# Patient Record
Sex: Male | Born: 1980 | Race: Black or African American | Hispanic: No | Marital: Married | Smoking: Current every day smoker
Health system: Southern US, Community
[De-identification: ages and names within clinical notes are randomized; demographics above are authoritative.]

## PROBLEM LIST (undated history)

## (undated) DIAGNOSIS — Z969 Presence of functional implant, unspecified: Secondary | ICD-10-CM

## (undated) DIAGNOSIS — R569 Unspecified convulsions: Secondary | ICD-10-CM

## (undated) DIAGNOSIS — F32A Depression, unspecified: Secondary | ICD-10-CM

## (undated) DIAGNOSIS — W3400XA Accidental discharge from unspecified firearms or gun, initial encounter: Secondary | ICD-10-CM

## (undated) DIAGNOSIS — I10 Essential (primary) hypertension: Secondary | ICD-10-CM

## (undated) DIAGNOSIS — I639 Cerebral infarction, unspecified: Secondary | ICD-10-CM

## (undated) DIAGNOSIS — F1010 Alcohol abuse, uncomplicated: Secondary | ICD-10-CM

## (undated) DIAGNOSIS — R440 Auditory hallucinations: Secondary | ICD-10-CM

## (undated) DIAGNOSIS — F319 Bipolar disorder, unspecified: Secondary | ICD-10-CM

## (undated) DIAGNOSIS — R479 Unspecified speech disturbances: Secondary | ICD-10-CM

## (undated) DIAGNOSIS — F329 Major depressive disorder, single episode, unspecified: Secondary | ICD-10-CM

---

## 1998-02-27 ENCOUNTER — Emergency Department (HOSPITAL_COMMUNITY): Admission: EM | Admit: 1998-02-27 | Discharge: 1998-02-27 | Payer: Self-pay | Admitting: Emergency Medicine

## 1998-07-30 ENCOUNTER — Emergency Department (HOSPITAL_COMMUNITY): Admission: EM | Admit: 1998-07-30 | Discharge: 1998-07-30 | Payer: Self-pay | Admitting: Emergency Medicine

## 1998-12-26 ENCOUNTER — Encounter: Payer: Self-pay | Admitting: Emergency Medicine

## 1998-12-26 ENCOUNTER — Emergency Department (HOSPITAL_COMMUNITY): Admission: EM | Admit: 1998-12-26 | Discharge: 1998-12-26 | Payer: Self-pay | Admitting: Emergency Medicine

## 1999-01-09 ENCOUNTER — Encounter: Admission: RE | Admit: 1999-01-09 | Discharge: 1999-01-09 | Payer: Self-pay | Admitting: *Deleted

## 1999-01-16 ENCOUNTER — Emergency Department (HOSPITAL_COMMUNITY): Admission: EM | Admit: 1999-01-16 | Discharge: 1999-01-16 | Payer: Self-pay | Admitting: *Deleted

## 1999-02-26 ENCOUNTER — Emergency Department (HOSPITAL_COMMUNITY): Admission: EM | Admit: 1999-02-26 | Discharge: 1999-02-26 | Payer: Self-pay | Admitting: Emergency Medicine

## 1999-04-09 ENCOUNTER — Encounter: Payer: Self-pay | Admitting: Emergency Medicine

## 1999-04-09 ENCOUNTER — Emergency Department (HOSPITAL_COMMUNITY): Admission: EM | Admit: 1999-04-09 | Discharge: 1999-04-09 | Payer: Self-pay | Admitting: Emergency Medicine

## 1999-06-20 ENCOUNTER — Encounter: Payer: Self-pay | Admitting: Emergency Medicine

## 1999-06-20 ENCOUNTER — Emergency Department (HOSPITAL_COMMUNITY): Admission: EM | Admit: 1999-06-20 | Discharge: 1999-06-21 | Payer: Self-pay | Admitting: Emergency Medicine

## 1999-07-16 ENCOUNTER — Emergency Department (HOSPITAL_COMMUNITY): Admission: EM | Admit: 1999-07-16 | Discharge: 1999-07-17 | Payer: Self-pay | Admitting: Emergency Medicine

## 1999-07-17 ENCOUNTER — Encounter: Payer: Self-pay | Admitting: Emergency Medicine

## 1999-08-06 ENCOUNTER — Encounter: Payer: Self-pay | Admitting: Emergency Medicine

## 1999-08-06 ENCOUNTER — Emergency Department (HOSPITAL_COMMUNITY): Admission: EM | Admit: 1999-08-06 | Discharge: 1999-08-06 | Payer: Self-pay | Admitting: Emergency Medicine

## 1999-08-20 ENCOUNTER — Encounter: Admission: RE | Admit: 1999-08-20 | Discharge: 1999-08-20 | Payer: Self-pay | Admitting: Internal Medicine

## 1999-12-04 ENCOUNTER — Encounter: Payer: Self-pay | Admitting: Emergency Medicine

## 1999-12-04 ENCOUNTER — Emergency Department (HOSPITAL_COMMUNITY): Admission: EM | Admit: 1999-12-04 | Discharge: 1999-12-04 | Payer: Self-pay | Admitting: *Deleted

## 2002-06-01 ENCOUNTER — Emergency Department (HOSPITAL_COMMUNITY): Admission: EM | Admit: 2002-06-01 | Discharge: 2002-06-01 | Payer: Self-pay | Admitting: Emergency Medicine

## 2002-06-01 ENCOUNTER — Encounter: Payer: Self-pay | Admitting: Emergency Medicine

## 2002-07-11 ENCOUNTER — Encounter: Payer: Self-pay | Admitting: Emergency Medicine

## 2002-07-11 ENCOUNTER — Emergency Department (HOSPITAL_COMMUNITY): Admission: AC | Admit: 2002-07-11 | Discharge: 2002-07-11 | Payer: Self-pay

## 2005-09-27 ENCOUNTER — Emergency Department (HOSPITAL_COMMUNITY): Admission: EM | Admit: 2005-09-27 | Discharge: 2005-09-27 | Payer: Self-pay | Admitting: Emergency Medicine

## 2006-01-03 ENCOUNTER — Emergency Department (HOSPITAL_COMMUNITY): Admission: EM | Admit: 2006-01-03 | Discharge: 2006-01-03 | Payer: Self-pay | Admitting: Emergency Medicine

## 2006-11-10 ENCOUNTER — Emergency Department (HOSPITAL_COMMUNITY): Admission: EM | Admit: 2006-11-10 | Discharge: 2006-11-11 | Payer: Self-pay | Admitting: Emergency Medicine

## 2007-01-10 ENCOUNTER — Emergency Department (HOSPITAL_COMMUNITY): Admission: EM | Admit: 2007-01-10 | Discharge: 2007-01-10 | Payer: Self-pay | Admitting: Family Medicine

## 2009-03-22 ENCOUNTER — Emergency Department (HOSPITAL_COMMUNITY): Admission: EM | Admit: 2009-03-22 | Discharge: 2009-03-22 | Payer: Self-pay | Admitting: Emergency Medicine

## 2009-04-28 ENCOUNTER — Emergency Department (HOSPITAL_COMMUNITY): Admission: EM | Admit: 2009-04-28 | Discharge: 2009-04-29 | Payer: Self-pay | Admitting: *Deleted

## 2009-05-02 ENCOUNTER — Emergency Department (HOSPITAL_COMMUNITY): Admission: EM | Admit: 2009-05-02 | Discharge: 2009-05-02 | Payer: Self-pay | Admitting: Family Medicine

## 2010-08-11 ENCOUNTER — Emergency Department (HOSPITAL_COMMUNITY): Admission: EM | Admit: 2010-08-11 | Discharge: 2010-08-12 | Payer: Self-pay

## 2010-12-04 ENCOUNTER — Emergency Department (HOSPITAL_COMMUNITY)
Admission: EM | Admit: 2010-12-04 | Discharge: 2010-12-04 | Payer: Medicaid Other | Source: Home / Self Care | Admitting: Emergency Medicine

## 2011-01-30 LAB — POCT I-STAT, CHEM 8
Calcium, Ion: 0.94 mmol/L — ABNORMAL LOW (ref 1.12–1.32)
Creatinine, Ser: 1.3 mg/dL (ref 0.4–1.5)
Hemoglobin: 15.6 g/dL (ref 13.0–17.0)
Sodium: 145 meq/L (ref 135–145)
TCO2: 23 mmol/L (ref 0–100)

## 2011-01-30 LAB — RAPID URINE DRUG SCREEN, HOSP PERFORMED
Amphetamines: NOT DETECTED
Barbiturates: NOT DETECTED
Tetrahydrocannabinol: NOT DETECTED

## 2011-01-30 LAB — URINALYSIS, ROUTINE W REFLEX MICROSCOPIC
Bilirubin Urine: NEGATIVE
Hgb urine dipstick: NEGATIVE
Ketones, ur: NEGATIVE mg/dL
Protein, ur: NEGATIVE mg/dL
Urobilinogen, UA: 0.2 mg/dL (ref 0.0–1.0)

## 2011-01-30 LAB — DIFFERENTIAL
Basophils Absolute: 0 10*3/uL (ref 0.0–0.1)
Eosinophils Relative: 1 % (ref 0–5)
Lymphocytes Relative: 36 % (ref 12–46)
Neutro Abs: 3.6 10*3/uL (ref 1.7–7.7)

## 2011-01-30 LAB — CBC
Platelets: 211 10*3/uL (ref 150–400)
RDW: 13.9 % (ref 11.5–15.5)
WBC: 6.6 10*3/uL (ref 4.0–10.5)

## 2011-01-30 LAB — APTT: aPTT: 29 s (ref 24–37)

## 2011-01-30 LAB — ETHANOL: Alcohol, Ethyl (B): 369 mg/dL — ABNORMAL HIGH (ref 0–10)

## 2011-01-30 LAB — PROTIME-INR: Prothrombin Time: 13.1 s (ref 11.6–15.2)

## 2011-02-24 LAB — GC/CHLAMYDIA PROBE AMP, GENITAL
Chlamydia, DNA Probe: NEGATIVE
Chlamydia, DNA Probe: POSITIVE — AB
GC Probe Amp, Genital: NEGATIVE
GC Probe Amp, Genital: POSITIVE — AB

## 2011-04-04 ENCOUNTER — Emergency Department (HOSPITAL_COMMUNITY)
Admission: EM | Admit: 2011-04-04 | Discharge: 2011-04-04 | Disposition: A | Discharge status: Home / Self Care | Payer: Self-pay | Attending: Emergency Medicine | Admitting: Emergency Medicine

## 2011-04-04 DIAGNOSIS — F1010 Alcohol abuse, uncomplicated: Secondary | ICD-10-CM | POA: Insufficient documentation

## 2011-04-04 DIAGNOSIS — R4182 Altered mental status, unspecified: Secondary | ICD-10-CM | POA: Insufficient documentation

## 2011-04-04 LAB — COMPREHENSIVE METABOLIC PANEL
ALT: 24 U/L (ref 0–53)
Alkaline Phosphatase: 77 U/L (ref 39–117)
BUN: 15 mg/dL (ref 6–23)
CO2: 19 mEq/L (ref 19–32)
Calcium: 8.7 mg/dL (ref 8.4–10.5)
GFR calc non Af Amer: >60 mL/min (ref >60)
Glucose, Bld: 76 mg/dL (ref 70–99)
Total Protein: 7.5 g/dL (ref 6.0–8.3)

## 2011-04-04 LAB — CBC
HCT: 40.4 % (ref 39.0–52.0)
Hemoglobin: 13.8 g/dL (ref 13.0–17.0)
MCHC: 34.2 g/dL (ref 30.0–36.0)
MCV: 87.3 fL (ref 78.0–100.0)
RDW: 13.8 % (ref 11.5–15.5)

## 2011-04-04 LAB — DIFFERENTIAL
Eosinophils Relative: 3 % (ref 0–5)
Lymphocytes Relative: 28 % (ref 12–46)
Lymphs Abs: 2 10*3/uL (ref 0.7–4.0)
Monocytes Absolute: 0.7 10*3/uL (ref 0.1–1.0)
Monocytes Relative: 10 % (ref 3–12)
Neutro Abs: 4.1 10*3/uL (ref 1.7–7.7)

## 2011-04-04 LAB — ETHANOL: Alcohol, Ethyl (B): 313 mg/dL — ABNORMAL HIGH (ref 0–10)

## 2011-04-25 ENCOUNTER — Inpatient Hospital Stay (INDEPENDENT_AMBULATORY_CARE_PROVIDER_SITE_OTHER)
Admission: RE | Admit: 2011-04-25 | Discharge: 2011-04-25 | Disposition: A | Discharge status: Home / Self Care | Payer: Self-pay | Source: Ambulatory Visit | Attending: Family Medicine | Admitting: Family Medicine

## 2011-04-25 DIAGNOSIS — H6120 Impacted cerumen, unspecified ear: Secondary | ICD-10-CM

## 2011-07-20 ENCOUNTER — Emergency Department (HOSPITAL_COMMUNITY): Payer: Self-pay

## 2011-07-20 ENCOUNTER — Emergency Department (HOSPITAL_COMMUNITY)
Admission: EM | Admit: 2011-07-20 | Discharge: 2011-07-20 | Disposition: A | Discharge status: Home / Self Care | Payer: No Typology Code available for payment source | Attending: General Surgery | Admitting: General Surgery

## 2011-07-20 DIAGNOSIS — M25559 Pain in unspecified hip: Secondary | ICD-10-CM | POA: Insufficient documentation

## 2011-07-20 DIAGNOSIS — M546 Pain in thoracic spine: Secondary | ICD-10-CM | POA: Insufficient documentation

## 2011-07-20 DIAGNOSIS — M25519 Pain in unspecified shoulder: Secondary | ICD-10-CM | POA: Insufficient documentation

## 2011-07-20 DIAGNOSIS — I10 Essential (primary) hypertension: Secondary | ICD-10-CM | POA: Insufficient documentation

## 2011-07-20 DIAGNOSIS — IMO0002 Reserved for concepts with insufficient information to code with codable children: Secondary | ICD-10-CM | POA: Insufficient documentation

## 2011-07-20 DIAGNOSIS — S20219A Contusion of unspecified front wall of thorax, initial encounter: Secondary | ICD-10-CM | POA: Insufficient documentation

## 2011-07-20 DIAGNOSIS — M542 Cervicalgia: Secondary | ICD-10-CM | POA: Insufficient documentation

## 2011-07-20 DIAGNOSIS — R079 Chest pain, unspecified: Secondary | ICD-10-CM | POA: Insufficient documentation

## 2011-07-20 DIAGNOSIS — R109 Unspecified abdominal pain: Secondary | ICD-10-CM | POA: Insufficient documentation

## 2011-07-20 DIAGNOSIS — S7000XA Contusion of unspecified hip, initial encounter: Secondary | ICD-10-CM | POA: Insufficient documentation

## 2011-07-20 DIAGNOSIS — S0990XA Unspecified injury of head, initial encounter: Secondary | ICD-10-CM | POA: Insufficient documentation

## 2011-07-20 DIAGNOSIS — R51 Headache: Secondary | ICD-10-CM | POA: Insufficient documentation

## 2011-07-20 LAB — PROTIME-INR
INR: 0.96 (ref 0.00–1.49)
Prothrombin Time: 13 seconds (ref 11.6–15.2)

## 2011-07-20 LAB — URINALYSIS, ROUTINE W REFLEX MICROSCOPIC
Bilirubin Urine: NEGATIVE
Glucose, UA: NEGATIVE mg/dL
Ketones, ur: NEGATIVE mg/dL
Leukocytes, UA: NEGATIVE
Protein, ur: NEGATIVE mg/dL
pH: 5 (ref 5.0–8.0)

## 2011-07-20 LAB — POCT I-STAT, CHEM 8
BUN: 10 mg/dL (ref 6–23)
Creatinine, Ser: 1.5 mg/dL — ABNORMAL HIGH (ref 0.50–1.35)
Glucose, Bld: 97 mg/dL (ref 70–99)
Hemoglobin: 13.6 g/dL (ref 13.0–17.0)
Potassium: 3.8 meq/L (ref 3.5–5.1)
TCO2: 22 mmol/L (ref 0–100)

## 2011-07-20 LAB — COMPREHENSIVE METABOLIC PANEL
ALT: 25 U/L (ref 0–53)
AST: 41 U/L — ABNORMAL HIGH (ref 0–37)
Albumin: 4.1 g/dL (ref 3.5–5.2)
CO2: 25 mEq/L (ref 19–32)
Calcium: 8.4 mg/dL (ref 8.4–10.5)
Creatinine, Ser: 0.91 mg/dL (ref 0.50–1.35)
GFR calc non Af Amer: >60 mL/min (ref >60)
Sodium: 140 mEq/L (ref 135–145)
Total Protein: 7.1 g/dL (ref 6.0–8.3)

## 2011-07-20 LAB — CBC
Hemoglobin: 12.7 g/dL — ABNORMAL LOW (ref 13.0–17.0)
MCH: 31.8 pg (ref 26.0–34.0)
MCHC: 35.8 g/dL (ref 30.0–36.0)
MCV: 88.8 fL (ref 78.0–100.0)

## 2011-07-20 LAB — LACTIC ACID, PLASMA: Lactic Acid, Venous: 1.2 mmol/L (ref 0.5–2.2)

## 2011-07-20 LAB — URINE MICROSCOPIC-ADD ON

## 2011-07-20 LAB — ETHANOL: Alcohol, Ethyl (B): 329 mg/dL — ABNORMAL HIGH (ref 0–11)

## 2011-07-20 MED ORDER — IOHEXOL 300 MG/ML  SOLN
100.0000 mL | Freq: Once | INTRAMUSCULAR | Status: AC | PRN
  Administered 2011-07-20: 100 mL via INTRAVENOUS

## 2011-11-25 ENCOUNTER — Encounter: Payer: Self-pay | Admitting: Emergency Medicine

## 2011-11-25 ENCOUNTER — Emergency Department (HOSPITAL_COMMUNITY)
Admission: EM | Admit: 2011-11-25 | Discharge: 2011-11-26 | Disposition: A | Discharge status: Home / Self Care | Payer: Self-pay | Attending: Emergency Medicine | Admitting: Emergency Medicine

## 2011-11-25 DIAGNOSIS — M79609 Pain in unspecified limb: Secondary | ICD-10-CM | POA: Insufficient documentation

## 2011-11-25 DIAGNOSIS — S60222A Contusion of left hand, initial encounter: Secondary | ICD-10-CM

## 2011-11-25 DIAGNOSIS — S60229A Contusion of unspecified hand, initial encounter: Secondary | ICD-10-CM | POA: Insufficient documentation

## 2011-11-25 DIAGNOSIS — W2209XA Striking against other stationary object, initial encounter: Secondary | ICD-10-CM | POA: Insufficient documentation
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
## 2011-11-26 ENCOUNTER — Emergency Department (HOSPITAL_COMMUNITY): Payer: Self-pay

## 2011-11-26 NOTE — ED Provider Notes (Signed)
Medical screening examination/treatment/procedure(s) were performed by non-physician practitioner and as supervising physician I was immediately available for consultation/collaboration.   Vida Roller, MD 11/26/11 2229
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
## 2012-02-23 ENCOUNTER — Emergency Department (HOSPITAL_COMMUNITY)
Admission: EM | Admit: 2012-02-23 | Discharge: 2012-02-23 | Disposition: A | Discharge status: Home / Self Care | Payer: No Typology Code available for payment source | Attending: Emergency Medicine | Admitting: Emergency Medicine

## 2012-02-23 ENCOUNTER — Encounter (HOSPITAL_COMMUNITY): Payer: Self-pay | Admitting: Emergency Medicine

## 2012-02-23 DIAGNOSIS — Y9289 Other specified places as the place of occurrence of the external cause: Secondary | ICD-10-CM | POA: Insufficient documentation

## 2012-02-23 DIAGNOSIS — S139XXA Sprain of joints and ligaments of unspecified parts of neck, initial encounter: Secondary | ICD-10-CM | POA: Insufficient documentation

## 2012-02-23 DIAGNOSIS — S161XXA Strain of muscle, fascia and tendon at neck level, initial encounter: Secondary | ICD-10-CM

## 2012-02-23 DIAGNOSIS — T148XXA Other injury of unspecified body region, initial encounter: Secondary | ICD-10-CM

## 2012-02-23 MED ORDER — IBUPROFEN 800 MG PO TABS: 800.0000 mg | ORAL_TABLET | Freq: Three times a day (TID) | ORAL | Status: AC

## 2012-02-23 MED ORDER — ACETAMINOPHEN-CODEINE #3 300-30 MG PO TABS: 1.0000 | ORAL_TABLET | Freq: Four times a day (QID) | ORAL | Status: AC | PRN

## 2012-02-23 MED ORDER — DIAZEPAM 5 MG PO TABS
5.0000 mg | ORAL_TABLET | Freq: Once | ORAL | Status: AC
Start: 2012-02-23 — End: 2012-02-23
  Administered 2012-02-23: 5 mg via ORAL
  Filled 2012-02-23: qty 1

## 2012-02-23 MED ORDER — ACETAMINOPHEN-CODEINE #3 300-30 MG PO TABS
1.0000 | ORAL_TABLET | Freq: Once | ORAL | Status: AC
  Administered 2012-02-23: 1 via ORAL
  Filled 2012-02-23: qty 1

## 2012-02-23 MED ORDER — DIAZEPAM 5 MG PO TABS: 5.0000 mg | ORAL_TABLET | Freq: Two times a day (BID) | ORAL | Status: AC

## 2012-02-23 MED ORDER — IBUPROFEN 800 MG PO TABS: 800.0000 mg | ORAL_TABLET | Freq: Three times a day (TID) | ORAL | Status: DC
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
## 2012-02-23 NOTE — Discharge Instructions (Signed)
Take ibuprofen as directed for inflammation and pain with tylenol #3 for breakthrough pain and valium for muscle relaxation but do not drive or operate machinery with tylenol #3 or valium use. Ice to areas of soreness for the next few days and then may move to heat. Expect to be sore for the next few day and follow up with primary care physician for recheck of ongoing symptoms but return to ER for emergent changing or worsening of symptoms.    Cervical Sprain A cervical sprain is an injury in the neck in which the ligaments are stretched or torn. The ligaments are the tissues that hold the bones of the neck (vertebrae) in place.Cervical sprains can range from very mild to very severe. Most cervical sprains get better in 1 to 3 weeks, but it depends on the cause and extent of the injury. Severe cervical sprains can cause the neck vertebrae to be unstable. This can lead to damage of the spinal cord and can result in serious nervous system problems. Your caregiver will determine whether your cervical sprain is mild or severe. CAUSES  Severe cervical sprains may be caused by:  Contact sport injuries (football, rugby, wrestling, hockey, auto racing, gymnastics, diving, martial arts, boxing).   Motor vehicle collisions.   Whiplash injuries. This means the neck is forcefully whipped backward and forward.   Falls.  Mild cervical sprains may be caused by:   Awkward positions, such as cradling a telephone between your ear and shoulder.   Sitting in a chair that does not offer proper support.   Working at a poorly Marketing executive station.   Activities that require looking up or down for long periods of time.  SYMPTOMS   Pain, soreness, stiffness, or a burning sensation in the front, back, or sides of the neck. This discomfort may develop immediately after injury or it may develop slowly and not begin for 24 hours or more after an injury.   Pain or tenderness directly in the middle of the back of  the neck.   Shoulder or upper back pain.   Limited ability to move the neck.   Headache.   Dizziness.   Weakness, numbness, or tingling in the hands or arms.   Muscle spasms.   Difficulty swallowing or chewing.   Tenderness and swelling of the neck.  DIAGNOSIS  Most of the time, your caregiver can diagnose this problem by taking your history and doing a physical exam. Your caregiver will ask about any known problems, such as arthritis in the neck or a previous neck injury. X-rays may be taken to find out if there are any other problems, such as problems with the bones of the neck. However, an X-ray often does not reveal the full extent of a cervical sprain. Other tests such as a computed tomography (CT) scan or magnetic resonance imaging (MRI) may be needed. TREATMENT  Treatment depends on the severity of the cervical sprain. Mild sprains can be treated with rest, keeping the neck in place (immobilization), and pain medicines. Severe cervical sprains need immediate immobilization and an appointment with an orthopedist or neurosurgeon. Several treatment options are available to help with pain, muscle spasms, and other symptoms. Your caregiver may prescribe:  Medicines, such as pain relievers, numbing medicines, or muscle relaxants.   Physical therapy. This can include stretching exercises, strengthening exercises, and posture training. Exercises and improved posture can help stabilize the neck, strengthen muscles, and help stop symptoms from returning.   A neck collar to  be worn for short periods of time. Often, these collars are worn for comfort. However, certain collars may be worn to protect the neck and prevent further worsening of a serious cervical sprain.  HOME CARE INSTRUCTIONS   Put ice on the injured area.   Put ice in a plastic bag.   Place a towel between your skin and the bag.   Leave the ice on for 15 to 20 minutes, 3 to 4 times a day.   Only take over-the-counter or  prescription medicines for pain, discomfort, or fever as directed by your caregiver.   Keep all follow-up appointments as directed by your caregiver.   Keep all physical therapy appointments as directed by your caregiver.   If a neck collar is prescribed, wear it as directed by your caregiver.   Do not drive while wearing a neck collar.   Make any needed adjustments to your work station to promote good posture.   Avoid positions and activities that make your symptoms worse.   Warm up and stretch before being active to help prevent problems.  SEEK MEDICAL CARE IF:   Your pain is not controlled with medicine.   You are unable to decrease your pain medicine over time as planned.   Your activity level is not improving as expected.  SEEK IMMEDIATE MEDICAL CARE IF:   You develop any bleeding, stomach upset, or signs of an allergic reaction to your medicine.   Your symptoms get worse.   You develop new, unexplained symptoms.   You have numbness, tingling, weakness, or paralysis in any part of your body.  MAKE SURE YOU:   Understand these instructions.   Will watch your condition.   Will get help right away if you are not doing well or get worse.  Document Released: 08/31/2007 Document Revised: 10/23/2011 Document Reviewed: 08/06/2011 [facility] Patient Information 2012 [place], [place].

## 2012-02-23 NOTE — ED Provider Notes (Signed)
Medical screening examination/treatment/procedure(s) were performed by non-physician practitioner and as supervising physician I was immediately available for consultation/collaboration.  Doug Sou, MD 02/23/12 2325
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
## 2012-04-19 ENCOUNTER — Emergency Department (INDEPENDENT_AMBULATORY_CARE_PROVIDER_SITE_OTHER)
Admission: EM | Admit: 2012-04-19 | Discharge: 2012-04-19 | Disposition: A | Discharge status: Home / Self Care | Payer: Self-pay | Source: Home / Self Care | Attending: Family Medicine | Admitting: Family Medicine

## 2012-04-19 ENCOUNTER — Encounter (HOSPITAL_COMMUNITY): Payer: Self-pay | Admitting: *Deleted

## 2012-04-19 DIAGNOSIS — Z202 Contact with and (suspected) exposure to infections with a predominantly sexual mode of transmission: Secondary | ICD-10-CM

## 2012-04-19 DIAGNOSIS — M545 Low back pain: Secondary | ICD-10-CM

## 2012-04-19 DIAGNOSIS — Z2089 Contact with and (suspected) exposure to other communicable diseases: Secondary | ICD-10-CM

## 2012-04-19 DIAGNOSIS — M7918 Myalgia, other site: Secondary | ICD-10-CM

## 2012-04-19 LAB — POCT URINALYSIS DIP (DEVICE)
Glucose, UA: NEGATIVE mg/dL
Hgb urine dipstick: NEGATIVE
Ketones, ur: NEGATIVE mg/dL
Specific Gravity, Urine: >=1.03 (ref 1.005–1.030)
Urobilinogen, UA: 2 mg/dL — ABNORMAL HIGH (ref 0.0–1.0)

## 2012-04-19 MED ORDER — CEFTRIAXONE SODIUM 250 MG IJ SOLR
250.0000 mg | Freq: Once | INTRAMUSCULAR | Status: AC
  Administered 2012-04-19: 250 mg via INTRAMUSCULAR

## 2012-04-19 MED ORDER — CEFTRIAXONE SODIUM 250 MG IJ SOLR
INTRAMUSCULAR | Status: AC
  Filled 2012-04-19: qty 250

## 2012-04-19 MED ORDER — AZITHROMYCIN 250 MG PO TABS
ORAL_TABLET | ORAL | Status: AC
  Filled 2012-04-19: qty 4

## 2012-04-19 MED ORDER — AZITHROMYCIN 250 MG PO TABS
1000.0000 mg | ORAL_TABLET | Freq: Once | ORAL | Status: AC
  Administered 2012-04-19: 1000 mg via ORAL
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
## 2012-04-19 NOTE — Discharge Instructions (Signed)
Always use condoms to help reduce the risk of getting a STD.  Make sure to tell your partner that you were treated for STDs because they need to be checked and treated too.   Try soaking in a warm bath with epsom salt in it to help relieve your back pain.  Try back exercises to help your back get better.   Back Exercises Back exercises help treat and prevent back injuries. The goal is to increase your strength in your belly (abdominal) and back muscles. These exercises can also help with flexibility. Start these exercises when told by your doctor. HOME CARE Back exercises include: Pelvic Tilt.  Lie on your back with your knees bent. Tilt your pelvis until the lower part of your back is against the floor. Hold this position 5 to 10 sec. Repeat this exercise 5 to 10 times.  Knee to Chest.  Pull 1 knee up against your chest and hold for 20 to 30 seconds. Repeat this with the other knee. This may be done with the other leg straight or bent, whichever feels better. Then, pull both knees up against your chest.  Sit-Ups or Curl-Ups.  Bend your knees 90 degrees. Start with tilting your pelvis, and do a partial, slow sit-up. Only lift your upper half 30 to 45 degrees off the floor. Take at least 2 to 3 seonds for each sit-up. Do not do sit-ups with your knees out straight. If partial sit-ups are difficult, simply do the above but with only tightening your belly (abdominal) muscles and holding it as told.  Hip-Lift.  Lie on your back with your knees flexed 90 degrees. Push down with your feet and shoulders as you raise your hips 2 inches off the floor. Hold for 10 seconds, repeat 5 to 10 times.  Back Arches.  Lie on your stomach. Prop yourself up on bent elbows. Slowly press on your hands, causing an arch in your low back. Repeat 3 to 5 times.  Shoulder-Lifts.  Lie face down with arms beside your body. Keep hips and belly pressed to floor as you slowly lift your head and shoulders off the floor.  Do  not overdo your exercises. Be careful in the beginning. Exercises may cause you some mild back discomfort. If the pain lasts for more than 15 minutes, stop the exercises until you see your doctor. Improvement with exercise for back problems is slow.  Document Released: 12/06/2010 Document Revised: 10/23/2011 Document Reviewed: 12/06/2010 [facility] Patient Information 2012 [place], [place].

## 2012-04-19 NOTE — ED Provider Notes (Signed)
Medical screening examination/treatment/procedure(s) were performed by non-physician practitioner and as supervising physician I was immediately available for consultation/collaboration.   [facility]; MD   Sharin Grave, MD 04/19/12 1550

## 2012-04-20 LAB — GC/CHLAMYDIA PROBE AMP, GENITAL: Chlamydia, DNA Probe: NEGATIVE

## 2012-04-29 ENCOUNTER — Telehealth (HOSPITAL_COMMUNITY): Payer: Self-pay | Admitting: *Deleted

## 2012-04-29 NOTE — ED Notes (Addendum)
Pt. called in for his lab results. Pt. verified x 2 and given neg. GC/Chlamydia results.  Pt. states a previous partner called him and said she had trich and he needs to get treated. He said he was tested and was neg. I explained that we did not test him for trich.  He states his current partner was seen here 6/3 and she did not have trich, so he does not think he has it.  Discussed with Dr. Ladon Applebaum.  He said he should get treated if he had a positive exposure.  He said there are a lot of false negatives for this test and that is why positive exposures are treated. I gave this information to him. He thinks this other previous partner is lying. I told him the safe thing to do is get treated. Vassie Moselle 04/29/2012

## 2012-08-17 ENCOUNTER — Encounter (HOSPITAL_COMMUNITY): Payer: Self-pay | Admitting: Emergency Medicine

## 2012-08-17 ENCOUNTER — Emergency Department (HOSPITAL_COMMUNITY)
Admission: EM | Admit: 2012-08-17 | Discharge: 2012-08-18 | Disposition: A | Discharge status: Home / Self Care | Payer: Self-pay | Attending: Otolaryngology | Admitting: Otolaryngology

## 2012-08-17 DIAGNOSIS — F17200 Nicotine dependence, unspecified, uncomplicated: Secondary | ICD-10-CM | POA: Insufficient documentation

## 2012-08-17 DIAGNOSIS — S02609A Fracture of mandible, unspecified, initial encounter for closed fracture: Secondary | ICD-10-CM

## 2012-08-17 DIAGNOSIS — S02650A Fracture of angle of mandible, unspecified side, initial encounter for closed fracture: Secondary | ICD-10-CM | POA: Insufficient documentation
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
## 2012-08-18 ENCOUNTER — Encounter (HOSPITAL_COMMUNITY): Admission: EM | Disposition: A | Discharge status: Home / Self Care | Payer: Self-pay | Source: Home / Self Care

## 2012-08-18 ENCOUNTER — Encounter (HOSPITAL_COMMUNITY): Payer: Self-pay | Admitting: Anesthesiology

## 2012-08-18 ENCOUNTER — Emergency Department (HOSPITAL_COMMUNITY): Payer: Self-pay

## 2012-08-18 ENCOUNTER — Emergency Department (HOSPITAL_COMMUNITY): Payer: Self-pay | Admitting: Anesthesiology

## 2012-08-18 HISTORY — PX: ORIF MANDIBULAR FRACTURE: SHX2127

## 2012-08-18 SURGERY — OPEN REDUCTION INTERNAL FIXATION (ORIF) MANDIBULAR FRACTURE
Anesthesia: General | Site: Mouth | Wound class: Clean Contaminated

## 2012-08-18 SURGERY — Surgical Case
Anesthesia: *Unknown

## 2012-08-18 MED ORDER — 0.9 % SODIUM CHLORIDE (POUR BTL) OPTIME
TOPICAL | Status: DC | PRN
  Administered 2012-08-18: 1000 mL

## 2012-08-18 MED ORDER — ONDANSETRON HCL 4 MG/2ML IJ SOLN
INTRAMUSCULAR | Status: DC | PRN
  Administered 2012-08-18: 4 mg via INTRAVENOUS

## 2012-08-18 MED ORDER — CHLORHEXIDINE GLUCONATE 0.12 % MT SOLN
OROMUCOSAL | Status: DC | PRN
  Administered 2012-08-18: 30 mL via OROMUCOSAL

## 2012-08-18 MED ORDER — CHLORHEXIDINE GLUCONATE 0.12 % MT SOLN
473.0000 mL | OROMUCOSAL | Status: DC
  Filled 2012-08-18: qty 473

## 2012-08-18 MED ORDER — MIDAZOLAM HCL 5 MG/5ML IJ SOLN
INTRAMUSCULAR | Status: DC | PRN
  Administered 2012-08-18: 1 mg via INTRAVENOUS

## 2012-08-18 MED ORDER — LIDOCAINE HCL (CARDIAC) 20 MG/ML IV SOLN
INTRAVENOUS | Status: DC | PRN
  Administered 2012-08-18: 20 mg via INTRAVENOUS

## 2012-08-18 MED ORDER — SODIUM CHLORIDE 0.9 % IV SOLN
Freq: Once | INTRAVENOUS | Status: AC
  Administered 2012-08-18: 20 mL/h via INTRAVENOUS

## 2012-08-18 MED ORDER — PROPOFOL 10 MG/ML IV BOLUS
INTRAVENOUS | Status: DC | PRN
  Administered 2012-08-18: 200 mg via INTRAVENOUS

## 2012-08-18 MED ORDER — OXYCODONE-ACETAMINOPHEN 5-325 MG/5ML PO SOLN: 5.0000 mL | ORAL | Status: DC | PRN

## 2012-08-18 MED ORDER — HYDROMORPHONE HCL PF 1 MG/ML IJ SOLN
1.0000 mg | Freq: Once | INTRAMUSCULAR | Status: AC
  Administered 2012-08-18: 1 mg via INTRAVENOUS
  Filled 2012-08-18: qty 1

## 2012-08-18 MED ORDER — LIDOCAINE-EPINEPHRINE 1 %-1:100000 IJ SOLN
INTRAMUSCULAR | Status: DC | PRN
  Administered 2012-08-18: 2 mL

## 2012-08-18 MED ORDER — OXYMETAZOLINE HCL 0.05 % NA SOLN
NASAL | Status: DC | PRN
  Administered 2012-08-18: 4 via NASAL

## 2012-08-18 MED ORDER — PROMETHAZINE HCL 25 MG/ML IJ SOLN: 6.2500 mg | Freq: Once | INTRAMUSCULAR | Status: DC

## 2012-08-18 MED ORDER — LACTATED RINGERS IV SOLN
INTRAVENOUS | Status: DC | PRN
  Administered 2012-08-18 (×2): via INTRAVENOUS

## 2012-08-18 MED ORDER — PHENYLEPHRINE HCL 10 MG/ML IJ SOLN
INTRAMUSCULAR | Status: DC | PRN
  Administered 2012-08-18 (×3): 80 ug via INTRAVENOUS

## 2012-08-18 MED ORDER — HYDROMORPHONE HCL PF 1 MG/ML IJ SOLN
INTRAMUSCULAR | Status: AC
  Filled 2012-08-18: qty 1

## 2012-08-18 MED ORDER — CHLORHEXIDINE GLUCONATE 0.12 % MT SOLN
15.0000 mL | OROMUCOSAL | Status: DC
  Filled 2012-08-18: qty 15

## 2012-08-18 MED ORDER — PENICILLIN V POTASSIUM 250 MG/5ML PO SOLR: 500.0000 mg | Freq: Three times a day (TID) | ORAL | Status: AC

## 2012-08-18 MED ORDER — FENTANYL CITRATE 0.05 MG/ML IJ SOLN
INTRAMUSCULAR | Status: DC | PRN
  Administered 2012-08-18: 150 ug via INTRAVENOUS
  Administered 2012-08-18: 50 ug via INTRAVENOUS

## 2012-08-18 MED ORDER — CEFAZOLIN SODIUM 1-5 GM-% IV SOLN
1.0000 g | Freq: Once | INTRAVENOUS | Status: AC
  Administered 2012-08-18: 1 g via INTRAVENOUS
  Filled 2012-08-18: qty 50

## 2012-08-18 MED ORDER — NALOXONE HCL 0.4 MG/ML IJ SOLN
INTRAMUSCULAR | Status: DC | PRN
  Administered 2012-08-18: 40 ug via INTRAVENOUS

## 2012-08-18 MED ORDER — EPHEDRINE SULFATE 50 MG/ML IJ SOLN
INTRAMUSCULAR | Status: DC | PRN
  Administered 2012-08-18: 5 mg via INTRAVENOUS
  Administered 2012-08-18: 10 mg via INTRAVENOUS

## 2012-08-18 MED ORDER — HYDROMORPHONE HCL PF 1 MG/ML IJ SOLN
0.2500 mg | INTRAMUSCULAR | Status: DC | PRN
  Administered 2012-08-18 (×2): 0.5 mg via INTRAVENOUS

## 2012-08-18 SURGICAL SUPPLY — 26 items
CANISTER SUCTION 2500CC (MISCELLANEOUS) ×2 IMPLANT
CLEANER TIP ELECTROSURG 2X2 (MISCELLANEOUS) ×2 IMPLANT
CLOTH BEACON ORANGE TIMEOUT ST (SAFETY) ×2 IMPLANT
ELECT COATED BLADE 2.86 ST (ELECTRODE) ×1 IMPLANT
ELECT REM PT RETURN 9FT ADLT (ELECTROSURGICAL) ×2
ELECTRODE REM PT RTRN 9FT ADLT (ELECTROSURGICAL) ×1 IMPLANT
GLOVE BIOGEL PI IND STRL 6.5 (GLOVE) IMPLANT
GLOVE BIOGEL PI INDICATOR 6.5 (GLOVE) ×1
GLOVE ECLIPSE 7.5 STRL STRAW (GLOVE) ×2 IMPLANT
GLOVE SURG SS PI 6.5 STRL IVOR (GLOVE) ×1 IMPLANT
GOWN STRL NON-REIN LRG LVL3 (GOWN DISPOSABLE) ×4 IMPLANT
KIT BASIN OR (CUSTOM PROCEDURE TRAY) ×2 IMPLANT
KIT ROOM TURNOVER OR (KITS) ×2 IMPLANT
NS IRRIG 1000ML POUR BTL (IV SOLUTION) ×2 IMPLANT
PAD ARMBOARD 7.5X6 YLW CONV (MISCELLANEOUS) ×4 IMPLANT
PENCIL BUTTON HOLSTER BLD 10FT (ELECTRODE) ×2 IMPLANT
PLATE MNDBLE MINI 4H (Plate) ×1 IMPLANT
SCISSORS WIRE ANG 4 3/4 DISP (INSTRUMENTS) ×1 IMPLANT
SCREW LOCKING 2.0X5MM (Screw) ×2 IMPLANT
SCREW MNDBLE 2.0X4 BONE (Screw) ×2 IMPLANT
SCREW UPPER FACE 2.0X8MM (Screw) ×4 IMPLANT
SUT VICRYL 4-0 PS2 18IN ABS (SUTURE) ×1 IMPLANT
TOWEL OR 17X24 6PK STRL BLUE (TOWEL DISPOSABLE) ×2 IMPLANT
TOWEL OR 17X26 10 PK STRL BLUE (TOWEL DISPOSABLE) ×2 IMPLANT
TRAY ENT MC OR (CUSTOM PROCEDURE TRAY) ×2 IMPLANT
WATER STERILE IRR 1000ML POUR (IV SOLUTION) ×2 IMPLANT

## 2012-08-18 NOTE — ED Notes (Signed)
[facility] was called for pt's need of transportation to [facility].
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
## 2012-08-18 NOTE — Brief Op Note (Signed)
08/17/2012 - 08/18/2012  9:20 AM  PATIENT:  Arthur Beasley  31 y.o. male  PRE-OPERATIVE DIAGNOSIS:  Mandible fracture [802.20]  POST-OPERATIVE DIAGNOSIS:  Mandible fracture [802.20]  PROCEDURE:  Procedure(s) (LRB) with comments: OPEN REDUCTION INTERNAL FIXATION (ORIF) MANDIBULAR FRACTURE with mandibular-maxillary fixation (N/A)  SURGEON:  Surgeon(s) and Role:    * Darletta Moll, MD - Primary  PHYSICIAN ASSISTANT:   ASSISTANTS: none   ANESTHESIA:   general  EBL:  Total I/O In: 1000 [I.V.:1000] Out: -   BLOOD ADMINISTERED:none  DRAINS: none   LOCAL MEDICATIONS USED:  LIDOCAINE  and Amount: 2 ml  SPECIMEN:  No Specimen  DISPOSITION OF SPECIMEN:  N/A  COUNTS:  YES  TOURNIQUET:  * No tourniquets in log *  DICTATION: .Other Dictation: Dictation Number [id number]  PLAN OF CARE: Discharge to home after PACU  PATIENT DISPOSITION:  PACU - hemodynamically stable.   Delay start of Pharmacological VTE agent (>24hrs) due to surgical blood loss or risk of bleeding: not applicable

## 2012-08-18 NOTE — Anesthesia Procedure Notes (Addendum)
Procedure Name: Intubation Performed by: Coralee Rud   Date/Time: 08/18/2012 8:13 AM Performed by: Coralee Rud Pre-anesthesia Checklist: Patient identified, Emergency Drugs available, Suction available and Patient being monitored Patient Re-evaluated:Patient Re-evaluated prior to inductionOxygen Delivery Method: Circle system utilized and Simple face mask Preoxygenation: Pre-oxygenation with 100% oxygen Intubation Type: IV induction, Rapid sequence and Cricoid Pressure applied Ventilation: Mask ventilation without difficulty and Nasal airway inserted- appropriate to patient size Nasal Tubes: Nasal Rae and Magill forceps - small, utilized Tube size: 7.0 mm Number of attempts: 1 Placement Confirmation: ETT inserted through vocal cords under direct vision,  breath sounds checked- equal and bilateral and positive ETCO2 Secured at: 26 (At nare) cm Tube secured with: Tape Dental Injury: Teeth and Oropharynx as per pre-operative assessment

## 2012-08-18 NOTE — ED Notes (Signed)
[facility] Transportation here to transport pt to [facility].
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
## 2012-08-18 NOTE — ED Provider Notes (Signed)
MSE was initiated and I personally evaluated the patient and placed orders (if any) at  6:51 AM on August 18, 2012.  The patient appears stable so that the remainder of the MSE may be completed by another provider.  Pt sent from [facility] to see dr Suszanne Conners  D/w dr Suszanne Conners, will take patient to OR Pt stable at this time Ancef ordered and pain meds He has been kept NPO BP 140/95  Pulse 83  Temp 100.2 F (37.9 C) (Oral)  Resp 14  SpO2 97%   Joya Gaskins, MD 08/18/12 [phone]

## 2012-08-18 NOTE — Anesthesia Preprocedure Evaluation (Signed)
Anesthesia Evaluation  Patient identified by MRN, date of birth, ID band Patient awake    Reviewed: Allergy & Precautions, H&P , NPO status , Patient's Chart, lab work & pertinent test results  Airway Mallampati: III TM Distance: >3 FB Neck ROM: Full  Mouth opening: Limited Mouth Opening  Dental No notable dental hx. (+) Dental Advisory Given and Teeth Intact   Pulmonary Current Smoker,  breath sounds clear to auscultation  Pulmonary exam normal       Cardiovascular negative cardio ROS  Rhythm:Regular Rate:Normal     Neuro/Psych negative neurological ROS  negative psych ROS   GI/Hepatic negative GI ROS, Neg liver ROS,   Endo/Other  negative endocrine ROS  Renal/GU negative Renal ROS  negative genitourinary   Musculoskeletal   Abdominal   Peds  Hematology negative hematology ROS (+)   Anesthesia Other Findings   Reproductive/Obstetrics negative OB ROS                           Anesthesia Physical Anesthesia Plan  ASA: II  Anesthesia Plan: General   Post-op Pain Management:    Induction: Intravenous  Airway Management Planned: Video Laryngoscope Planned and Nasal ETT  Additional Equipment:   Intra-op Plan:   Post-operative Plan: Extubation in OR  Informed Consent: I have reviewed the patients History and Physical, chart, labs and discussed the procedure including the risks, benefits and alternatives for the proposed anesthesia with the patient or authorized representative who has indicated his/her understanding and acceptance.   Dental advisory given  Plan Discussed with: CRNA  Anesthesia Plan Comments:         Anesthesia Quick Evaluation

## 2012-08-18 NOTE — ED Notes (Signed)
[facility] was called and notified of pt's need to transfer to [facility] ED, report about pt was also provided.

## 2012-08-18 NOTE — ED Notes (Signed)
PT. TRANSPORTED TO OR WITH DR. Suszanne Conners , REPORT GIVEN TO Santosha Jividen FLORES - NURSE ANESTHETIST , PERSONAL BELONGINGS GIVEN TO MOTHER.

## 2012-08-18 NOTE — Transfer of Care (Signed)
Immediate Anesthesia Transfer of Care Note  Patient: Arthur Beasley  Procedure(s) Performed: Procedure(s) (LRB) with comments: OPEN REDUCTION INTERNAL FIXATION (ORIF) MANDIBULAR FRACTURE (N/A)  Patient Location: PACU  Anesthesia Type: General  Level of Consciousness: awake, alert , oriented and patient cooperative  Airway & Oxygen Therapy: Patient Spontanous Breathing and Patient connected to nasal cannula oxygen  Post-op Assessment: Report given to PACU RN, Post -op Vital signs reviewed and stable, Patient moving all extremities and Patient moving all extremities X 4  Post vital signs: Reviewed and stable  Complications: No apparent anesthesia complications

## 2012-08-18 NOTE — Anesthesia Postprocedure Evaluation (Signed)
  Anesthesia Post-op Note  Patient: Arthur Beasley  Procedure(s) Performed: Procedure(s) (LRB) with comments: OPEN REDUCTION INTERNAL FIXATION (ORIF) MANDIBULAR FRACTURE (N/A)  Patient Location: PACU  Anesthesia Type: General  Level of Consciousness: awake, alert  and oriented  Airway and Oxygen Therapy: Patient Spontanous Breathing  Post-op Pain: mild  Post-op Assessment: Post-op Vital signs reviewed and Patient's Cardiovascular Status Stable  Post-op Vital Signs: stable  Complications: No apparent anesthesia complications
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
## 2012-08-20 ENCOUNTER — Encounter (HOSPITAL_COMMUNITY): Payer: Self-pay | Admitting: Otolaryngology

## 2012-09-21 ENCOUNTER — Encounter (HOSPITAL_BASED_OUTPATIENT_CLINIC_OR_DEPARTMENT_OTHER): Payer: Self-pay | Admitting: *Deleted

## 2012-09-27 ENCOUNTER — Encounter (HOSPITAL_BASED_OUTPATIENT_CLINIC_OR_DEPARTMENT_OTHER): Payer: Self-pay | Admitting: Anesthesiology

## 2012-09-27 ENCOUNTER — Encounter (HOSPITAL_BASED_OUTPATIENT_CLINIC_OR_DEPARTMENT_OTHER): Payer: Self-pay

## 2012-09-27 ENCOUNTER — Encounter (HOSPITAL_BASED_OUTPATIENT_CLINIC_OR_DEPARTMENT_OTHER)
Admission: RE | Disposition: A | Discharge status: Home / Self Care | Payer: Self-pay | Source: Ambulatory Visit | Attending: Otolaryngology

## 2012-09-27 ENCOUNTER — Ambulatory Visit (HOSPITAL_BASED_OUTPATIENT_CLINIC_OR_DEPARTMENT_OTHER): Payer: Self-pay | Admitting: Anesthesiology

## 2012-09-27 ENCOUNTER — Ambulatory Visit (HOSPITAL_BASED_OUTPATIENT_CLINIC_OR_DEPARTMENT_OTHER)
Admission: RE | Admit: 2012-09-27 | Discharge: 2012-09-27 | Disposition: A | Discharge status: Home / Self Care | Payer: Self-pay | Source: Ambulatory Visit | Attending: Otolaryngology | Admitting: Otolaryngology

## 2012-09-27 DIAGNOSIS — S02609A Fracture of mandible, unspecified, initial encounter for closed fracture: Secondary | ICD-10-CM

## 2012-09-27 DIAGNOSIS — IMO0001 Reserved for inherently not codable concepts without codable children: Secondary | ICD-10-CM | POA: Insufficient documentation

## 2012-09-27 DIAGNOSIS — Z4789 Encounter for other orthopedic aftercare: Secondary | ICD-10-CM | POA: Insufficient documentation

## 2012-09-27 HISTORY — DX: Presence of functional implant, unspecified: Z96.9

## 2012-09-27 HISTORY — DX: Depression, unspecified: F32.A

## 2012-09-27 HISTORY — PX: MANDIBULAR HARDWARE REMOVAL: SHX5205

## 2012-09-27 HISTORY — DX: Bipolar disorder, unspecified: F31.9

## 2012-09-27 HISTORY — DX: Major depressive disorder, single episode, unspecified: F32.9

## 2012-09-27 HISTORY — DX: Auditory hallucinations: R44.0

## 2012-09-27 LAB — POCT HEMOGLOBIN-HEMACUE: Hemoglobin: 12.8 g/dL — ABNORMAL LOW (ref 13.0–17.0)

## 2012-09-27 SURGERY — REMOVAL, HARDWARE, MANDIBLE
Anesthesia: Monitor Anesthesia Care | Site: Mouth | Wound class: Clean Contaminated

## 2012-09-27 MED ORDER — LACTATED RINGERS IV SOLN
INTRAVENOUS | Status: DC
  Administered 2012-09-27: 09:00:00 via INTRAVENOUS

## 2012-09-27 MED ORDER — HYDROMORPHONE HCL PF 1 MG/ML IJ SOLN
0.2500 mg | INTRAMUSCULAR | Status: DC | PRN
  Administered 2012-09-27: 0.5 mg via INTRAVENOUS

## 2012-09-27 MED ORDER — ONDANSETRON HCL 4 MG/2ML IJ SOLN
INTRAMUSCULAR | Status: DC | PRN
  Administered 2012-09-27: 4 mg via INTRAVENOUS

## 2012-09-27 MED ORDER — MIDAZOLAM HCL 5 MG/5ML IJ SOLN
INTRAMUSCULAR | Status: DC | PRN
  Administered 2012-09-27: 2 mg via INTRAVENOUS

## 2012-09-27 MED ORDER — LIDOCAINE-EPINEPHRINE 1 %-1:100000 IJ SOLN
INTRAMUSCULAR | Status: DC | PRN
  Administered 2012-09-27: 1 mL

## 2012-09-27 MED ORDER — FENTANYL CITRATE 0.05 MG/ML IJ SOLN
INTRAMUSCULAR | Status: DC | PRN
  Administered 2012-09-27: 100 ug via INTRAVENOUS

## 2012-09-27 MED ORDER — PROPOFOL 10 MG/ML IV EMUL
INTRAVENOUS | Status: DC | PRN
  Administered 2012-09-27: 75 ug/kg/min via INTRAVENOUS

## 2012-09-27 SURGICAL SUPPLY — 31 items
BLADE SURG 15 STRL LF DISP TIS (BLADE) ×1 IMPLANT
BLADE SURG 15 STRL SS (BLADE) ×2
CANISTER SUCTION 1200CC (MISCELLANEOUS) ×2 IMPLANT
CLOTH BEACON ORANGE TIMEOUT ST (SAFETY) ×2 IMPLANT
COVER MAYO STAND STRL (DRAPES) ×2 IMPLANT
DECANTER SPIKE VIAL GLASS SM (MISCELLANEOUS) ×1 IMPLANT
ELECT COATED BLADE 2.86 ST (ELECTRODE) ×2 IMPLANT
ELECT REM PT RETURN 9FT ADLT (ELECTROSURGICAL) ×2
ELECTRODE REM PT RTRN 9FT ADLT (ELECTROSURGICAL) ×1 IMPLANT
GAUZE SPONGE 4X4 12PLY STRL LF (GAUZE/BANDAGES/DRESSINGS) ×4 IMPLANT
GAUZE SPONGE 4X4 16PLY XRAY LF (GAUZE/BANDAGES/DRESSINGS) IMPLANT
GLOVE BIO SURGEON STRL SZ7.5 (GLOVE) ×2 IMPLANT
GLOVE BIOGEL PI IND STRL 7.5 (GLOVE) IMPLANT
GLOVE BIOGEL PI INDICATOR 7.5 (GLOVE) ×1
GOWN PREVENTION PLUS XLARGE (GOWN DISPOSABLE) ×3 IMPLANT
MARKER SKIN DUAL TIP RULER LAB (MISCELLANEOUS) IMPLANT
NEEDLE 27GAX1X1/2 (NEEDLE) ×2 IMPLANT
NS IRRIG 1000ML POUR BTL (IV SOLUTION) ×2 IMPLANT
PACK BASIN DAY SURGERY FS (CUSTOM PROCEDURE TRAY) ×3 IMPLANT
PENCIL BUTTON HOLSTER BLD 10FT (ELECTRODE) ×2 IMPLANT
SCISSORS WIRE ANG 4 3/4 DISP (INSTRUMENTS) IMPLANT
SHEET MEDIUM DRAPE 40X70 STRL (DRAPES) ×2 IMPLANT
SUT CHROMIC 3 0 PS 2 (SUTURE) IMPLANT
SUT CHROMIC 4 0 PS 2 18 (SUTURE) IMPLANT
SUT CHROMIC 4 0 RB 1X27 (SUTURE) IMPLANT
SYR CONTROL 10ML LL (SYRINGE) ×2 IMPLANT
TOWEL OR 17X24 6PK STRL BLUE (TOWEL DISPOSABLE) ×3 IMPLANT
TRAY DSU PREP LF (CUSTOM PROCEDURE TRAY) IMPLANT
TUBE CONNECTING 20X1/4 (TUBING) ×2 IMPLANT
WATER STERILE IRR 1000ML POUR (IV SOLUTION) ×1 IMPLANT
YANKAUER SUCT BULB TIP NO VENT (SUCTIONS) ×1 IMPLANT

## 2012-09-27 NOTE — Anesthesia Postprocedure Evaluation (Signed)
  Anesthesia Post-op Note  Patient: Arthur Beasley  Procedure(s) Performed: Procedure(s) (LRB) with comments: MANDIBULAR HARDWARE REMOVAL (N/A) - REMOVAL OF MMF HARDWARE  Patient Location: PACU  Anesthesia Type:MAC  Level of Consciousness: awake  Airway and Oxygen Therapy: Patient Spontanous Breathing  Post-op Pain: mild  Post-op Assessment: Post-op Vital signs reviewed  Post-op Vital Signs: Reviewed  Complications: No apparent anesthesia complications

## 2012-09-27 NOTE — Brief Op Note (Signed)
09/27/2012  10:10 AM  PATIENT:  Arthur Beasley  31 y.o. male  PRE-OPERATIVE DIAGNOSIS:  MANDIBULER FRACTURE  POST-OPERATIVE DIAGNOSIS:  MANDIBULER FRACTURE  PROCEDURE:  Procedure(s) (LRB) with comments: MANDIBULAR HARDWARE REMOVAL (N/A) - REMOVAL OF MMF HARDWARE  SURGEON:  Surgeon(s) and Role:    * Darletta Moll, MD - Primary  PHYSICIAN ASSISTANT:   ASSISTANTS: none   ANESTHESIA:   general  EBL:  Total I/O In: 100 [I.V.:100] Out: -   BLOOD ADMINISTERED:none  DRAINS: none   LOCAL MEDICATIONS USED:  LIDOCAINE   SPECIMEN:  No Specimen  DISPOSITION OF SPECIMEN:  N/A  COUNTS:  YES  TOURNIQUET:  * No tourniquets in log *  DICTATION: .Other Dictation: Dictation Number [id number]  PLAN OF CARE: Discharge to home after PACU  PATIENT DISPOSITION:  PACU - hemodynamically stable.   Delay start of Pharmacological VTE agent (>24hrs) due to surgical blood loss or risk of bleeding: not applicable
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
## 2012-09-27 NOTE — Transfer of Care (Signed)
Immediate Anesthesia Transfer of Care Note  Patient: Arthur Beasley  Procedure(s) Performed: Procedure(s) (LRB) with comments: MANDIBULAR HARDWARE REMOVAL (N/A) - REMOVAL OF MMF HARDWARE  Patient Location: PACU  Anesthesia Type:MAC  Level of Consciousness: awake, alert  and oriented  Airway & Oxygen Therapy: Patient Spontanous Breathing  Post-op Assessment: Report given to PACU RN and Post -op Vital signs reviewed and stable  Post vital signs: Reviewed and stable  Complications: No apparent anesthesia complications

## 2012-09-27 NOTE — Anesthesia Preprocedure Evaluation (Signed)
Anesthesia Evaluation  Patient identified by MRN, date of birth, ID band Patient awake    Reviewed: Allergy & Precautions, H&P , NPO status , Patient's Chart, lab work & pertinent test results  Airway Mallampati: II      Dental   Pulmonary neg pulmonary ROS,  breath sounds clear to auscultation        Cardiovascular negative cardio ROS  Rhythm:Regular Rate:Normal     Neuro/Psych    GI/Hepatic negative GI ROS, Neg liver ROS,   Endo/Other  negative endocrine ROS  Renal/GU negative Renal ROS     Musculoskeletal   Abdominal   Peds  Hematology negative hematology ROS (+)   Anesthesia Other Findings   Reproductive/Obstetrics                           Anesthesia Physical Anesthesia Plan  ASA: II  Anesthesia Plan: General   Post-op Pain Management:    Induction: Intravenous  Airway Management Planned: Nasal Cannula  Additional Equipment:   Intra-op Plan:   Post-operative Plan:   Informed Consent: I have reviewed the patients History and Physical, chart, labs and discussed the procedure including the risks, benefits and alternatives for the proposed anesthesia with the patient or authorized representative who has indicated his/her understanding and acceptance.   Dental advisory given  Plan Discussed with: CRNA, Anesthesiologist and Surgeon  Anesthesia Plan Comments:         Anesthesia Quick Evaluation

## 2012-09-28 ENCOUNTER — Encounter (HOSPITAL_BASED_OUTPATIENT_CLINIC_OR_DEPARTMENT_OTHER): Payer: Self-pay | Admitting: Otolaryngology
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
## 2013-06-21 ENCOUNTER — Encounter (HOSPITAL_COMMUNITY): Payer: Self-pay | Admitting: *Deleted

## 2013-06-21 ENCOUNTER — Emergency Department (HOSPITAL_COMMUNITY)
Admission: EM | Admit: 2013-06-21 | Discharge: 2013-06-21 | Disposition: A | Discharge status: Home / Self Care | Payer: Self-pay | Attending: Emergency Medicine | Admitting: Emergency Medicine

## 2013-06-21 ENCOUNTER — Emergency Department (HOSPITAL_COMMUNITY): Payer: Self-pay

## 2013-06-21 DIAGNOSIS — Z9889 Other specified postprocedural states: Secondary | ICD-10-CM | POA: Insufficient documentation

## 2013-06-21 DIAGNOSIS — B353 Tinea pedis: Secondary | ICD-10-CM | POA: Insufficient documentation

## 2013-06-21 DIAGNOSIS — Z7982 Long term (current) use of aspirin: Secondary | ICD-10-CM | POA: Insufficient documentation

## 2013-06-21 DIAGNOSIS — R6884 Jaw pain: Secondary | ICD-10-CM | POA: Insufficient documentation

## 2013-06-21 DIAGNOSIS — F17200 Nicotine dependence, unspecified, uncomplicated: Secondary | ICD-10-CM | POA: Insufficient documentation

## 2013-06-21 DIAGNOSIS — Z79899 Other long term (current) drug therapy: Secondary | ICD-10-CM | POA: Insufficient documentation

## 2013-06-21 DIAGNOSIS — Z880 Allergy status to penicillin: Secondary | ICD-10-CM | POA: Insufficient documentation

## 2013-06-21 DIAGNOSIS — F319 Bipolar disorder, unspecified: Secondary | ICD-10-CM | POA: Insufficient documentation

## 2013-06-21 MED ORDER — CLINDAMYCIN HCL 300 MG PO CAPS: 300.0000 mg | ORAL_CAPSULE | Freq: Three times a day (TID) | ORAL | Status: DC

## 2013-06-21 MED ORDER — CLINDAMYCIN HCL 300 MG PO CAPS
300.0000 mg | ORAL_CAPSULE | Freq: Once | ORAL | Status: AC
  Administered 2013-06-21: 300 mg via ORAL
  Filled 2013-06-21 (×2): qty 1

## 2013-06-21 MED ORDER — CLOTRIMAZOLE 1 % EX CREA: TOPICAL_CREAM | CUTANEOUS | Status: DC

## 2013-06-21 MED ORDER — HYDROCODONE-ACETAMINOPHEN 5-325 MG PO TABS: 1.0000 | ORAL_TABLET | Freq: Four times a day (QID) | ORAL | Status: DC | PRN

## 2013-06-21 MED ORDER — HYDROCODONE-ACETAMINOPHEN 5-325 MG PO TABS
2.0000 | ORAL_TABLET | Freq: Once | ORAL | Status: AC
  Administered 2013-06-21: 2 via ORAL
  Filled 2013-06-21: qty 2

## 2013-06-21 MED ORDER — CLOTRIMAZOLE 1 % EX CREA
TOPICAL_CREAM | Freq: Two times a day (BID) | CUTANEOUS | Status: DC
  Administered 2013-06-21: 23:00:00 via TOPICAL
  Filled 2013-06-21: qty 15
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
## 2013-06-21 NOTE — ED Notes (Signed)
Pt states the screw came out and he almost swallowed it, he saved it

## 2013-06-23 ENCOUNTER — Ambulatory Visit (INDEPENDENT_AMBULATORY_CARE_PROVIDER_SITE_OTHER): Payer: Self-pay | Admitting: Otolaryngology

## 2013-12-04 ENCOUNTER — Inpatient Hospital Stay (HOSPITAL_COMMUNITY)
Admission: EM | Admit: 2013-12-04 | Discharge: 2013-12-06 | DRG: 131 | Disposition: A | Discharge status: Home / Self Care | Payer: Medicaid Other | Attending: Internal Medicine | Admitting: Internal Medicine

## 2013-12-04 ENCOUNTER — Emergency Department (HOSPITAL_COMMUNITY): Payer: Medicaid Other

## 2013-12-04 ENCOUNTER — Encounter (HOSPITAL_COMMUNITY): Payer: Self-pay | Admitting: Emergency Medicine

## 2013-12-04 DIAGNOSIS — F17200 Nicotine dependence, unspecified, uncomplicated: Secondary | ICD-10-CM | POA: Diagnosis present

## 2013-12-04 DIAGNOSIS — T847XXA Infection and inflammatory reaction due to other internal orthopedic prosthetic devices, implants and grafts, initial encounter: Secondary | ICD-10-CM | POA: Diagnosis present

## 2013-12-04 DIAGNOSIS — M272 Inflammatory conditions of jaws: Principal | ICD-10-CM | POA: Diagnosis present

## 2013-12-04 DIAGNOSIS — Y831 Surgical operation with implant of artificial internal device as the cause of abnormal reaction of the patient, or of later complication, without mention of misadventure at the time of the procedure: Secondary | ICD-10-CM | POA: Diagnosis present

## 2013-12-04 DIAGNOSIS — R300 Dysuria: Secondary | ICD-10-CM

## 2013-12-04 DIAGNOSIS — F319 Bipolar disorder, unspecified: Secondary | ICD-10-CM | POA: Diagnosis present

## 2013-12-04 LAB — CBC WITH DIFFERENTIAL/PLATELET
BASOS PCT: 0 % (ref 0–1)
Basophils Absolute: 0 10*3/uL (ref 0.0–0.1)
EOS ABS: 0.1 10*3/uL (ref 0.0–0.7)
EOS PCT: 1 % (ref 0–5)
HCT: 39.5 % (ref 39.0–52.0)
HEMOGLOBIN: 13.5 g/dL (ref 13.0–17.0)
Lymphocytes Relative: 18 % (ref 12–46)
Lymphs Abs: 1.6 10*3/uL (ref 0.7–4.0)
MCH: 30.5 pg (ref 26.0–34.0)
MCHC: 34.2 g/dL (ref 30.0–36.0)
MCV: 89.2 fL (ref 78.0–100.0)
MONO ABS: 1 10*3/uL (ref 0.1–1.0)
MONOS PCT: 11 % (ref 3–12)
NEUTROS PCT: 70 % (ref 43–77)
Neutro Abs: 6.3 10*3/uL (ref 1.7–7.7)
Platelets: 238 10*3/uL (ref 150–400)
RBC: 4.43 MIL/uL (ref 4.22–5.81)
RDW: 14.1 % (ref 11.5–15.5)
WBC: 8.9 10*3/uL (ref 4.0–10.5)

## 2013-12-04 LAB — COMPREHENSIVE METABOLIC PANEL
ALBUMIN: 3.9 g/dL (ref 3.5–5.2)
ALT: 11 U/L (ref 0–53)
AST: 14 U/L (ref 0–37)
Alkaline Phosphatase: 84 U/L (ref 39–117)
BUN: 9 mg/dL (ref 6–23)
CALCIUM: 9.3 mg/dL (ref 8.4–10.5)
CO2: 27 mEq/L (ref 19–32)
CREATININE: 0.84 mg/dL (ref 0.50–1.35)
Chloride: 102 mEq/L (ref 96–112)
GFR calc non Af Amer: >90 mL/min (ref >90)
GLUCOSE: 90 mg/dL (ref 70–99)
POTASSIUM: 4.1 meq/L (ref 3.7–5.3)
Sodium: 141 mEq/L (ref 137–147)
TOTAL PROTEIN: 7.4 g/dL (ref 6.0–8.3)
Total Bilirubin: 0.3 mg/dL (ref 0.3–1.2)

## 2013-12-04 MED ORDER — OXYCODONE HCL 5 MG PO TABS
5.0000 mg | ORAL_TABLET | ORAL | Status: DC | PRN
  Administered 2013-12-05 – 2013-12-06 (×7): 5 mg via ORAL
  Filled 2013-12-04 (×7): qty 1

## 2013-12-04 MED ORDER — IOHEXOL 300 MG/ML  SOLN
100.0000 mL | Freq: Once | INTRAMUSCULAR | Status: AC | PRN
  Administered 2013-12-04: 100 mL via INTRAVENOUS

## 2013-12-04 MED ORDER — ONDANSETRON HCL 4 MG/2ML IJ SOLN
4.0000 mg | Freq: Three times a day (TID) | INTRAMUSCULAR | Status: DC | PRN
Start: 2013-12-04 — End: 2013-12-04

## 2013-12-04 MED ORDER — SODIUM CHLORIDE 0.9 % IV SOLN
INTRAVENOUS | Status: DC
  Administered 2013-12-04: via INTRAVENOUS

## 2013-12-04 MED ORDER — ONDANSETRON HCL 4 MG/2ML IJ SOLN
4.0000 mg | Freq: Once | INTRAMUSCULAR | Status: AC
  Administered 2013-12-04: 4 mg via INTRAVENOUS
  Filled 2013-12-04: qty 2

## 2013-12-04 MED ORDER — CLINDAMYCIN PHOSPHATE 600 MG/50ML IV SOLN
600.0000 mg | Freq: Three times a day (TID) | INTRAVENOUS | Status: DC
  Administered 2013-12-05 – 2013-12-06 (×4): 600 mg via INTRAVENOUS
  Filled 2013-12-04 (×6): qty 50

## 2013-12-04 MED ORDER — ACETAMINOPHEN 325 MG PO TABS: 650.0000 mg | ORAL_TABLET | Freq: Four times a day (QID) | ORAL | Status: DC | PRN

## 2013-12-04 MED ORDER — CLINDAMYCIN PHOSPHATE 600 MG/50ML IV SOLN
600.0000 mg | Freq: Once | INTRAVENOUS | Status: AC
Start: 2013-12-04 — End: 2013-12-04
  Administered 2013-12-04: 600 mg via INTRAVENOUS
  Filled 2013-12-04: qty 50

## 2013-12-04 MED ORDER — ACETAMINOPHEN 650 MG RE SUPP: 650.0000 mg | Freq: Four times a day (QID) | RECTAL | Status: DC | PRN

## 2013-12-04 MED ORDER — ACETAMINOPHEN 325 MG PO TABS
650.0000 mg | ORAL_TABLET | Freq: Four times a day (QID) | ORAL | Status: DC | PRN
  Administered 2013-12-04: 650 mg via ORAL
  Filled 2013-12-04: qty 2

## 2013-12-04 MED ORDER — ONDANSETRON HCL 4 MG/2ML IJ SOLN: 4.0000 mg | Freq: Four times a day (QID) | INTRAMUSCULAR | Status: DC | PRN

## 2013-12-04 MED ORDER — MORPHINE SULFATE 4 MG/ML IJ SOLN
4.0000 mg | Freq: Once | INTRAMUSCULAR | Status: AC
  Administered 2013-12-04: 4 mg via INTRAVENOUS
  Filled 2013-12-04: qty 1

## 2013-12-04 MED ORDER — HYDROMORPHONE HCL PF 1 MG/ML IJ SOLN: 1.0000 mg | INTRAMUSCULAR | Status: DC | PRN

## 2013-12-04 MED ORDER — MORPHINE SULFATE 2 MG/ML IJ SOLN
1.0000 mg | INTRAMUSCULAR | Status: DC | PRN
  Administered 2013-12-05 – 2013-12-06 (×2): 1 mg via INTRAVENOUS
  Filled 2013-12-04 (×2): qty 1

## 2013-12-04 MED ORDER — KCL IN DEXTROSE-NACL 20-5-0.45 MEQ/L-%-% IV SOLN
INTRAVENOUS | Status: DC
  Administered 2013-12-05 (×2): via INTRAVENOUS
  Filled 2013-12-04 (×4): qty 1000

## 2013-12-04 MED ORDER — ONDANSETRON HCL 4 MG PO TABS: 4.0000 mg | ORAL_TABLET | Freq: Four times a day (QID) | ORAL | Status: DC | PRN

## 2013-12-04 NOTE — H&P (Addendum)
[facility] History and Physical  Arthur Beasley [id number] DOB: 09/13/1981 DOA: 12/04/2013  Referring physician: Dr. Wyvonnia Dusky PCP: No PCP Per Patient  Specialist: ENT consulted by ED physician  Chief Complaint: Right jaw pain  HPI: Arthur Beasley is a 33 y.o. male  Presents with four days of right jaw pain and swelling. Pt has a history of jaw fracture with retained orthopedic hardware, and bipolar. Pt reports that it is difficult to chew and swallow due to the pain. Denies shortness of breath, sore throat, congestion, or trauma. Abscess noted to right lower jaw. Pt reports nothing making the pain better or has not taken any medication at home for the pain. Pt reports burning with urination and is concerned about having possible STD exposure.  ED consulted ENT for abscess drainage. Pt afebrile without leukocytosis. CT of maxillofacial shows focal 2.7 x 1.4 x 2.3 cm peripherally enhancing collection of fluid seen lateral to the right angle of the mandible, compatible with focal abscess. The abscess directly abuts a plate and screws along the right angle of the mandible. Overlying soft tissue swelling and no dental abnormalities.   Review of Systems:  Constitutional:  No weight loss, night sweats, Fevers, chills, fatigue.  HEENT: + pain and swelling to right jaw, + difficulty swallowing No headaches, Tooth/dental problems,Sore throat,  No sneezing, itching, ear ache, nasal congestion, post nasal drip,  Cardio-vascular:  No chest pain, Orthopnea, PND, swelling in lower extremities, anasarca, dizziness, palpitations  GI:  No heartburn, indigestion, abdominal pain, nausea, vomiting, diarrhea, change in bowel habits, loss of appetite  Resp:  No shortness of breath with exertion or at rest. No excess mucus, no productive cough, No non-productive cough, No coughing up of blood.No change in color of mucus.No wheezing.No chest wall deformity  Skin: + right jaw abscess no rash or  lesions.  GU: + dysuria,  No change in color of urine, no urgency or frequency. No flank pain.  Musculoskeletal:  No joint pain or swelling. No decreased range of motion. No back pain.  Psych:  No change in mood or affect. No depression or anxiety. No memory loss.   Past Medical History  Diagnosis Date  . Depression   . Bipolar disorder   . Auditory hallucinations   . Retained orthopedic hardware     mandible; unable to open mouth wide   Past Surgical History  Procedure Laterality Date  . Orif mandibular fracture  08/18/2012    Procedure: OPEN REDUCTION INTERNAL FIXATION (ORIF) MANDIBULAR FRACTURE;  Surgeon: Ascencion Dike, MD;  Location: [place];  Service: ENT;  Laterality: N/A;  . Mandibular hardware removal  09/27/2012    Procedure: MANDIBULAR HARDWARE REMOVAL;  Surgeon: Ascencion Dike, MD;  Location: [place];  Service: ENT;  Laterality: N/A;  REMOVAL OF MMF HARDWARE   Social History:  reports that he has been smoking Cigarettes.  He has been smoking about 1.00 pack per day. He has never used smokeless tobacco. He reports that he does not drink alcohol or use illicit drugs.  Allergies  Allergen Reactions  . Penicillins Itching    History reviewed. No pertinent family history.   Prior to Admission medications   Medication Sig Start Date End Date Taking? Authorizing Provider  aspirin-acetaminophen-caffeine (EXCEDRIN MIGRAINE) [phone] MG per tablet Take 1 tablet by mouth every 6 (six) hours as needed for headache.   Yes Historical Provider, MD   Physical Exam: Filed Vitals:   12/04/13 2257  BP: 163/96  Pulse: 71  Temp: 98.6 F (37 C)  Resp: 14    BP 163/96  Pulse 71  Temp(Src) 98.6 F (37 C) (Oral)  Resp 14  Ht 5\' 6"  (1.676 m)  Wt 54.432 kg (120 lb)  BMI 19.38 kg/m2  SpO2 98%  General:  Alert and oriented. Using suction as needed Eyes: PERRL, normal lids, irises & conjunctiva ENT: abscess noted to right jaw, 2.7 x 1.4x 2.3 cm. Swelling around  abscess and tender to palpation. grossly normal hearing. No dental infection noted. Hardware noted to lower right jaw. Neck: no LAD, masses or thyromegaly Cardiovascular: RRR, no m/r/g. No LE edema. Telemetry: SR, no arrhythmias  Respiratory: CTA bilaterally, no w/r/r. Normal respiratory effort. Abdomen: soft, ntnd Skin: no rash or induration seen on limited exam Musculoskeletal: grossly normal tone BUE/BLE Psychiatric: grossly normal mood and affect, speech fluent and appropriate Neurologic: grossly non-focal.          Labs on Admission:  Basic Metabolic Panel:  Recent Labs Lab 12/04/13 2050  NA 141  K 4.1  CL 102  CO2 27  GLUCOSE 90  BUN 9  CREATININE 0.84  CALCIUM 9.3   Liver Function Tests:  Recent Labs Lab 12/04/13 2050  AST 14  ALT 11  ALKPHOS 84  BILITOT 0.3  PROT 7.4  ALBUMIN 3.9   No results found for this basename: LIPASE, AMYLASE,  in the last 168 hours No results found for this basename: AMMONIA,  in the last 168 hours CBC:  Recent Labs Lab 12/04/13 2050  WBC 8.9  NEUTROABS 6.3  HGB 13.5  HCT 39.5  MCV 89.2  PLT 238   Cardiac Enzymes: No results found for this basename: CKTOTAL, CKMB, CKMBINDEX, TROPONINI,  in the last 168 hours  BNP (last 3 results) No results found for this basename: PROBNP,  in the last 8760 hours CBG: No results found for this basename: GLUCAP,  in the last 168 hours  Radiological Exams on Admission: Ct Maxillofacial W/cm  12/04/2013   CLINICAL DATA:  Right-sided facial pain.  EXAM: CT MAXILLOFACIAL WITH CONTRAST  TECHNIQUE: Multidetector CT imaging of the maxillofacial structures was performed with intravenous contrast. Multiplanar CT image reconstructions were also generated. A small metallic BB was placed on the right temple in order to reliably differentiate right from left.  CONTRAST:  149mL OMNIPAQUE IOHEXOL 300 MG/ML  SOLN  COMPARISON:  CT MAXILLOFACIAL W/O CM dated 06/21/2013  FINDINGS: There is a focal 2.7 x 1.4 x  2.3 cm peripherally enhancing collection of fluid seen lateral to the right angle of the mandible, compatible with a focal abscess. The abscess directly abuts a plate and screws along the right angle of the mandible. There is no definite evidence of osseous erosion.  Overlying soft tissue swelling is noted. No definite cervical lymphadenopathy is seen. The parotid and submandibular glands are relatively symmetric bilaterally.  There is no evidence of fracture or dislocation. The maxilla and mandible appear intact. The nasal bone is unremarkable in appearance. The visualized dentition demonstrates no acute abnormality.  The orbits are intact bilaterally. The visualized paranasal sinuses and mastoid air cells are well-aerated.  No significant soft tissue abnormalities are seen. The parapharyngeal fat planes are preserved. The nasopharynx, oropharynx and hypopharynx are unremarkable in appearance. The visualized portions of the valleculae and piriform sinuses are grossly unremarkable. The visualized portions of the brain are unremarkable in appearance.  IMPRESSION: 1. Focal 2.7 x 1.4 x 2.3 cm peripherally enhancing collection of fluid seen lateral  to the right angle of the mandible, compatible with a focal abscess. The abscess directly abuts a plate and screws along the right angle of the mandible. No definite evidence of osseous erosion. 2. Overlying soft tissue swelling noted; no additional acute dental abnormalities seen.   Electronically Signed   By: Garald Balding M.D.   On: 12/04/2013 22:35    Assessment/Plan Active Problems:    Abscess, jaw -NPO with MIVF -ENT on board and planning surgery next am. NPO and place on MIVF -CT maxillofacial shows abscess to right angle of mandible -pain medication PRN - Clindamycin IV  Dysuria - Urinalysis and check for urine GC Chlamydia  Bipolar d/o  - not on any home regimen. Stable currently   Code Status: Full code Family Communication: spoke with patient  regarding plan of care  Disposition Plan: admit, pending OR abscess drainage per ENT  Time spent: > 55 minutes  Velvet Bathe [facility] Pager [phone]

## 2013-12-04 NOTE — ED Notes (Signed)
Patient transported to CT 

## 2013-12-04 NOTE — ED Provider Notes (Signed)
CSN: [id number]     Arrival date & time 12/04/13  1619 History   First MD Initiated Contact with Patient 12/04/13 2019     Chief Complaint  Patient presents with  . Jaw Pain   (Consider location/radiation/quality/duration/timing/severity/associated sxs/prior Treatment) HPI Comments: Patient presents with right-sided jaw pain for the past 2 days. He had surgery for a jaw fracture in November 2013. He's had ongoing issues with pain since then but his pain is worsened over the past several days and he feels like his hardware is moving out. He's had fevers at home up to 101. He's had difficulty opening his mouth. He denies any vomiting, abdominal apin, chest pain.  The history is provided by the patient.    Past Medical History  Diagnosis Date  . Depression   . Bipolar disorder   . Auditory hallucinations   . Retained orthopedic hardware     mandible; unable to open mouth wide   Past Surgical History  Procedure Laterality Date  . Orif mandibular fracture  08/18/2012    Procedure: OPEN REDUCTION INTERNAL FIXATION (ORIF) MANDIBULAR FRACTURE;  Surgeon: Ascencion Dike, MD;  Location: [place];  Service: ENT;  Laterality: N/A;  . Mandibular hardware removal  09/27/2012    Procedure: MANDIBULAR HARDWARE REMOVAL;  Surgeon: Ascencion Dike, MD;  Location: [place];  Service: ENT;  Laterality: N/A;  REMOVAL OF MMF HARDWARE   History reviewed. No pertinent family history. History  Substance Use Topics  . Smoking status: Current Every Day Smoker -- 1.00 packs/day    Types: Cigarettes  . Smokeless tobacco: Never Used  . Alcohol Use: No    Review of Systems  Constitutional: Negative for fever, activity change and appetite change.  HENT: Positive for dental problem.   Respiratory: Negative for cough, chest tightness and shortness of breath.   Cardiovascular: Negative for chest pain.  Gastrointestinal: Negative for nausea, vomiting and abdominal pain.  Genitourinary: Negative for dysuria  and hematuria.  Musculoskeletal: Negative for back pain.  Skin: Negative for rash.  Neurological: Negative for dizziness and headaches.  A complete 10 system review of systems was obtained and all systems are negative except as noted in the HPI and PMH.    Allergies  Penicillins  Home Medications   No current outpatient prescriptions on file. BP 162/96  Pulse 71  Temp(Src) 98.2 F (36.8 C) (Oral)  Resp 18  Ht 5\' 6"  (1.676 m)  Wt 120 lb (54.432 kg)  BMI 19.38 kg/m2  SpO2 100% Physical Exam  Constitutional: He is oriented to person, place, and time. He appears well-developed. No distress.  HENT:  Head: Normocephalic and atraumatic.  Mouth/Throat: Oropharynx is clear and moist. No oropharyngeal exudate.  Visible hardware along the right lower jaw with surrounding pain and purulence. Floor of mouth is soft. Unable to open mouth completely. Positive trismus.  Eyes: Conjunctivae and EOM are normal. Pupils are equal, round, and reactive to light.  Neck: Normal range of motion. Neck supple.  Cardiovascular: Normal rate, regular rhythm and normal heart sounds.   Pulmonary/Chest: Effort normal and breath sounds normal. No respiratory distress.  Abdominal: Soft. There is no tenderness. There is no rebound and no guarding.  Musculoskeletal: Normal range of motion. He exhibits no edema and no tenderness.  Neurological: He is alert and oriented to person, place, and time. No cranial nerve deficit. He exhibits normal muscle tone. Coordination normal.  Skin: Skin is warm. No erythema.    ED Course  Procedures (  including critical care time) Labs Review Labs Reviewed  GC/CHLAMYDIA PROBE AMP  CBC WITH DIFFERENTIAL  COMPREHENSIVE METABOLIC PANEL  BASIC METABOLIC PANEL  CBC  URINALYSIS, ROUTINE W REFLEX MICROSCOPIC   Imaging Review Ct Maxillofacial W/cm  12/04/2013   CLINICAL DATA:  Right-sided facial pain.  EXAM: CT MAXILLOFACIAL WITH CONTRAST  TECHNIQUE: Multidetector CT imaging of  the maxillofacial structures was performed with intravenous contrast. Multiplanar CT image reconstructions were also generated. A small metallic BB was placed on the right temple in order to reliably differentiate right from left.  CONTRAST:  190mL OMNIPAQUE IOHEXOL 300 MG/ML  SOLN  COMPARISON:  CT MAXILLOFACIAL W/O CM dated 06/21/2013  FINDINGS: There is a focal 2.7 x 1.4 x 2.3 cm peripherally enhancing collection of fluid seen lateral to the right angle of the mandible, compatible with a focal abscess. The abscess directly abuts a plate and screws along the right angle of the mandible. There is no definite evidence of osseous erosion.  Overlying soft tissue swelling is noted. No definite cervical lymphadenopathy is seen. The parotid and submandibular glands are relatively symmetric bilaterally.  There is no evidence of fracture or dislocation. The maxilla and mandible appear intact. The nasal bone is unremarkable in appearance. The visualized dentition demonstrates no acute abnormality.  The orbits are intact bilaterally. The visualized paranasal sinuses and mastoid air cells are well-aerated.  No significant soft tissue abnormalities are seen. The parapharyngeal fat planes are preserved. The nasopharynx, oropharynx and hypopharynx are unremarkable in appearance. The visualized portions of the valleculae and piriform sinuses are grossly unremarkable. The visualized portions of the brain are unremarkable in appearance.  IMPRESSION: 1. Focal 2.7 x 1.4 x 2.3 cm peripherally enhancing collection of fluid seen lateral to the right angle of the mandible, compatible with a focal abscess. The abscess directly abuts a plate and screws along the right angle of the mandible. No definite evidence of osseous erosion. 2. Overlying soft tissue swelling noted; no additional acute dental abnormalities seen.   Electronically Signed   By: Garald Balding M.D.   On: 12/04/2013 22:35    EKG Interpretation    Date/Time:     Ventricular Rate:    PR Interval:    QRS Duration:   QT Interval:    QTC Calculation:   R Axis:     Text Interpretation:              MDM   1. Abscess of jaw   2. Abscess, jaw   3. Dysuria    Concern for infected hardware of the right mandible. Patient is controlling secretions in no distress. He does have trismus which is apparently old. Fever and leukocytosis.  Concern for infected hardware the right mandible. CT scan shows abscess abutting plates and screws. Patient started on IV antibiotics. Discussed with ENT Dr. Benjamine Mola who plans to take patient to the OR tomorrow. We'll admit to medicine.    BP 162/96  Pulse 71  Temp(Src) 98.2 F (36.8 C) (Oral)  Resp 18  Ht 5\' 6"  (1.676 m)  Wt 120 lb (54.432 kg)  BMI 19.38 kg/m2  SpO2 100%    Ezequiel Essex, MD 12/05/13 0023

## 2013-12-04 NOTE — ED Notes (Signed)
Pt reports that he broke his jaw back in 2013 and had surgery here. States that he thinks the pins have come out and the plate is moving. Swelling noted to the right side of his face.

## 2013-12-05 ENCOUNTER — Encounter (HOSPITAL_COMMUNITY): Payer: Self-pay | Admitting: Critical Care Medicine

## 2013-12-05 ENCOUNTER — Encounter (HOSPITAL_COMMUNITY)
Admission: EM | Disposition: A | Discharge status: Home / Self Care | Payer: Self-pay | Source: Home / Self Care | Attending: Internal Medicine

## 2013-12-05 ENCOUNTER — Encounter (HOSPITAL_COMMUNITY): Payer: Medicaid Other | Admitting: Critical Care Medicine

## 2013-12-05 ENCOUNTER — Inpatient Hospital Stay (HOSPITAL_COMMUNITY): Payer: Medicaid Other | Admitting: Critical Care Medicine

## 2013-12-05 DIAGNOSIS — R300 Dysuria: Secondary | ICD-10-CM | POA: Diagnosis present

## 2013-12-05 DIAGNOSIS — M272 Inflammatory conditions of jaws: Secondary | ICD-10-CM | POA: Diagnosis present

## 2013-12-05 DIAGNOSIS — F319 Bipolar disorder, unspecified: Secondary | ICD-10-CM

## 2013-12-05 HISTORY — PX: MANDIBULAR HARDWARE REMOVAL: SHX5205

## 2013-12-05 LAB — CBC
HEMATOCRIT: 40.1 % (ref 39.0–52.0)
Hemoglobin: 13.5 g/dL (ref 13.0–17.0)
MCH: 30.1 pg (ref 26.0–34.0)
MCHC: 33.7 g/dL (ref 30.0–36.0)
MCV: 89.3 fL (ref 78.0–100.0)
Platelets: 216 10*3/uL (ref 150–400)
RBC: 4.49 MIL/uL (ref 4.22–5.81)
RDW: 14 % (ref 11.5–15.5)
WBC: 6.2 10*3/uL (ref 4.0–10.5)

## 2013-12-05 LAB — SURGICAL PCR SCREEN
MRSA, PCR: NEGATIVE
Staphylococcus aureus: NEGATIVE

## 2013-12-05 LAB — URINALYSIS, ROUTINE W REFLEX MICROSCOPIC
BILIRUBIN URINE: NEGATIVE
Glucose, UA: NEGATIVE mg/dL
HGB URINE DIPSTICK: NEGATIVE
Ketones, ur: NEGATIVE mg/dL
Leukocytes, UA: NEGATIVE
Nitrite: NEGATIVE
PROTEIN: NEGATIVE mg/dL
Specific Gravity, Urine: 1.027 (ref 1.005–1.030)
UROBILINOGEN UA: 0.2 mg/dL (ref 0.0–1.0)
pH: 7 (ref 5.0–8.0)

## 2013-12-05 LAB — BASIC METABOLIC PANEL
BUN: 8 mg/dL (ref 6–23)
CALCIUM: 9 mg/dL (ref 8.4–10.5)
CO2: 24 meq/L (ref 19–32)
CREATININE: 0.92 mg/dL (ref 0.50–1.35)
Chloride: 102 mEq/L (ref 96–112)
GFR calc non Af Amer: >90 mL/min (ref >90)
Glucose, Bld: 90 mg/dL (ref 70–99)
Potassium: 4.6 mEq/L (ref 3.7–5.3)
Sodium: 140 mEq/L (ref 137–147)

## 2013-12-05 LAB — RPR: RPR: NONREACTIVE

## 2013-12-05 SURGERY — REMOVAL, HARDWARE, MANDIBLE
Anesthesia: General | Site: Mouth

## 2013-12-05 MED ORDER — HYDROMORPHONE HCL PF 1 MG/ML IJ SOLN: 0.2500 mg | INTRAMUSCULAR | Status: DC | PRN

## 2013-12-05 MED ORDER — OXYCODONE HCL 5 MG PO TABS: 5.0000 mg | ORAL_TABLET | Freq: Once | ORAL | Status: DC | PRN

## 2013-12-05 MED ORDER — SUCCINYLCHOLINE CHLORIDE 20 MG/ML IJ SOLN
INTRAMUSCULAR | Status: DC | PRN
  Administered 2013-12-05: 120 mg via INTRAVENOUS

## 2013-12-05 MED ORDER — OXYCODONE HCL 5 MG/5ML PO SOLN: 5.0000 mg | Freq: Once | ORAL | Status: DC | PRN

## 2013-12-05 MED ORDER — CHLORHEXIDINE GLUCONATE 0.12 % MT SOLN
15.0000 mL | Freq: Two times a day (BID) | OROMUCOSAL | Status: DC
  Administered 2013-12-05: 15 mL via OROMUCOSAL
  Filled 2013-12-05: qty 15

## 2013-12-05 MED ORDER — DEXAMETHASONE SODIUM PHOSPHATE 4 MG/ML IJ SOLN
INTRAMUSCULAR | Status: DC | PRN
  Administered 2013-12-05: 4 mg via INTRAVENOUS

## 2013-12-05 MED ORDER — LIDOCAINE-EPINEPHRINE 1 %-1:100000 IJ SOLN
INTRAMUSCULAR | Status: DC | PRN
  Administered 2013-12-05: 1 mL

## 2013-12-05 MED ORDER — ONDANSETRON HCL 4 MG/2ML IJ SOLN: 4.0000 mg | Freq: Four times a day (QID) | INTRAMUSCULAR | Status: DC | PRN

## 2013-12-05 MED ORDER — 0.9 % SODIUM CHLORIDE (POUR BTL) OPTIME
TOPICAL | Status: DC | PRN
  Administered 2013-12-05: 1000 mL

## 2013-12-05 MED ORDER — LIDOCAINE-EPINEPHRINE 1 %-1:100000 IJ SOLN
INTRAMUSCULAR | Status: AC
  Filled 2013-12-05: qty 1

## 2013-12-05 MED ORDER — LACTATED RINGERS IV SOLN
INTRAVENOUS | Status: DC | PRN
  Administered 2013-12-05: 11:00:00 via INTRAVENOUS

## 2013-12-05 MED ORDER — LIDOCAINE HCL (CARDIAC) 20 MG/ML IV SOLN
INTRAVENOUS | Status: DC | PRN
  Administered 2013-12-05: 40 mg via INTRAVENOUS

## 2013-12-05 MED ORDER — NICOTINE 21 MG/24HR TD PT24
21.0000 mg | MEDICATED_PATCH | TRANSDERMAL | Status: DC
  Administered 2013-12-05: 21 mg via TRANSDERMAL
  Filled 2013-12-05 (×2): qty 1

## 2013-12-05 MED ORDER — BIOTENE DRY MOUTH MT LIQD: 15.0000 mL | Freq: Two times a day (BID) | OROMUCOSAL | Status: DC

## 2013-12-05 MED ORDER — FENTANYL CITRATE 0.05 MG/ML IJ SOLN
INTRAMUSCULAR | Status: DC | PRN
  Administered 2013-12-05: 50 ug via INTRAVENOUS

## 2013-12-05 MED ORDER — PROPOFOL 10 MG/ML IV BOLUS
INTRAVENOUS | Status: DC | PRN
  Administered 2013-12-05: 180 mg via INTRAVENOUS

## 2013-12-05 MED ORDER — ONDANSETRON HCL 4 MG/2ML IJ SOLN
INTRAMUSCULAR | Status: DC | PRN
Start: 2013-12-05 — End: 2013-12-05
  Administered 2013-12-05: 4 mg via INTRAVENOUS

## 2013-12-05 MED ORDER — MIDAZOLAM HCL 5 MG/5ML IJ SOLN
INTRAMUSCULAR | Status: DC | PRN
  Administered 2013-12-05: 2 mg via INTRAVENOUS

## 2013-12-05 SURGICAL SUPPLY — 24 items
BLADE SURG 15 STRL LF DISP TIS (BLADE) ×1 IMPLANT
BLADE SURG 15 STRL SS (BLADE) ×3
CANISTER SUCTION 2500CC (MISCELLANEOUS) ×3 IMPLANT
CLOTH BEACON ORANGE TIMEOUT ST (SAFETY) ×3 IMPLANT
COVER SURGICAL LIGHT HANDLE (MISCELLANEOUS) ×3 IMPLANT
DECANTER SPIKE VIAL GLASS SM (MISCELLANEOUS) ×3 IMPLANT
DRAPE PROXIMA HALF (DRAPES) ×3 IMPLANT
GAUZE SPONGE 4X4 16PLY XRAY LF (GAUZE/BANDAGES/DRESSINGS) IMPLANT
GLOVE ECLIPSE 7.5 STRL STRAW (GLOVE) ×3 IMPLANT
GOWN STRL NON-REIN LRG LVL3 (GOWN DISPOSABLE) ×6 IMPLANT
KIT BASIN OR (CUSTOM PROCEDURE TRAY) ×3 IMPLANT
KIT ROOM TURNOVER OR (KITS) ×3 IMPLANT
NEEDLE 27GAX1X1/2 (NEEDLE) ×3 IMPLANT
NS IRRIG 1000ML POUR BTL (IV SOLUTION) ×3 IMPLANT
PAD ARMBOARD 7.5X6 YLW CONV (MISCELLANEOUS) ×6 IMPLANT
SUCTION FRAZIER TIP 10 FR DISP (SUCTIONS) IMPLANT
SUT CHROMIC 3 0 PS 2 (SUTURE) IMPLANT
SUT CHROMIC 4 0 PS 2 18 (SUTURE) IMPLANT
SYR CONTROL 10ML LL (SYRINGE) ×3 IMPLANT
TRAY ENT MC OR (CUSTOM PROCEDURE TRAY) ×3 IMPLANT
TUBE CONNECTING 12'X1/4 (SUCTIONS) ×1
TUBE CONNECTING 12X1/4 (SUCTIONS) ×2 IMPLANT
WATER STERILE IRR 1000ML POUR (IV SOLUTION) ×3 IMPLANT
YANKAUER SUCT BULB TIP NO VENT (SUCTIONS) ×3 IMPLANT

## 2013-12-05 NOTE — Progress Notes (Signed)
[facility] PROGRESS NOTE  Arthur Beasley SVX:[id number] DOB: [birth year] DOA: 12/04/2013 PCP: No PCP Per Patient  Assessment/Plan: #1 right mandibular abscess with exposed mandibular plate and screws Patient for IND of abscess and removal of infected hardware today per ENT. Continue empiric IV clindamycin and transitioned to oral clindamycin on discharge to complete a 10-14 day course of antibiotic therapy. ENT following and appreciate input and recommendations.  #2 dysuria Urine GC Chlamydia pending. Urinalysis is negative for UTI. Check RPR.  #3 bipolar disorder/depression Stable. Will need outpatient followup.  #4 prophylaxis SCDs for DVT prophylaxis.  Code Status: Full Family Communication: Updated patient and girlfriend at bedside. Disposition Plan: Home when medically stable   Consultants:  ENT: Dr. Benjamine Mola 12/05/2013  Procedures:  CT maxillofacial 12/04/2013  Antibiotics:  IV clindamycin 12/05/2013  HPI/Subjective: Patient states she's feeling better. Patient complaining of dysuria.  Objective: Filed Vitals:   12/05/13 1026  BP: 124/86  Pulse: 68  Temp: 98 F (36.7 C)  Resp: 16    Intake/Output Summary (Last 24 hours) at 12/05/13 1047 Last data filed at 12/05/13 1028  Gross per 24 hour  Intake    204 ml  Output    500 ml  Net   -296 ml   Filed Weights   12/04/13 1627  Weight: 54.432 kg (120 lb)    Exam:   General:  NAD.  ENT: Oropharynx clear, edema and purulence in RL mandible. Positive trismus.  Cardiovascular: RRR  Respiratory: ctab  Abdomen: soft/nt/nd/+bs  Musculoskeletal: No c/c/e  Data Reviewed: Basic Metabolic Panel:  Recent Labs Lab 12/04/13 2050 12/05/13 0600  NA 141 140  K 4.1 4.6  CL 102 102  CO2 27 24  GLUCOSE 90 90  BUN 9 8  CREATININE 0.84 0.92  CALCIUM 9.3 9.0   Liver Function Tests:  Recent Labs Lab 12/04/13 2050  AST 14  ALT 11  ALKPHOS 84  BILITOT 0.3  PROT 7.4  ALBUMIN 3.9   No  results found for this basename: LIPASE, AMYLASE,  in the last 168 hours No results found for this basename: AMMONIA,  in the last 168 hours CBC:  Recent Labs Lab 12/04/13 2050 12/05/13 0600  WBC 8.9 6.2  NEUTROABS 6.3  --   HGB 13.5 13.5  HCT 39.5 40.1  MCV 89.2 89.3  PLT 238 216   Cardiac Enzymes: No results found for this basename: CKTOTAL, CKMB, CKMBINDEX, TROPONINI,  in the last 168 hours BNP (last 3 results) No results found for this basename: PROBNP,  in the last 8760 hours CBG: No results found for this basename: GLUCAP,  in the last 168 hours  Recent Results (from the past 240 hour(s))  SURGICAL PCR SCREEN     Status: None   Collection Time    12/05/13 12:34 AM      Result Value Range Status   MRSA, PCR NEGATIVE  NEGATIVE Final   Staphylococcus aureus NEGATIVE  NEGATIVE Final   Comment:            The Xpert SA Assay (FDA     approved for NASAL specimens     in patients over 32 years of age),     is one component of     a comprehensive surveillance     program.  Test performance has     been validated by [employer] for patients greater     than or equal to 51 year old.     It  is not intended     to diagnose infection nor to     guide or monitor treatment.     Studies: Ct Maxillofacial W/cm  12/04/2013   CLINICAL DATA:  Right-sided facial pain.  EXAM: CT MAXILLOFACIAL WITH CONTRAST  TECHNIQUE: Multidetector CT imaging of the maxillofacial structures was performed with intravenous contrast. Multiplanar CT image reconstructions were also generated. A small metallic BB was placed on the right temple in order to reliably differentiate right from left.  CONTRAST:  148mL OMNIPAQUE IOHEXOL 300 MG/ML  SOLN  COMPARISON:  CT MAXILLOFACIAL W/O CM dated 06/21/2013  FINDINGS: There is a focal 2.7 x 1.4 x 2.3 cm peripherally enhancing collection of fluid seen lateral to the right angle of the mandible, compatible with a focal abscess. The abscess directly abuts a plate and  screws along the right angle of the mandible. There is no definite evidence of osseous erosion.  Overlying soft tissue swelling is noted. No definite cervical lymphadenopathy is seen. The parotid and submandibular glands are relatively symmetric bilaterally.  There is no evidence of fracture or dislocation. The maxilla and mandible appear intact. The nasal bone is unremarkable in appearance. The visualized dentition demonstrates no acute abnormality.  The orbits are intact bilaterally. The visualized paranasal sinuses and mastoid air cells are well-aerated.  No significant soft tissue abnormalities are seen. The parapharyngeal fat planes are preserved. The nasopharynx, oropharynx and hypopharynx are unremarkable in appearance. The visualized portions of the valleculae and piriform sinuses are grossly unremarkable. The visualized portions of the brain are unremarkable in appearance.  IMPRESSION: 1. Focal 2.7 x 1.4 x 2.3 cm peripherally enhancing collection of fluid seen lateral to the right angle of the mandible, compatible with a focal abscess. The abscess directly abuts a plate and screws along the right angle of the mandible. No definite evidence of osseous erosion. 2. Overlying soft tissue swelling noted; no additional acute dental abnormalities seen.   Electronically Signed   By: Garald Balding M.D.   On: 12/04/2013 22:35    Scheduled Meds: . [facility] HOLD] antiseptic oral rinse  15 mL Mouth Rinse q12n4p  . [facility] HOLD] chlorhexidine  15 mL Mouth Rinse BID  . [[facility] HOLD] clindamycin (CLEOCIN) IV  600 mg Intravenous Q8H   Continuous Infusions: . dextrose 5 % and 0.45 % NaCl with KCl 20 mEq/L 75 mL/hr at 12/05/13 0050    Principal Problem:   Mandibular abscess: Right with exposed mandibular plate and screws Active Problems:   Abscess, jaw   Bipolar disorder, unspecified   Dysuria    Time spent: 35 mins    [facility] MD [facility] Pager [phone]. If 7PM-7AM, please contact  night-coverage at [email or web address], password [facility] 12/05/2013, 10:47 AM  LOS: 1 day

## 2013-12-05 NOTE — Transfer of Care (Signed)
Immediate Anesthesia Transfer of Care Note  Patient: Arthur Beasley  Procedure(s) Performed: Procedure(s): MANDIBULAR HARDWARE REMOVAL Irrigation and  dedridement (N/A)  Patient Location: PACU  Anesthesia Type:General  Level of Consciousness: awake, alert  and oriented  Airway & Oxygen Therapy: Patient Spontanous Breathing and Patient connected to nasal cannula oxygen  Post-op Assessment: Report given to PACU RN, Post -op Vital signs reviewed and stable and Patient moving all extremities X 4  Post vital signs: Reviewed and stable  Complications: No apparent anesthesia complications

## 2013-12-05 NOTE — Preoperative (Signed)
Beta Blockers   Reason not to administer Beta Blockers:Not Applicable 

## 2013-12-05 NOTE — Brief Op Note (Signed)
12/04/2013 - 12/05/2013  11:23 AM  PATIENT:  Arthur Beasley  33 y.o. male  PRE-OPERATIVE DIAGNOSIS:  Peri-mandibular abscess  POST-OPERATIVE DIAGNOSIS:  Peri-mandibular abscess  PROCEDURE:  Procedure(s): 1) Incision and drainage of peri-mandibular abscess 2) Removal of implanted mandibular hardware   SURGEON:  Surgeon(s) and Role:    * Ascencion Dike, MD - Primary  PHYSICIAN ASSISTANT:   ASSISTANTS: none   ANESTHESIA:   general  EBL:  Total I/O In: 804 [I.V.:804] Out: 25 [Blood:25]  BLOOD ADMINISTERED:none  DRAINS: none   LOCAL MEDICATIONS USED:  LIDOCAINE   SPECIMEN:  No Specimen  DISPOSITION OF SPECIMEN:  N/A  COUNTS:  YES  TOURNIQUET:  * No tourniquets in log *  DICTATION: .Other Dictation: Dictation Number [id number]  PLAN OF CARE: Admit for overnight observation  PATIENT DISPOSITION:  PACU - hemodynamically stable. Pt may be discharged home on oral clindamycin.  Pt will f/u in my office in 2 weeks.   Delay start of Pharmacological VTE agent (>24hrs) due to surgical blood loss or risk of bleeding: not applicable
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
## 2013-12-05 NOTE — Consult Note (Signed)
Reason for Consult: Mandibular abscess Referring Physician: Ezequiel Essex, MD  HPI:  Arthur Beasley is an 33 y.o. male who presented to the ER last night c/o right facial swelling and pain.  He was noted to have right-sided jaw pain for the past 2 days. He had surgery for a jaw fracture in October of 2013.  He's had fevers at home up to 101. He's had difficulty opening his mouth. He denies any vomiting, abdominal apin, chest pain.  Facial CT showed an abscess over the right angle of the mandible.    Past Medical History  Diagnosis Date  . Depression   . Bipolar disorder   . Auditory hallucinations   . Retained orthopedic hardware     mandible; unable to open mouth wide    Past Surgical History  Procedure Laterality Date  . Orif mandibular fracture  08/18/2012    Procedure: OPEN REDUCTION INTERNAL FIXATION (ORIF) MANDIBULAR FRACTURE;  Surgeon: Ascencion Dike, MD;  Location: [facility];  Service: ENT;  Laterality: N/A;  . Mandibular hardware removal  09/27/2012    Procedure: MANDIBULAR HARDWARE REMOVAL;  Surgeon: Ascencion Dike, MD;  Location: [place];  Service: ENT;  Laterality: N/A;  REMOVAL OF MMF HARDWARE    History reviewed. No pertinent family history.  Social History:  reports that he has been smoking Cigarettes.  He has been smoking about 1.00 pack per day. He has never used smokeless tobacco. He reports that he does not drink alcohol or use illicit drugs.  Allergies:  Allergies  Allergen Reactions  . Penicillins Itching    Medications:  I have reviewed the patient's current medications. Scheduled: . antiseptic oral rinse  15 mL Mouth Rinse q12n4p  . chlorhexidine  15 mL Mouth Rinse BID  . clindamycin (CLEOCIN) IV  600 mg Intravenous Q8H   LHT:[id number], acetaminophen, morphine injection, ondansetron (ZOFRAN) IV, ondansetron, oxyCODONE  Results for orders placed during the hospital encounter of 12/04/13 (from the past 48 hour(s))  CBC WITH DIFFERENTIAL      Status: None   Collection Time    12/04/13  8:50 PM      Result Value Range   WBC 8.9  4.0 - 10.5 K/uL   RBC 4.43  4.22 - 5.81 MIL/uL   Hemoglobin 13.5  13.0 - 17.0 g/dL   HCT 39.5  39.0 - 52.0 %   MCV 89.2  78.0 - 100.0 fL   MCH 30.5  26.0 - 34.0 pg   MCHC 34.2  30.0 - 36.0 g/dL   RDW 14.1  11.5 - 15.5 %   Platelets 238  150 - 400 K/uL   Neutrophils Relative % 70  43 - 77 %   Neutro Abs 6.3  1.7 - 7.7 K/uL   Lymphocytes Relative 18  12 - 46 %   Lymphs Abs 1.6  0.7 - 4.0 K/uL   Monocytes Relative 11  3 - 12 %   Monocytes Absolute 1.0  0.1 - 1.0 K/uL   Eosinophils Relative 1  0 - 5 %   Eosinophils Absolute 0.1  0.0 - 0.7 K/uL   Basophils Relative 0  0 - 1 %   Basophils Absolute 0.0  0.0 - 0.1 K/uL  COMPREHENSIVE METABOLIC PANEL     Status: None   Collection Time    12/04/13  8:50 PM      Result Value Range   Sodium 141  137 - 147 mEq/L   Potassium 4.1  3.7 - 5.3  mEq/L   Chloride 102  96 - 112 mEq/L   CO2 27  19 - 32 mEq/L   Glucose, Bld 90  70 - 99 mg/dL   BUN 9  6 - 23 mg/dL   Creatinine, Ser 0.84  0.50 - 1.35 mg/dL   Calcium 9.3  8.4 - 10.5 mg/dL   Total Protein 7.4  6.0 - 8.3 g/dL   Albumin 3.9  3.5 - 5.2 g/dL   AST 14  0 - 37 U/L   ALT 11  0 - 53 U/L   Alkaline Phosphatase 84  39 - 117 U/L   Total Bilirubin 0.3  0.3 - 1.2 mg/dL   GFR calc non Af Amer >90  >90 mL/min   GFR calc Af Amer >90  >90 mL/min   Comment: (NOTE)     The eGFR has been calculated using the CKD EPI equation.     This calculation has not been validated in all clinical situations.     eGFR's persistently <90 mL/min signify possible Chronic Kidney     Disease.  SURGICAL PCR SCREEN     Status: None   Collection Time    12/05/13 12:34 AM      Result Value Range   MRSA, PCR NEGATIVE  NEGATIVE   Staphylococcus aureus NEGATIVE  NEGATIVE   Comment:            The Xpert SA Assay (FDA     approved for NASAL specimens     in patients over 84 years of age),     is one component of     a  comprehensive surveillance     program.  Test performance has     been validated by [employer] for patients greater     than or equal to 10 year old.     It is not intended     to diagnose infection nor to     guide or monitor treatment.  URINALYSIS, ROUTINE W REFLEX MICROSCOPIC     Status: None   Collection Time    12/05/13  1:34 AM      Result Value Range   Color, Urine YELLOW  YELLOW   APPearance CLEAR  CLEAR   Specific Gravity, Urine 1.027  1.005 - 1.030   pH 7.0  5.0 - 8.0   Glucose, UA NEGATIVE  NEGATIVE mg/dL   Hgb urine dipstick NEGATIVE  NEGATIVE   Bilirubin Urine NEGATIVE  NEGATIVE   Ketones, ur NEGATIVE  NEGATIVE mg/dL   Protein, ur NEGATIVE  NEGATIVE mg/dL   Urobilinogen, UA 0.2  0.0 - 1.0 mg/dL   Nitrite NEGATIVE  NEGATIVE   Leukocytes, UA NEGATIVE  NEGATIVE   Comment: MICROSCOPIC NOT DONE ON URINES WITH NEGATIVE PROTEIN, BLOOD, LEUKOCYTES, NITRITE, OR GLUCOSE <1000 mg/dL.  BASIC METABOLIC PANEL     Status: None   Collection Time    12/05/13  6:00 AM      Result Value Range   Sodium 140  137 - 147 mEq/L   Potassium 4.6  3.7 - 5.3 mEq/L   Chloride 102  96 - 112 mEq/L   CO2 24  19 - 32 mEq/L   Glucose, Bld 90  70 - 99 mg/dL   BUN 8  6 - 23 mg/dL   Creatinine, Ser 0.92  0.50 - 1.35 mg/dL   Calcium 9.0  8.4 - 10.5 mg/dL   GFR calc non Af Amer >90  >90 mL/min   GFR  calc Af Amer >90  >90 mL/min   Comment: (NOTE)     The eGFR has been calculated using the CKD EPI equation.     This calculation has not been validated in all clinical situations.     eGFR's persistently <90 mL/min signify possible Chronic Kidney     Disease.  CBC     Status: None   Collection Time    12/05/13  6:00 AM      Result Value Range   WBC 6.2  4.0 - 10.5 K/uL   RBC 4.49  4.22 - 5.81 MIL/uL   Hemoglobin 13.5  13.0 - 17.0 g/dL   HCT 40.1  39.0 - 52.0 %   MCV 89.3  78.0 - 100.0 fL   MCH 30.1  26.0 - 34.0 pg   MCHC 33.7  30.0 - 36.0 g/dL   RDW 14.0  11.5 - 15.5 %   Platelets 216   150 - 400 K/uL    Ct Maxillofacial W/cm  12/04/2013   CLINICAL DATA:  Right-sided facial pain.  EXAM: CT MAXILLOFACIAL WITH CONTRAST  TECHNIQUE: Multidetector CT imaging of the maxillofacial structures was performed with intravenous contrast. Multiplanar CT image reconstructions were also generated. A small metallic BB was placed on the right temple in order to reliably differentiate right from left.  CONTRAST:  1100mL OMNIPAQUE IOHEXOL 300 MG/ML  SOLN  COMPARISON:  CT MAXILLOFACIAL W/O CM dated 06/21/2013  FINDINGS: There is a focal 2.7 x 1.4 x 2.3 cm peripherally enhancing collection of fluid seen lateral to the right angle of the mandible, compatible with a focal abscess. The abscess directly abuts a plate and screws along the right angle of the mandible. There is no definite evidence of osseous erosion.  Overlying soft tissue swelling is noted. No definite cervical lymphadenopathy is seen. The parotid and submandibular glands are relatively symmetric bilaterally.  There is no evidence of fracture or dislocation. The maxilla and mandible appear intact. The nasal bone is unremarkable in appearance. The visualized dentition demonstrates no acute abnormality.  The orbits are intact bilaterally. The visualized paranasal sinuses and mastoid air cells are well-aerated.  No significant soft tissue abnormalities are seen. The parapharyngeal fat planes are preserved. The nasopharynx, oropharynx and hypopharynx are unremarkable in appearance. The visualized portions of the valleculae and piriform sinuses are grossly unremarkable. The visualized portions of the brain are unremarkable in appearance.  IMPRESSION: 1. Focal 2.7 x 1.4 x 2.3 cm peripherally enhancing collection of fluid seen lateral to the right angle of the mandible, compatible with a focal abscess. The abscess directly abuts a plate and screws along the right angle of the mandible. No definite evidence of osseous erosion. 2. Overlying soft tissue swelling  noted; no additional acute dental abnormalities seen.   Electronically Signed   By: Garald Balding M.D.   On: 12/04/2013 22:35   Review of Systems  Constitutional: Negative for fever, activity change and appetite change.  HENT: As above Respiratory: Negative for cough, chest tightness and shortness of breath.  Cardiovascular: Negative for chest pain.  Gastrointestinal: Negative for nausea, vomiting and abdominal pain.  Genitourinary: Negative for dysuria and hematuria.  Musculoskeletal: Negative for back pain.  Skin: Negative for rash.  Neurological: Negative for dizziness and headaches.   Blood pressure 125/82, pulse 65, temperature 98 F (36.7 C), resp. rate 16, height $RemoveBe'5\' 6"'HPSXEaHmZ$  (1.676 m), weight 120 lb (54.432 kg), SpO2 95.00%.  Physical Exam  Constitutional: He is oriented to person, place, and time. He  appears well-developed. No distress.  HENT:  Head: Normocephalic and atraumatic.  Mouth/Throat: Oropharynx is clear and moist. No oropharyngeal exudate.  Visible hardware along the right lower mandible with surrounding edema and purulence. Floor of mouth is soft. Unable to open mouth completely. Positive trismus.  Eyes: Conjunctivae and EOM are normal. Pupils are equal, round, and reactive to light.  Neck: Normal range of motion. Neck supple.  Cardiovascular: Normal rate, regular rhythm and normal heart sounds.  Neurological: He is alert and oriented to person, place, and time. No cranial nerve deficit. He exhibits normal muscle tone. Coordination normal.  Skin: Skin is warm. No erythema.   Assessment/Plan: Right mandibular abscess with exposed mandibular plate and screws.  Plan I&D of abscess and removal of the infected hardware in OR today.  R/B/A/D of the procedures reviewed with the pt.  Informed consent obtained.  Kallon Caylor,SUI W 12/05/2013, 10:09 AM

## 2013-12-05 NOTE — Anesthesia Procedure Notes (Signed)
Procedure Name: Intubation Date/Time: 12/05/2013 11:03 AM Performed by: Carola Frost Pre-anesthesia Checklist: Patient identified, Timeout performed, Emergency Drugs available, Suction available and Patient being monitored Patient Re-evaluated:Patient Re-evaluated prior to inductionOxygen Delivery Method: Circle system utilized Preoxygenation: Pre-oxygenation with 100% oxygen Intubation Type: IV induction and Cricoid Pressure applied Ventilation: Mask ventilation without difficulty Laryngoscope Size: 4 and Mac Grade View: Grade III Tube size: 7.5 mm Number of attempts: 1 Airway Equipment and Method: Stylet Placement Confirmation: positive ETCO2,  ETT inserted through vocal cords under direct vision and breath sounds checked- equal and bilateral Secured at: 23 cm Tube secured with: Tape Dental Injury: Teeth and Oropharynx as per pre-operative assessment

## 2013-12-05 NOTE — Anesthesia Preprocedure Evaluation (Addendum)
Anesthesia Evaluation  Patient identified by MRN, date of birth, ID band Patient awake    Reviewed: Allergy & Precautions, H&P , NPO status , Patient's Chart, lab work & pertinent test results  Airway Mallampati: III  Neck ROM: full    Dental  (+) Dental Advisory Given and Teeth Intact   Pulmonary Current Smoker,          Cardiovascular negative cardio ROS      Neuro/Psych PSYCHIATRIC DISORDERS Depression Bipolar Disorder    GI/Hepatic   Endo/Other    Renal/GU      Musculoskeletal   Abdominal   Peds  Hematology   Anesthesia Other Findings   Reproductive/Obstetrics                          Anesthesia Physical Anesthesia Plan  ASA: II  Anesthesia Plan: General   Post-op Pain Management:    Induction: Intravenous  Airway Management Planned: Oral ETT  Additional Equipment:   Intra-op Plan:   Post-operative Plan: Extubation in OR  Informed Consent: I have reviewed the patients History and Physical, chart, labs and discussed the procedure including the risks, benefits and alternatives for the proposed anesthesia with the patient or authorized representative who has indicated his/her understanding and acceptance.   Dental advisory given  Plan Discussed with: Anesthesiologist and Surgeon  Anesthesia Plan Comments:         Anesthesia Quick Evaluation

## 2013-12-05 NOTE — Anesthesia Postprocedure Evaluation (Signed)
Anesthesia Post Note  Patient: Arthur Beasley  Procedure(s) Performed: Procedure(s) (LRB): MANDIBULAR HARDWARE REMOVAL Irrigation and  dedridement (N/A)  Anesthesia type: General  Patient location: PACU  Post pain: Pain level controlled and Adequate analgesia  Post assessment: Post-op Vital signs reviewed, Patient's Cardiovascular Status Stable, Respiratory Function Stable, Patent Airway and Pain level controlled  Last Vitals:  Filed Vitals:   12/05/13 1215  BP: 160/100  Pulse: 70  Temp: 36.4 C  Resp: 14    Post vital signs: Reviewed and stable  Level of consciousness: awake, alert  and oriented  Complications: No apparent anesthesia complications

## 2013-12-05 NOTE — Progress Notes (Signed)
OR here to pick up pt. Consents signed.

## 2013-12-06 LAB — CBC
HCT: 37.9 % — ABNORMAL LOW (ref 39.0–52.0)
HEMOGLOBIN: 13 g/dL (ref 13.0–17.0)
MCH: 30.2 pg (ref 26.0–34.0)
MCHC: 34.3 g/dL (ref 30.0–36.0)
MCV: 88.1 fL (ref 78.0–100.0)
Platelets: 251 10*3/uL (ref 150–400)
RBC: 4.3 MIL/uL (ref 4.22–5.81)
RDW: 13.6 % (ref 11.5–15.5)
WBC: 9.4 10*3/uL (ref 4.0–10.5)

## 2013-12-06 LAB — BASIC METABOLIC PANEL
BUN: 13 mg/dL (ref 6–23)
CALCIUM: 8.9 mg/dL (ref 8.4–10.5)
CO2: 26 mEq/L (ref 19–32)
CREATININE: 0.88 mg/dL (ref 0.50–1.35)
Chloride: 101 mEq/L (ref 96–112)
GFR calc Af Amer: >90 mL/min (ref >90)
GFR calc non Af Amer: >90 mL/min (ref >90)
GLUCOSE: 145 mg/dL — AB (ref 70–99)
Potassium: 4.4 mEq/L (ref 3.7–5.3)
SODIUM: 138 meq/L (ref 137–147)

## 2013-12-06 LAB — GC/CHLAMYDIA PROBE AMP
CT Probe RNA: NEGATIVE
GC Probe RNA: NEGATIVE

## 2013-12-06 MED ORDER — DIPHENHYDRAMINE HCL 25 MG PO CAPS
25.0000 mg | ORAL_CAPSULE | Freq: Four times a day (QID) | ORAL | Status: DC | PRN
  Administered 2013-12-06: 25 mg via ORAL
  Filled 2013-12-06: qty 1

## 2013-12-06 MED ORDER — NICOTINE 21 MG/24HR TD PT24: 21.0000 mg | MEDICATED_PATCH | TRANSDERMAL | Status: DC

## 2013-12-06 MED ORDER — DOCUSATE SODIUM 100 MG PO CAPS
200.0000 mg | ORAL_CAPSULE | Freq: Every day | ORAL | Status: DC
  Administered 2013-12-06: 200 mg via ORAL
  Filled 2013-12-06: qty 2

## 2013-12-06 MED ORDER — CLINDAMYCIN HCL 150 MG PO CAPS: 300.0000 mg | ORAL_CAPSULE | Freq: Four times a day (QID) | ORAL | Status: AC

## 2013-12-06 MED ORDER — OXYCODONE HCL 5 MG PO TABS: 5.0000 mg | ORAL_TABLET | ORAL | Status: DC | PRN

## 2013-12-06 NOTE — Discharge Summary (Signed)
Physician Discharge Summary  Arthur Beasley XLK:[id number] DOB: [birth year] DOA: 12/04/2013  PCP: No PCP Per Patient  Admit date: 12/04/2013 Discharge date: 12/06/2013  Time spent: 65 minutes  Recommendations for Outpatient Follow-up:  1. Followup with Dr. Benjamine Mola in 2 weeks.  Discharge Diagnoses:  Principal Problem:   Mandibular abscess: Right with exposed mandibular plate and screws Active Problems:   Abscess, jaw   Bipolar disorder, unspecified   Dysuria   Discharge Condition: Stable and improved  Diet recommendation: Regular  Filed Weights   12/04/13 1627  Weight: 54.432 kg (120 lb)    History of present illness:  Arthur Beasley is a 33 y.o. male  Presents with four days of right jaw pain and swelling. Pt has a history of jaw fracture with retained orthopedic hardware, and bipolar. Pt reports that it is difficult to chew and swallow due to the pain. Denies shortness of breath, sore throat, congestion, or trauma. Abscess noted to right lower jaw. Pt reports nothing making the pain better or has not taken any medication at home for the pain. Pt reports burning with urination and is concerned about having possible STD exposure.  ED consulted ENT for abscess drainage. Pt afebrile without leukocytosis. CT of maxillofacial shows focal 2.7 x 1.4 x 2.3 cm peripherally enhancing collection of fluid seen lateral to the right angle of the mandible, compatible with focal abscess. The abscess directly abuts a plate and screws along the right angle of the mandible. Overlying soft tissue swelling and no dental abnormalities.      Hospital Course:  #1 right perimandibular abscess Patient had presented to the ED with complaints of right facial swelling and pain. Patient was noted to have right-sided jaw pain 2 days prior to admission. Patient has had surgery for jaw fracture in October of 2013. Patient was noted to have fevers up to 101 difficulty opening his mouth. CT maxillofacial showed an  abscess of 2.7 x 1.4 x 2.3 cm over the right angle of the mandible. Patient was admitted and placed empirically on IV clindamycin an ENT consultation was obtained. Patient was seen in consultation by Dr. Benjamine Mola 12/05/2013. Patient was subsequently taken to the operating room where patient underwent an incision and drainage of the right perimandibular abscess and removal of implanted mandibular hardware. Patient tolerated procedure well and improved clinically. Patient was initially started on clear liquids and diet advanced to a regular diet which she tolerated. Patient will be discharged home on oral clindamycin 300 mg 4 times daily for 13 more days to complete a two-week course of antibiotic therapy. Patient will followup with Dr. Benjamine Mola 2 weeks post discharge. Patient will be discharged in stable and improved condition.  #2 dysuria On admission patient had some complaints of dysuria. Urinalysis which was done was negative for UTI. Urine GC and Chlamydia which was done was negative. RPR which was done was negative. Patient's dysuria had resolved by day of discharge.  #3 bipolar disorder/depression Remained stable. Patient will followup with Dr. Kenton Kingfisher of psychiatry as outpatient.  Procedures: CT maxillofacial 12/04/2013 Incision and drainage of right perimandibular abscess and removal of implanted mandibular hardware per Dr. Benjamine Mola 12/05/2013   Consultations: ENT: Dr. Benjamine Mola 12/05/2013     Discharge Exam: Filed Vitals:   12/06/13 0531  BP: 135/94  Pulse: 85  Temp: 98.6 F (37 C)  Resp: 17    General: NAD Cardiovascular: RRR Respiratory: CTAB  Discharge Instructions      Discharge Orders   Future Orders Complete  By Expires   Diet general  As directed    Discharge instructions  As directed    Comments:     Follow up with Dr Benjamine Mola in 2 weeks. Take all antibiotics.   Increase activity slowly  As directed        Medication List         aspirin-acetaminophen-caffeine [id number]  MG per tablet  Commonly known as:  EXCEDRIN MIGRAINE  Take 1 tablet by mouth every 6 (six) hours as needed for headache.     clindamycin 150 MG capsule  Commonly known as:  CLEOCIN  Take 2 capsules (300 mg total) by mouth 4 (four) times daily. Take for 13 days.     nicotine 21 mg/24hr patch  Commonly known as:  NICODERM CQ - dosed in mg/24 hours  Place 1 patch (21 mg total) onto the skin daily.     oxyCODONE 5 MG immediate release tablet  Commonly known as:  Oxy IR/ROXICODONE  Take 1 tablet (5 mg total) by mouth every 4 (four) hours as needed for moderate pain.       Allergies  Allergen Reactions  . Penicillins Itching   Follow-up Information   Follow up with Ascencion Dike, MD. Schedule an appointment as soon as possible for a visit in 2 weeks.   Specialty:  Otolaryngology   Contact information:   [place] [address] [phone]        The results of significant diagnostics from this hospitalization (including imaging, microbiology, ancillary and laboratory) are listed below for reference.    Significant Diagnostic Studies: Ct Maxillofacial W/cm  12/04/2013   CLINICAL DATA:  Right-sided facial pain.  EXAM: CT MAXILLOFACIAL WITH CONTRAST  TECHNIQUE: Multidetector CT imaging of the maxillofacial structures was performed with intravenous contrast. Multiplanar CT image reconstructions were also generated. A small metallic BB was placed on the right temple in order to reliably differentiate right from left.  CONTRAST:  133mL OMNIPAQUE IOHEXOL 300 MG/ML  SOLN  COMPARISON:  CT MAXILLOFACIAL W/O CM dated 06/21/2013  FINDINGS: There is a focal 2.7 x 1.4 x 2.3 cm peripherally enhancing collection of fluid seen lateral to the right angle of the mandible, compatible with a focal abscess. The abscess directly abuts a plate and screws along the right angle of the mandible. There is no definite evidence of osseous erosion.  Overlying soft tissue swelling is noted. No  definite cervical lymphadenopathy is seen. The parotid and submandibular glands are relatively symmetric bilaterally.  There is no evidence of fracture or dislocation. The maxilla and mandible appear intact. The nasal bone is unremarkable in appearance. The visualized dentition demonstrates no acute abnormality.  The orbits are intact bilaterally. The visualized paranasal sinuses and mastoid air cells are well-aerated.  No significant soft tissue abnormalities are seen. The parapharyngeal fat planes are preserved. The nasopharynx, oropharynx and hypopharynx are unremarkable in appearance. The visualized portions of the valleculae and piriform sinuses are grossly unremarkable. The visualized portions of the brain are unremarkable in appearance.  IMPRESSION: 1. Focal 2.7 x 1.4 x 2.3 cm peripherally enhancing collection of fluid seen lateral to the right angle of the mandible, compatible with a focal abscess. The abscess directly abuts a plate and screws along the right angle of the mandible. No definite evidence of osseous erosion. 2. Overlying soft tissue swelling noted; no additional acute dental abnormalities seen.   Electronically Signed   By: Arthur Beasley M.D.   On: 12/04/2013 22:35  Microbiology: Recent Results (from the past 240 hour(s))  SURGICAL PCR SCREEN     Status: None   Collection Time    12/05/13 12:34 AM      Result Value Range Status   MRSA, PCR NEGATIVE  NEGATIVE Final   Staphylococcus aureus NEGATIVE  NEGATIVE Final   Comment:            The Xpert SA Assay (FDA     approved for NASAL specimens     in patients over 7 years of age),     is one component of     a comprehensive surveillance     program.  Test performance has     been validated by [employer] for patients greater     than or equal to 61 year old.     It is not intended     to diagnose infection nor to     guide or monitor treatment.  GC/CHLAMYDIA PROBE AMP     Status: None   Collection Time    12/05/13   1:34 AM      Result Value Range Status   CT Probe RNA NEGATIVE  NEGATIVE Final   GC Probe RNA NEGATIVE  NEGATIVE Final   Comment: (NOTE)                                                                                               **Normal Reference Range: Negative**          Assay performed using the Gen-Probe APTIMA COMBO2 (R) Assay.     Acceptable specimen types for this assay include APTIMA Swabs (Unisex,     endocervical, urethral, or vaginal), first void urine, and ThinPrep     liquid based cytology samples.     Performed at [facility], ROUTINE-ABSCESS     Status: None   Collection Time    12/05/13 11:10 AM      Result Value Range Status   Specimen Description ABSCESS RIGHT MOUTH   Final   Special Requests RIGHT MANDIBULAR ABCESS   Final   Gram Stain     Final   Value: NO WBC SEEN     RARE SQUAMOUS EPITHELIAL CELLS PRESENT     NO ORGANISMS SEEN     Performed at [facility]   Culture     Final   Value: Culture reincubated for better growth     Performed at [facility]   Report Status PENDING   Incomplete  ANAEROBIC CULTURE     Status: None   Collection Time    12/05/13 11:10 AM      Result Value Range Status   Specimen Description ABSCESS RIGHT MOUTH   Final   Special Requests RIGHT MANDIBULAR ABCESS   Final   Gram Stain     Final   Value: NO WBC SEEN     NO SQUAMOUS EPITHELIAL CELLS SEEN     NO ORGANISMS SEEN     Performed at [facility]   Culture PENDING   Incomplete   Report Status  PENDING   Incomplete     Labs: Basic Metabolic Panel:  Recent Labs Lab 12/04/13 2050 12/05/13 0600 12/06/13 0355  NA 141 140 138  K 4.1 4.6 4.4  CL 102 102 101  CO2 27 24 26   GLUCOSE 90 90 145*  BUN 9 8 13   CREATININE 0.84 0.92 0.88  CALCIUM 9.3 9.0 8.9   Liver Function Tests:  Recent Labs Lab 12/04/13 2050  AST 14  ALT 11  ALKPHOS 84  BILITOT 0.3  PROT 7.4  ALBUMIN 3.9   No results found for this basename: LIPASE,  AMYLASE,  in the last 168 hours No results found for this basename: AMMONIA,  in the last 168 hours CBC:  Recent Labs Lab 12/04/13 2050 12/05/13 0600 12/06/13 0355  WBC 8.9 6.2 9.4  NEUTROABS 6.3  --   --   HGB 13.5 13.5 13.0  HCT 39.5 40.1 37.9*  MCV 89.2 89.3 88.1  PLT 238 216 251   Cardiac Enzymes: No results found for this basename: CKTOTAL, CKMB, CKMBINDEX, TROPONINI,  in the last 168 hours BNP: BNP (last 3 results) No results found for this basename: PROBNP,  in the last 8760 hours CBG: No results found for this basename: GLUCAP,  in the last 168 hours     Signed:  [facility] MD [facility] 12/06/2013, 10:16 AM

## 2013-12-06 NOTE — Progress Notes (Signed)
Subjective: No new issues.   Objective: Vital signs in last 24 hours: Temp:  [97.5 F (36.4 C)-98.7 F (37.1 C)] 98.6 F (37 C) (01/20 0531) Pulse Rate:  [64-92] 85 (01/20 0531) Resp:  [13-18] 17 (01/20 0531) BP: (124-160)/(86-107) 135/94 mmHg (01/20 0531) SpO2:  [100 %] 100 % (01/20 0531)  Physical Exam  Constitutional: He is oriented to person, place, and time. He appears well-developed. No distress.  HENT:  Head: Normocephalic and atraumatic.  Right facial swelling has resolved. Mouth/Throat: Oropharynx is clear and moist. No oropharyngeal exudate.  No drainage from oral incision site.  Trismus mostly resolved. Eyes: Conjunctivae and EOM are normal. Pupils are equal, round, and reactive to light.  Neck: Normal range of motion. Neck supple.  Neurological: He is alert and oriented to person, place, and time. No cranial nerve deficit. He exhibits normal muscle tone. Coordination normal.  Skin: Skin is warm. No erythema.    Recent Labs  12/05/13 0600 12/06/13 0355  WBC 6.2 9.4  HGB 13.5 13.0  HCT 40.1 37.9*  PLT 216 251    Recent Labs  12/05/13 0600 12/06/13 0355  NA 140 138  K 4.6 4.4  CL 102 101  CO2 24 26  GLUCOSE 90 145*  BUN 8 13  CREATININE 0.92 0.88  CALCIUM 9.0 8.9    Medications:  I have reviewed the patient's current medications. Scheduled: . clindamycin (CLEOCIN) IV  600 mg Intravenous Q8H  . docusate sodium  200 mg Oral QHS  . nicotine  21 mg Transdermal Q24H   EVO:[id number], acetaminophen, diphenhydrAMINE, morphine injection, ondansetron (ZOFRAN) IV, ondansetron, oxyCODONE  Assessment/Plan: Right perimandibular abscess.  Significantly improved s/p I&D and removal of plate and screws. Mandible well healed from previous fracture.  May d/c home on clindamycin.  Pt may f/u with me as an outpatient in 2 weeks.   LOS: 2 days   Billye Pickerel,SUI W 12/06/2013, 9:22 AM

## 2013-12-06 NOTE — Discharge Planning (Signed)
Patient discharged home in stable condition. Verbalizes understanding of all discharge instructions, including home medications and follow up appointments. 

## 2013-12-06 NOTE — Progress Notes (Signed)
Pt planned for d/c home today. Given letter, Rx and pharmacy list for [facility] program and process explained to patient. He stated understanding.   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  This was written and given to patient.   Pt with no primary care. Given the address, phone and hours for the [facility] and [facility] at [address]

## 2013-12-07 LAB — CULTURE, ROUTINE-ABSCESS
CULTURE: NORMAL
Gram Stain: NONE SEEN

## 2013-12-08 ENCOUNTER — Encounter (HOSPITAL_COMMUNITY): Payer: Self-pay | Admitting: Otolaryngology

## 2013-12-10 LAB — ANAEROBIC CULTURE: GRAM STAIN: NONE SEEN

## 2013-12-29 ENCOUNTER — Encounter (HOSPITAL_COMMUNITY): Payer: Self-pay | Admitting: Otolaryngology

## 2013-12-29 NOTE — OR Nursing (Signed)
Updated procedure ending time in operative record

## 2013-12-31 ENCOUNTER — Emergency Department (HOSPITAL_COMMUNITY)
Admission: EM | Admit: 2013-12-31 | Discharge: 2013-12-31 | Disposition: A | Discharge status: Home / Self Care | Payer: Medicaid Other | Attending: Emergency Medicine | Admitting: Emergency Medicine

## 2013-12-31 ENCOUNTER — Encounter (HOSPITAL_COMMUNITY): Payer: Self-pay | Admitting: Emergency Medicine

## 2013-12-31 DIAGNOSIS — F10929 Alcohol use, unspecified with intoxication, unspecified: Secondary | ICD-10-CM

## 2013-12-31 DIAGNOSIS — E162 Hypoglycemia, unspecified: Secondary | ICD-10-CM | POA: Diagnosis not present

## 2013-12-31 DIAGNOSIS — R4182 Altered mental status, unspecified: Secondary | ICD-10-CM | POA: Diagnosis not present

## 2013-12-31 DIAGNOSIS — F1010 Alcohol abuse, uncomplicated: Secondary | ICD-10-CM | POA: Diagnosis not present

## 2013-12-31 DIAGNOSIS — I10 Essential (primary) hypertension: Secondary | ICD-10-CM | POA: Diagnosis not present

## 2013-12-31 DIAGNOSIS — F17200 Nicotine dependence, unspecified, uncomplicated: Secondary | ICD-10-CM | POA: Diagnosis not present

## 2013-12-31 DIAGNOSIS — Z8659 Personal history of other mental and behavioral disorders: Secondary | ICD-10-CM | POA: Insufficient documentation

## 2013-12-31 HISTORY — DX: Alcohol abuse, uncomplicated: F10.10

## 2013-12-31 HISTORY — DX: Essential (primary) hypertension: I10

## 2013-12-31 LAB — GLUCOSE, CAPILLARY
Glucose-Capillary: 77 mg/dL (ref 70–99)
Glucose-Capillary: 65 mg/dL — ABNORMAL LOW (ref 70–99)

## 2013-12-31 LAB — ETHANOL: Alcohol, Ethyl (B): 237 mg/dL — ABNORMAL HIGH (ref 0–11)

## 2013-12-31 NOTE — ED Notes (Signed)
EMS called by security at [facility] regarding pt unresponsive. Woke up to painful stimuli, Fire checked CBG 46 swallowed 26 grams of glucose, admitted ETOH use. Next CBG 61. Transported to ED BP 100/60-80-14

## 2013-12-31 NOTE — ED Notes (Signed)
Pt ambulatory to the bathroom with no assistance and with steady gait

## 2013-12-31 NOTE — ED Notes (Signed)
Bed: WA20 Expected date:  Expected time:  Means of arrival:  Comments: EMS-hypoglycemia 

## 2013-12-31 NOTE — ED Provider Notes (Signed)
Care assumed from Dr. Dorna Mai.  33 yo male with acute alcohol intoxication.  After metabolizing in the ED, pt was clinically sober, well appearing, denied any traumatic injuries, tolerated PO fluids and food, ambulated without difficulty, and was discharged.  Clinical Impression: 1. Alcohol intoxication   2. Hypoglycemia       Arthur Siren III, MD 12/31/13 [phone]

## 2013-12-31 NOTE — ED Provider Notes (Signed)
CSN: [id number]     Arrival date & time 12/31/13  1406 History   First MD Initiated Contact with Patient 12/31/13 1432     Chief Complaint  Patient presents with  . Hypoglycemia  . Alcohol Intoxication     (Consider location/radiation/quality/duration/timing/severity/associated sxs/prior Treatment) HPI Comments: Level 5 caveat due to altered mentation and alcohol intoxication . Pt was found passed out, unresponsive in parking lot of [facility].  EMS found blood sugar to be 46, responded to oral glucose, came up, pt admitted to being intoxicated.  Pt has h/o alcohol abuse.  Pt is currently sleepgin, sluggish, but will arouse and only shook his head no when I asked if he was asleep.  Then he fell back asleep.    Patient is a 33 y.o. male presenting with hypoglycemia and intoxication. The history is provided by the EMS personnel. The history is limited by the condition of the patient.  Hypoglycemia Initial blood sugar:  46 Blood sugar after intervention:  61 Risk factors: alcohol abuse   Risk factors: no uncontrolled diabetes   Alcohol Intoxication    Past Medical History  Diagnosis Date  . Depression   . Bipolar disorder   . Auditory hallucinations   . Retained orthopedic hardware     mandible; unable to open mouth wide  . Hypertension   . ETOH abuse    Past Surgical History  Procedure Laterality Date  . Orif mandibular fracture  08/18/2012    Procedure: OPEN REDUCTION INTERNAL FIXATION (ORIF) MANDIBULAR FRACTURE;  Surgeon: Ascencion Dike, MD;  Location: [facility];  Service: ENT;  Laterality: N/A;  . Mandibular hardware removal  09/27/2012    Procedure: MANDIBULAR HARDWARE REMOVAL;  Surgeon: Ascencion Dike, MD;  Location: [facility];  Service: ENT;  Laterality: N/A;  REMOVAL OF MMF HARDWARE  . Mandibular hardware removal N/A 12/05/2013    Procedure: MANDIBULAR HARDWARE REMOVAL Irrigation and  dedridement;  Surgeon: Ascencion Dike, MD;  Location: [facility];  Service: ENT;  Laterality:  N/A;   History reviewed. No pertinent family history. History  Substance Use Topics  . Smoking status: Current Every Day Smoker -- 1.00 packs/day    Types: Cigarettes  . Smokeless tobacco: Never Used  . Alcohol Use: Yes    Review of Systems  Unable to perform ROS: Mental status change      Allergies  Penicillins  Home Medications  No current outpatient prescriptions on file. BP 115/71  Pulse 80  Temp(Src) 97.3 F (36.3 C) (Oral)  Resp 14  SpO2 100% Physical Exam  Nursing note and vitals reviewed. Constitutional: He appears well-developed and well-nourished. He appears listless. He is sleeping.  Non-toxic appearance. He does not have a sickly appearance. He does not appear ill. No distress.  HENT:  Head: Normocephalic and atraumatic.  Mouth/Throat: Mucous membranes are not dry.  Eyes: Conjunctivae are normal.  Neck: Full passive range of motion without pain.  Pulmonary/Chest: Effort normal. No stridor.  Abdominal: Soft. He exhibits no distension. There is no tenderness.  Neurological: He appears listless.  Skin: Skin is warm. He is not diaphoretic.    ED Course  Procedures (including critical care time) Labs Review Labs Reviewed  GLUCOSE, CAPILLARY  ETHANOL  I-STAT CHEM 8, ED   Imaging Review No results found.   RA sat is 100% and I interpret to be normal   3:12 PM Pt roused again, continues to sleep soundly.  No airway compromise.  Will sign out to Dr. Doy Mince  to monitor for clinical improvement or a ride home by responsible adult.     MDM   Final diagnoses:  Alcohol intoxication  Hypoglycemia    Pt with h/o alcohol abuse in the past, only weighs 120 lbs, likely little glycogen stores.  Pt's glucose was 77 upon arrival.  Will check an ETOH.  Pt with intact airway, sleeping, likely related to alcohol use.  Will need to be monitored to ensure recurrent hypoglycemia doesn't re-occur.  Once more sober, would allow to eat and likely can be discharged to  home provided no other issues found.      Saddie Benders. Ezequias Lard, MD 12/31/13 1514

## 2013-12-31 NOTE — Discharge Instructions (Signed)
Alcohol Problems °Most adults who drink alcohol drink in moderation (not a lot) are at low risk for developing problems related to their drinking. However, all drinkers, including low-risk drinkers, should know about the health risks connected with drinking alcohol. °RECOMMENDATIONS FOR LOW-RISK DRINKING  °Drink in moderation. Moderate drinking is defined as follows:  °· Men - no more than 2 drinks per day. °· Nonpregnant women - no more than 1 drink per day. °· Over age 65 - no more than 1 drink per day. °A standard drink is 12 grams of pure alcohol, which is equal to a 12 ounce bottle of beer or wine cooler, a 5 ounce glass of wine, or 1.5 ounces of distilled spirits (such as whiskey, brandy, vodka, or rum).  °ABSTAIN FROM (DO NOT DRINK) ALCOHOL: °· When pregnant or considering pregnancy. °· When taking a medication that interacts with alcohol. °· If you are alcohol dependent. °· A medical condition that prohibits drinking alcohol (such as ulcer, liver disease, or heart disease). °DISCUSS WITH YOUR CAREGIVER: °· If you are at risk for coronary heart disease, discuss the potential benefits and risks of alcohol use: Light to moderate drinking is associated with lower rates of coronary heart disease in certain populations (for example, men over age 45 and postmenopausal women). Infrequent or nondrinkers are advised not to begin light to moderate drinking to reduce the risk of coronary heart disease so as to avoid creating an alcohol-related problem. Similar protective effects can likely be gained through proper diet and exercise. °· Women and the elderly have smaller amounts of body water than men. As a result women and the elderly achieve a higher blood alcohol concentration after drinking the same amount of alcohol. °· Exposing a fetus to alcohol can cause a broad range of birth defects referred to as Fetal Alcohol Syndrome (FAS) or Alcohol-Related Birth Defects (ARBD). Although FAS/ARBD is connected with excessive  alcohol consumption during pregnancy, studies also have reported neurobehavioral problems in infants born to mothers reporting drinking an average of 1 drink per day during pregnancy. °· Heavier drinking (the consumption of more than 4 drinks per occasion by men and more than 3 drinks per occasion by women) impairs learning (cognitive) and psychomotor functions and increases the risk of alcohol-related problems, including accidents and injuries. °CAGE QUESTIONS:  °· Have you ever felt that you should Cut down on your drinking? °· Have people Annoyed you by criticizing your drinking? °· Have you ever felt bad or Guilty about your drinking? °· Have you ever had a drink first thing in the morning to steady your nerves or get rid of a hangover (Eye opener)? °If you answered positively to any of these questions: You may be at risk for alcohol-related problems if alcohol consumption is:  °· Men: Greater than 14 drinks per week or more than 4 drinks per occasion. °· Women: Greater than 7 drinks per week or more than 3 drinks per occasion. °Do you or your family have a medical history of alcohol-related problems, such as: °· Blackouts. °· Sexual dysfunction. °· Depression. °· Trauma. °· Liver dysfunction. °· Sleep disorders. °· Hypertension. °· Chronic abdominal pain. °· Has your drinking ever caused you problems, such as problems with your family, problems with your work (or school) performance, or accidents/injuries? °· Do you have a compulsion to drink or a preoccupation with drinking? °· Do you have poor control or are you unable to stop drinking once you have started? °· Do you have to drink to   avoid withdrawal symptoms?  Do you have problems with withdrawal such as tremors, nausea, sweats, or mood disturbances?  Does it take more alcohol than in the past to get you high?  Do you feel a strong urge to drink?  Do you change your plans so that you can have a drink?  Do you ever drink in the morning to relieve  the shakes or a hangover? If you have answered a number of the previous questions positively, it may be time for you to talk to your caregivers, family, and friends and see if they think you have a problem. Alcoholism is a chemical dependency that keeps getting worse and will eventually destroy your health and relationships. Many alcoholics end up dead, impoverished, or in prison. This is often the end result of all chemical dependency.  Do not be discouraged if you are not ready to take action immediately.  Decisions to change behavior often involve up and down desires to change and feeling like you cannot decide.  Try to think more seriously about your drinking behavior.  Think of the reasons to quit. WHERE TO GO FOR ADDITIONAL INFORMATION   The [place] on Alcohol Abuse and Alcoholism (NIAAA) http://www.bradshaw.com/  [employer] on Alcoholism and Drug Dependence (NCADD) www.ncadd.[place] (ASAM) http://carpenter.net/  Document Released: 11/03/2005 Document Revised: 01/26/2012 Document Reviewed: 06/21/2008 [facility] Patient Information 2014 [place].    Hypoglycemia (Low Blood Sugar) Hypoglycemia is when the glucose (sugar) in your blood is too low. Hypoglycemia can happen for many reasons. It can happen to people with or without diabetes. Hypoglycemia can develop quickly and can be a medical emergency.  CAUSES  Having hypoglycemia does not mean that you will develop diabetes. Different causes include:  Missed or delayed meals or not enough carbohydrates eaten.  Medication overdose. This could be by accident or deliberate. If by accident, your medication may need to be adjusted or changed.  Exercise or increased activity without adjustments in carbohydrates or medications.  A nerve disorder that affects body functions like your heart rate, blood pressure and digestion (autonomic neuropathy).  A condition where the stomach muscles do not  function properly (gastroparesis). Therefore, medications may not absorb properly.  The inability to recognize the signs of hypoglycemia (hypoglycemic unawareness).  Absorption of insulin  may be altered.  Alcohol consumption.  Pregnancy/menstrual cycles/postpartum. This may be due to hormones.  Certain kinds of tumors. This is very rare. SYMPTOMS   Sweating.  Hunger.  Dizziness.  Blurred vision.  Drowsiness.  Weakness.  Headache.  Rapid heart beat.  Shakiness.  Nervousness. DIAGNOSIS  Diagnosis is made by monitoring blood glucose in one or all of the following ways:  Fingerstick blood glucose monitoring.  Laboratory results. TREATMENT  If you think your blood glucose is low:  Check your blood glucose, if possible. If it is less than 70 mg/dl, take one of the following:  3-4 glucose tablets.   cup juice (prefer clear like apple).   cup "regular" soda pop.  1 cup milk.  -1 tube of glucose gel.  5-6 hard candies.  Do not over treat because your blood glucose (sugar) will only go too high.  Wait 15 minutes and recheck your blood glucose. If it is still less than 70 mg/dl (or below your target range), repeat treatment.  Eat a snack if it is more than one hour until your next meal. Sometimes, your blood glucose may go so low that you are unable to  treat yourself. You may need someone to help you. You may even pass out or be unable to swallow. This may require you to get an injection of glucagon, which raises the blood glucose. HOME CARE INSTRUCTIONS  Check blood glucose as recommended by your caregiver.  Take medication as prescribed by your caregiver.  Follow your meal plan. Do not skip meals. Eat on time.  If you are going to drink alcohol, drink it only with meals.  Check your blood glucose before driving.  Check your blood glucose before and after exercise. If you exercise longer or different than usual, be sure to check blood glucose more  frequently.  Always carry treatment with you. Glucose tablets are the easiest to carry.  Always wear medical alert jewelry or carry some form of identification that states that you have diabetes. This will alert people that you have diabetes. If you have hypoglycemia, they will have a better idea on what to do. SEEK MEDICAL CARE IF:   You are having problems keeping your blood sugar at target range.  You are having frequent episodes of hypoglycemia.  You feel you might be having side effects from your medicines.  You have symptoms of an illness that is not improving after 3-4 days.  You notice a change in vision or a new problem with your vision. SEEK IMMEDIATE MEDICAL CARE IF:   You are a family member or friend of a person whose blood glucose goes below 70 mg/dl and is accompanied by:  Confusion.  A change in mental status.  The inability to swallow.  Passing out. Document Released: 11/03/2005 Document Revised: 01/26/2012 Document Reviewed: 03/01/2012 [facility] Patient Information 2014 [place], [place].

## 2015-08-14 ENCOUNTER — Other Ambulatory Visit (HOSPITAL_COMMUNITY)
Admission: RE | Admit: 2015-08-14 | Discharge: 2015-08-14 | Disposition: A | Discharge status: Home / Self Care | Payer: Medicaid Other | Source: Ambulatory Visit | Attending: Emergency Medicine | Admitting: Emergency Medicine

## 2015-08-14 ENCOUNTER — Emergency Department (INDEPENDENT_AMBULATORY_CARE_PROVIDER_SITE_OTHER)
Admission: EM | Admit: 2015-08-14 | Discharge: 2015-08-14 | Disposition: A | Discharge status: Home / Self Care | Payer: Medicaid Other | Source: Home / Self Care | Attending: Emergency Medicine | Admitting: Emergency Medicine

## 2015-08-14 ENCOUNTER — Encounter (HOSPITAL_COMMUNITY): Payer: Self-pay | Admitting: Emergency Medicine

## 2015-08-14 DIAGNOSIS — R300 Dysuria: Secondary | ICD-10-CM | POA: Diagnosis not present

## 2015-08-14 DIAGNOSIS — Z113 Encounter for screening for infections with a predominantly sexual mode of transmission: Secondary | ICD-10-CM | POA: Insufficient documentation

## 2015-08-14 DIAGNOSIS — H6123 Impacted cerumen, bilateral: Secondary | ICD-10-CM

## 2015-08-14 DIAGNOSIS — H6093 Unspecified otitis externa, bilateral: Secondary | ICD-10-CM

## 2015-08-14 LAB — POCT URINALYSIS DIP (DEVICE)
Glucose, UA: NEGATIVE mg/dL
HGB URINE DIPSTICK: NEGATIVE
KETONES UR: NEGATIVE mg/dL
Leukocytes, UA: NEGATIVE
Nitrite: NEGATIVE
PH: 5.5 (ref 5.0–8.0)
PROTEIN: 30 mg/dL — AB
Specific Gravity, Urine: >=1.03 (ref 1.005–1.030)
Urobilinogen, UA: 0.2 mg/dL (ref 0.0–1.0)

## 2015-08-14 MED ORDER — AZITHROMYCIN 250 MG PO TABS
1000.0000 mg | ORAL_TABLET | Freq: Once | ORAL | Status: AC
  Administered 2015-08-14: 1000 mg via ORAL

## 2015-08-14 MED ORDER — LIDOCAINE HCL (PF) 1 % IJ SOLN
INTRAMUSCULAR | Status: AC
  Filled 2015-08-14: qty 5

## 2015-08-14 MED ORDER — CIPROFLOXACIN-DEXAMETHASONE 0.3-0.1 % OT SUSP: 4.0000 [drp] | Freq: Two times a day (BID) | OTIC | Status: DC

## 2015-08-14 MED ORDER — CEFTRIAXONE SODIUM 250 MG IJ SOLR
250.0000 mg | Freq: Once | INTRAMUSCULAR | Status: AC
  Administered 2015-08-14: 250 mg via INTRAMUSCULAR

## 2015-08-14 MED ORDER — AZITHROMYCIN 250 MG PO TABS
ORAL_TABLET | ORAL | Status: AC
  Filled 2015-08-14: qty 4

## 2015-08-14 MED ORDER — CEFTRIAXONE SODIUM 250 MG IJ SOLR
INTRAMUSCULAR | Status: AC
  Filled 2015-08-14: qty 250

## 2015-08-14 NOTE — Discharge Instructions (Signed)
We treated you for gonorrhea and Chlamydia today. We will call you if any of your testing comes back positive. No sex for one week. Please wear condoms. Follow-up as needed.  You have a mild infection of your ear canals called swimmers ear. Use the eardrops twice a day for one week. You still have a big clump of earwax in your left ear. If the antibiotics drops do not loosen it, use olive oil or sweet oil daily. Follow-up as needed.

## 2015-08-14 NOTE — ED Notes (Signed)
Patient is still unable to void at this time for the urine cytology speciment

## 2015-08-14 NOTE — ED Notes (Signed)
Pt states he has had some burning and tingling with erections, but no issue with urination.  He thinks his girlfriend is having symptoms of an STD and he wants to be checked.  She has not been checked or diagnosed with any STD's.  Pt also states he was cleaning his ears out with a match stick five days ago and has had pain in both ears since then.

## 2015-08-14 NOTE — ED Provider Notes (Signed)
CSN: [id number]     Arrival date & time 08/14/15  1650 History   First MD Initiated Contact with Patient 08/14/15 1707     Chief Complaint  Patient presents with  . Exposure to STD  . Otalgia   (Consider location/radiation/quality/duration/timing/severity/associated sxs/prior Treatment) HPI He is a 34 year old man here for evaluation of dysuria and bilateral ear pain. He states he's had some burning with urination as well as lower abdominal pain for the last day or 2. He did see some clear discharge this morning. No fevers or chills. No nausea or vomiting. He was also cleaning out his ears 5 days ago with a matchstick and has been having pain in both ears since that time.  Past Medical History  Diagnosis Date  . Depression   . Bipolar disorder   . Auditory hallucinations   . Retained orthopedic hardware     mandible; unable to open mouth wide  . Hypertension   . ETOH abuse    Past Surgical History  Procedure Laterality Date  . Orif mandibular fracture  08/18/2012    Procedure: OPEN REDUCTION INTERNAL FIXATION (ORIF) MANDIBULAR FRACTURE;  Surgeon: Ascencion Dike, MD;  Location: [place];  Service: ENT;  Laterality: N/A;  . Mandibular hardware removal  09/27/2012    Procedure: MANDIBULAR HARDWARE REMOVAL;  Surgeon: Ascencion Dike, MD;  Location: [place];  Service: ENT;  Laterality: N/A;  REMOVAL OF MMF HARDWARE  . Mandibular hardware removal N/A 12/05/2013    Procedure: MANDIBULAR HARDWARE REMOVAL Irrigation and  dedridement;  Surgeon: Ascencion Dike, MD;  Location: [place];  Service: ENT;  Laterality: N/A;   History reviewed. No pertinent family history. Social History  Substance Use Topics  . Smoking status: Current Every Day Smoker -- 0.50 packs/day    Types: Cigarettes  . Smokeless tobacco: Never Used  . Alcohol Use: Yes    Review of Systems As in history of present illness Allergies  Penicillins  Home Medications   Prior to Admission medications   Medication Sig  Start Date End Date Taking? Authorizing Provider  ciprofloxacin-dexamethasone (CIPRODEX) otic suspension Place 4 drops into both ears 2 (two) times daily. For 7 days 08/14/15   Melony Overly, MD   Meds Ordered and Administered this Visit   Medications  azithromycin [facility]) tablet 1,000 mg (1,000 mg Oral Given 08/14/15 1730)  cefTRIAXone (ROCEPHIN) injection 250 mg (250 mg Intramuscular Given 08/14/15 1735)    BP 143/93 mmHg  Pulse 75  Temp(Src) 98.5 F (36.9 C) (Oral)  SpO2 100% No data found.   Physical Exam  Constitutional: He is oriented to person, place, and time. He appears well-developed and well-nourished. No distress.  HENT:  Bilateral cerumen impaction. There is some pain bilaterally with manipulation of the pinna. Ear canal is slightly swollen, particularly on the left.  Neck: Neck supple.  Cardiovascular: Normal rate.   Pulmonary/Chest: Effort normal.  Neurological: He is alert and oriented to person, place, and time.    ED Course  Procedures (including critical care time)  Labs Review Labs Reviewed  POCT URINALYSIS DIP (DEVICE) - Abnormal; Notable for the following:    Bilirubin Urine SMALL (*)    Protein, ur 30 (*)    All other components within normal limits  URINE CULTURE  HIV ANTIBODY (ROUTINE TESTING)  RPR  URINE CYTOLOGY ANCILLARY ONLY    Imaging Review No results found.    MDM   1. Dysuria   2. Otitis externa, bilateral  3. Cerumen impaction, bilateral    Earwax successfully removed on the right. Ear canal is slightly erythematous and swollen. Left ear canal remains blocked by cerumen. There is some erythema of the ear canal that is visualized.  We'll treat with Ciprodex for 7 days. Recommended use of olive oil or sweet oil to help remove the wax in the left ear.  He was treated presumptively for gonorrhea and Chlamydia today. Blood and urine has been sent for testing. Follow-up as needed.    Melony Overly, MD 08/14/15 [phone]

## 2015-08-15 LAB — URINE CYTOLOGY ANCILLARY ONLY
Chlamydia: NEGATIVE
Neisseria Gonorrhea: NEGATIVE
Trichomonas: NEGATIVE

## 2015-08-15 LAB — RPR: RPR: NONREACTIVE

## 2015-08-15 LAB — URINE CULTURE: CULTURE: NO GROWTH

## 2015-08-15 LAB — HIV ANTIBODY (ROUTINE TESTING W REFLEX): HIV Screen 4th Generation wRfx: NONREACTIVE

## 2015-08-16 NOTE — ED Notes (Signed)
Attempted to call patinet at [facility] provided day of check in . Error message received. Letter sent to address provided day of visit

## 2015-08-17 NOTE — ED Notes (Signed)
Call from patient to check on lab reports. After verifying ID, discussed negative reports

## 2016-04-10 ENCOUNTER — Encounter (HOSPITAL_COMMUNITY): Payer: Self-pay | Admitting: Emergency Medicine

## 2016-04-10 ENCOUNTER — Emergency Department (HOSPITAL_COMMUNITY)
Admission: EM | Admit: 2016-04-10 | Discharge: 2016-04-10 | Disposition: A | Discharge status: Home / Self Care | Payer: No Typology Code available for payment source | Attending: Emergency Medicine | Admitting: Emergency Medicine

## 2016-04-10 DIAGNOSIS — S161XXA Strain of muscle, fascia and tendon at neck level, initial encounter: Secondary | ICD-10-CM | POA: Insufficient documentation

## 2016-04-10 DIAGNOSIS — Y92410 Unspecified street and highway as the place of occurrence of the external cause: Secondary | ICD-10-CM | POA: Diagnosis not present

## 2016-04-10 DIAGNOSIS — S39012A Strain of muscle, fascia and tendon of lower back, initial encounter: Secondary | ICD-10-CM | POA: Diagnosis not present

## 2016-04-10 DIAGNOSIS — F17210 Nicotine dependence, cigarettes, uncomplicated: Secondary | ICD-10-CM | POA: Diagnosis not present

## 2016-04-10 DIAGNOSIS — I10 Essential (primary) hypertension: Secondary | ICD-10-CM | POA: Diagnosis not present

## 2016-04-10 DIAGNOSIS — Y939 Activity, unspecified: Secondary | ICD-10-CM | POA: Diagnosis not present

## 2016-04-10 DIAGNOSIS — S199XXA Unspecified injury of neck, initial encounter: Secondary | ICD-10-CM | POA: Diagnosis present

## 2016-04-10 DIAGNOSIS — Z79891 Long term (current) use of opiate analgesic: Secondary | ICD-10-CM | POA: Insufficient documentation

## 2016-04-10 DIAGNOSIS — Y999 Unspecified external cause status: Secondary | ICD-10-CM | POA: Diagnosis not present

## 2016-04-10 MED ORDER — IBUPROFEN 400 MG PO TABS
800.0000 mg | ORAL_TABLET | Freq: Once | ORAL | Status: AC
  Administered 2016-04-10: 800 mg via ORAL
  Filled 2016-04-10: qty 4

## 2016-04-10 MED ORDER — CYCLOBENZAPRINE HCL 10 MG PO TABS: 10.0000 mg | ORAL_TABLET | Freq: Two times a day (BID) | ORAL | Status: DC | PRN

## 2016-04-10 MED ORDER — IBUPROFEN 800 MG PO TABS: 800.0000 mg | ORAL_TABLET | Freq: Three times a day (TID) | ORAL | Status: DC

## 2016-04-10 MED ORDER — TRAMADOL HCL 50 MG PO TABS: 50.0000 mg | ORAL_TABLET | Freq: Two times a day (BID) | ORAL | Status: DC | PRN

## 2016-04-10 MED ORDER — TRAMADOL HCL 50 MG PO TABS
50.0000 mg | ORAL_TABLET | Freq: Once | ORAL | Status: AC
  Administered 2016-04-10: 50 mg via ORAL
  Filled 2016-04-10: qty 1

## 2016-04-10 MED ORDER — CYCLOBENZAPRINE HCL 10 MG PO TABS
10.0000 mg | ORAL_TABLET | Freq: Once | ORAL | Status: AC
  Administered 2016-04-10: 10 mg via ORAL
  Filled 2016-04-10: qty 1
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
## 2016-04-10 NOTE — ED Notes (Signed)
Declined W/C at D/C and was escorted to lobby by RN. 

## 2016-08-07 ENCOUNTER — Encounter (HOSPITAL_COMMUNITY): Payer: Self-pay | Admitting: Emergency Medicine

## 2016-08-07 DIAGNOSIS — Z5321 Procedure and treatment not carried out due to patient leaving prior to being seen by health care provider: Secondary | ICD-10-CM | POA: Insufficient documentation

## 2016-08-07 DIAGNOSIS — I10 Essential (primary) hypertension: Secondary | ICD-10-CM | POA: Diagnosis not present

## 2016-08-07 DIAGNOSIS — M62838 Other muscle spasm: Secondary | ICD-10-CM | POA: Insufficient documentation

## 2016-08-07 DIAGNOSIS — R1110 Vomiting, unspecified: Secondary | ICD-10-CM | POA: Insufficient documentation

## 2016-08-07 DIAGNOSIS — R51 Headache: Secondary | ICD-10-CM | POA: Insufficient documentation

## 2016-08-07 DIAGNOSIS — F17210 Nicotine dependence, cigarettes, uncomplicated: Secondary | ICD-10-CM | POA: Diagnosis not present

## 2016-08-07 LAB — CBC
HEMATOCRIT: 49.8 % (ref 39.0–52.0)
HEMOGLOBIN: 17.2 g/dL — AB (ref 13.0–17.0)
MCH: 30.2 pg (ref 26.0–34.0)
MCHC: 34.5 g/dL (ref 30.0–36.0)
MCV: 87.4 fL (ref 78.0–100.0)
Platelets: 224 10*3/uL (ref 150–400)
RBC: 5.7 MIL/uL (ref 4.22–5.81)
RDW: 13.4 % (ref 11.5–15.5)
WBC: 11.6 10*3/uL — ABNORMAL HIGH (ref 4.0–10.5)

## 2016-08-07 LAB — COMPREHENSIVE METABOLIC PANEL
ALBUMIN: 5.5 g/dL — AB (ref 3.5–5.0)
ALT: 31 U/L (ref 17–63)
ANION GAP: 17 — AB (ref 5–15)
AST: 49 U/L — ABNORMAL HIGH (ref 15–41)
Alkaline Phosphatase: 89 U/L (ref 38–126)
BUN: 39 mg/dL — ABNORMAL HIGH (ref 6–20)
CALCIUM: 10.7 mg/dL — AB (ref 8.9–10.3)
CO2: 20 mmol/L — AB (ref 22–32)
Chloride: 95 mmol/L — ABNORMAL LOW (ref 101–111)
Creatinine, Ser: 2.85 mg/dL — ABNORMAL HIGH (ref 0.61–1.24)
GFR calc non Af Amer: 27 mL/min — ABNORMAL LOW (ref >60)
GFR, EST AFRICAN AMERICAN: 31 mL/min — AB (ref >60)
GLUCOSE: 119 mg/dL — AB (ref 65–99)
POTASSIUM: 4.8 mmol/L (ref 3.5–5.1)
SODIUM: 132 mmol/L — AB (ref 135–145)
Total Bilirubin: 0.8 mg/dL (ref 0.3–1.2)
Total Protein: 9.3 g/dL — ABNORMAL HIGH (ref 6.5–8.1)

## 2016-08-07 LAB — LIPASE, BLOOD: LIPASE: 25 U/L (ref 11–51)

## 2016-08-07 NOTE — ED Triage Notes (Signed)
Pt here with severe headache and muscle spasms starting today. Pt reports he was working outside today and has also had 3 episodes of emesis. Pt a/o x 4.

## 2016-08-08 ENCOUNTER — Emergency Department (HOSPITAL_COMMUNITY)
Admission: EM | Admit: 2016-08-08 | Discharge: 2016-08-08 | Disposition: A | Discharge status: Home / Self Care | Payer: Medicaid Other | Attending: Emergency Medicine | Admitting: Emergency Medicine

## 2016-08-08 NOTE — ED Notes (Signed)
Pt stated they no longer wanted to wait and left out ED doors.

## 2016-08-08 NOTE — ED Notes (Signed)
Pt stated

## 2016-09-04 ENCOUNTER — Encounter (HOSPITAL_COMMUNITY): Payer: Self-pay | Admitting: Family Medicine

## 2016-09-04 ENCOUNTER — Ambulatory Visit (HOSPITAL_COMMUNITY)
Admission: EM | Admit: 2016-09-04 | Discharge: 2016-09-04 | Disposition: A | Discharge status: Home / Self Care | Payer: Medicaid Other | Attending: Family Medicine | Admitting: Family Medicine

## 2016-09-04 DIAGNOSIS — S39012A Strain of muscle, fascia and tendon of lower back, initial encounter: Secondary | ICD-10-CM

## 2016-09-04 DIAGNOSIS — I10 Essential (primary) hypertension: Secondary | ICD-10-CM

## 2016-09-04 DIAGNOSIS — J40 Bronchitis, not specified as acute or chronic: Secondary | ICD-10-CM | POA: Diagnosis not present

## 2016-09-04 MED ORDER — PREDNISONE 20 MG PO TABS: ORAL_TABLET | ORAL | 0 refills | Status: DC

## 2016-09-04 MED ORDER — AMLODIPINE BESYLATE 5 MG PO TABS: 5.0000 mg | ORAL_TABLET | Freq: Every day | ORAL | 11 refills | Status: DC

## 2016-09-04 MED ORDER — LEVOFLOXACIN 500 MG PO TABS: 500.0000 mg | ORAL_TABLET | Freq: Every day | ORAL | 0 refills | Status: DC

## 2016-09-04 NOTE — ED Provider Notes (Signed)
[facility]    CSN: [id number] Arrival date & time: 09/04/16  [id number]     History   Chief Complaint Chief Complaint  Patient presents with  . Cough  . Back Pain    HPI Arthur Beasley is a 35 y.o. male.   35 yo man here for cough and back pain. Married and has 2 children. He works Lawyer.  Patient smokes a pack to 2 packs every day.  Patient's had acute bronchitis in the past and now has had one week of minimally productive cough with wheezing. He had a spell where he became quite weak week ago. He's never had a problem with blood pressure to his knowledge. He's never had a heart problem.  Patient complains of some intermittent right scapular pain and low back pain. He has some numbness occasionally in his toes and in his right middle finger. He's had no difficulty with his bladder or bowels.  Patient has had some night sweats. He would like to quit smoking. He's been trying to make an appointment with his primary care doctor but has not been able to get in.      Past Medical History:  Diagnosis Date  . Auditory hallucinations   . Bipolar disorder ([place])   . Depression   . ETOH abuse   . Hypertension   . Retained orthopedic hardware    mandible; unable to open mouth wide    Patient Active Problem List   Diagnosis Date Noted  . Bipolar disorder, unspecified 12/05/2013  . Dysuria 12/05/2013  . 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 Abscess, jaw 12/04/2013    Past Surgical History:  Procedure Laterality Date  . MANDIBULAR HARDWARE REMOVAL  09/27/2012   Procedure: MANDIBULAR HARDWARE REMOVAL;  Surgeon: Ascencion Dike, MD;  Location: [facility];  Service: ENT;  Laterality: N/A;  REMOVAL OF MMF HARDWARE  . MANDIBULAR HARDWARE REMOVAL N/A 12/05/2013   Procedure: MANDIBULAR HARDWARE REMOVAL Irrigation and  dedridement;  Surgeon: Ascencion Dike, MD;  Location: [facility];  Service: ENT;  Laterality: N/A;    . ORIF MANDIBULAR FRACTURE  08/18/2012   Procedure: OPEN REDUCTION INTERNAL FIXATION (ORIF) MANDIBULAR FRACTURE;  Surgeon: Ascencion Dike, MD;  Location: [facility];  Service: ENT;  Laterality: N/A;       Home Medications    Prior to Admission medications   Medication Sig Start Date End Date Taking? Authorizing Provider  amLODipine (NORVASC) 5 MG tablet Take 1 tablet (5 mg total) by mouth daily. 09/04/16   Robyn Haber, MD  levofloxacin (LEVAQUIN) 500 MG tablet Take 1 tablet (500 mg total) by mouth daily. 09/04/16   Robyn Haber, MD  predniSONE (DELTASONE) 20 MG tablet Two daily with food 09/04/16   Robyn Haber, MD    Family History History reviewed. No pertinent family history.  Social History Social History  Substance Use Topics  . Smoking status: Current Every Day Smoker    Packs/day: 1.00    Types: Cigarettes  . Smokeless tobacco: Never Used  . Alcohol use No     Allergies   Penicillins   Review of Systems Review of Systems  Constitutional: Positive for chills, diaphoresis and fatigue. Negative for fever.  HENT: Positive for congestion.   Eyes: Negative.   Respiratory: Positive for cough and wheezing.   Cardiovascular: Positive for chest pain.  Gastrointestinal: Positive for nausea.  Genitourinary: Negative.      Physical Exam Triage Vital  Signs ED Triage Vitals  Enc Vitals Group     BP 09/04/16 1028 (!) 159/111     Pulse Rate 09/04/16 1028 98     Resp 09/04/16 1028 18     Temp 09/04/16 1028 97.9 F (36.6 C)     Temp src --      SpO2 09/04/16 1028 100 %     Weight --      Height --      Head Circumference --      Peak Flow --      Pain Score 09/04/16 1029 7     Pain Loc --      Pain Edu? --      Excl. in [place]? --    No data found.   Updated Vital Signs BP (!) 159/111   Pulse 98   Temp 97.9 F (36.6 C)   Resp 18   SpO2 100%       Physical Exam  Constitutional: He is oriented to person, place, and time. He appears well-developed and  well-nourished. No distress.  HENT:  Head: Normocephalic.  Right Ear: External ear normal.  Left Ear: External ear normal.  Nose: Nose normal.  Mouth/Throat: Oropharynx is clear and moist.  Eyes: Conjunctivae and EOM are normal. Pupils are equal, round, and reactive to light.  Neck: Normal range of motion. Neck supple.  Cardiovascular: Normal rate, regular rhythm and normal heart sounds.   Pulmonary/Chest: Effort normal. He has wheezes.  Abdominal:  Patient has bilateral hernia scars. No hernias present today.  Musculoskeletal: Normal range of motion.  Normal straight leg raising.  Patient has clubbing on his fingers.  Neurological: He is alert and oriented to person, place, and time. No cranial nerve deficit. He exhibits normal muscle tone. Coordination normal.  Skin: Skin is warm and dry.  Nursing note and vitals reviewed.    UC Treatments / Results  Labs (all labs ordered are listed, but only abnormal results are displayed) Labs Reviewed - No data to display  EKG  EKG Interpretation None       Radiology No results found.  Procedures Procedures (including critical care time)  Medications Ordered in UC Medications - No data to display   Initial Impression / Assessment and Plan / UC Course  I have reviewed the triage vital signs and the nursing notes.  Pertinent labs & imaging results that were available during my care of the patient were reviewed by me and considered in my medical decision making (see chart for details).  Clinical Course      Final Clinical Impressions(s) / UC Diagnoses   Final diagnoses:  Bronchitis  Strain of lumbar region, initial encounter  Essential hypertension    New Prescriptions New Prescriptions   AMLODIPINE (NORVASC) 5 MG TABLET    Take 1 tablet (5 mg total) by mouth daily.   LEVOFLOXACIN (LEVAQUIN) 500 MG TABLET    Take 1 tablet (500 mg total) by mouth daily.   PREDNISONE (DELTASONE) 20 MG TABLET    Two daily with food       Robyn Haber, MD 09/04/16 1049

## 2016-09-04 NOTE — ED Triage Notes (Signed)
Pt here stating that his BP has been high, his bronchitis is acting up, his lower back has been hurting. sts he also wants to have his hernias looked at.

## 2016-12-10 ENCOUNTER — Encounter (HOSPITAL_COMMUNITY): Payer: Self-pay | Admitting: *Deleted

## 2016-12-10 ENCOUNTER — Emergency Department (HOSPITAL_COMMUNITY): Payer: Medicaid Other

## 2016-12-10 ENCOUNTER — Emergency Department (HOSPITAL_COMMUNITY)
Admission: EM | Admit: 2016-12-10 | Discharge: 2016-12-10 | Disposition: A | Discharge status: Home / Self Care | Payer: Medicaid Other | Attending: Emergency Medicine | Admitting: Emergency Medicine

## 2016-12-10 ENCOUNTER — Emergency Department (HOSPITAL_COMMUNITY)
Admission: EM | Admit: 2016-12-10 | Discharge: 2016-12-10 | Disposition: A | Discharge status: Home / Self Care | Payer: Medicaid Other | Source: Home / Self Care | Attending: Emergency Medicine | Admitting: Emergency Medicine

## 2016-12-10 DIAGNOSIS — F17210 Nicotine dependence, cigarettes, uncomplicated: Secondary | ICD-10-CM | POA: Diagnosis not present

## 2016-12-10 DIAGNOSIS — I10 Essential (primary) hypertension: Secondary | ICD-10-CM | POA: Insufficient documentation

## 2016-12-10 DIAGNOSIS — R6883 Chills (without fever): Secondary | ICD-10-CM | POA: Diagnosis present

## 2016-12-10 DIAGNOSIS — R69 Illness, unspecified: Principal | ICD-10-CM

## 2016-12-10 DIAGNOSIS — J111 Influenza due to unidentified influenza virus with other respiratory manifestations: Secondary | ICD-10-CM | POA: Diagnosis not present

## 2016-12-10 DIAGNOSIS — E860 Dehydration: Secondary | ICD-10-CM

## 2016-12-10 LAB — BASIC METABOLIC PANEL
ANION GAP: 14 (ref 5–15)
BUN: 7 mg/dL (ref 6–20)
CO2: 23 mmol/L (ref 22–32)
Calcium: 9.3 mg/dL (ref 8.9–10.3)
Chloride: 102 mmol/L (ref 101–111)
Creatinine, Ser: 0.73 mg/dL (ref 0.61–1.24)
GFR calc Af Amer: >60 mL/min (ref >60)
GLUCOSE: 107 mg/dL — AB (ref 65–99)
POTASSIUM: 3.9 mmol/L (ref 3.5–5.1)
Sodium: 139 mmol/L (ref 135–145)

## 2016-12-10 LAB — I-STAT TROPONIN, ED: Troponin i, poc: 0 ng/mL (ref 0.00–0.08)

## 2016-12-10 LAB — CBC
HEMATOCRIT: 43.7 % (ref 39.0–52.0)
HEMOGLOBIN: 15.1 g/dL (ref 13.0–17.0)
MCH: 30.1 pg (ref 26.0–34.0)
MCHC: 34.6 g/dL (ref 30.0–36.0)
MCV: 87.1 fL (ref 78.0–100.0)
Platelets: 215 10*3/uL (ref 150–400)
RBC: 5.02 MIL/uL (ref 4.22–5.81)
RDW: 14.1 % (ref 11.5–15.5)
WBC: 5.3 10*3/uL (ref 4.0–10.5)

## 2016-12-10 LAB — MAGNESIUM: MAGNESIUM: 1.8 mg/dL (ref 1.7–2.4)

## 2016-12-10 MED ORDER — ONDANSETRON 4 MG PO TBDP: 4.0000 mg | ORAL_TABLET | Freq: Three times a day (TID) | ORAL | 0 refills | Status: DC | PRN

## 2016-12-10 MED ORDER — BENZONATATE 100 MG PO CAPS: 100.0000 mg | ORAL_CAPSULE | Freq: Three times a day (TID) | ORAL | 0 refills | Status: DC

## 2016-12-10 MED ORDER — ONDANSETRON 4 MG PO TBDP
4.0000 mg | ORAL_TABLET | Freq: Once | ORAL | Status: AC
  Administered 2016-12-10: 4 mg via ORAL
  Filled 2016-12-10: qty 1

## 2016-12-10 MED ORDER — IBUPROFEN 800 MG PO TABS: 800.0000 mg | ORAL_TABLET | Freq: Three times a day (TID) | ORAL | 0 refills | Status: DC

## 2016-12-10 MED ORDER — MECLIZINE HCL 25 MG PO TABS
25.0000 mg | ORAL_TABLET | Freq: Once | ORAL | Status: AC
  Administered 2016-12-10: 25 mg via ORAL
  Filled 2016-12-10: qty 1

## 2016-12-10 MED ORDER — SODIUM CHLORIDE 0.9 % IV BOLUS (SEPSIS)
1000.0000 mL | Freq: Once | INTRAVENOUS | Status: AC
Start: 2016-12-10 — End: 2016-12-10
  Administered 2016-12-10: 1000 mL via INTRAVENOUS

## 2016-12-10 NOTE — ED Notes (Signed)
See EDP assessment 

## 2016-12-10 NOTE — ED Triage Notes (Signed)
Pt repots dizziness since he woke this morning. Pt also reports that his BP is high. Pt reports feeling like his heart is racing.

## 2016-12-10 NOTE — Discharge Instructions (Signed)
Your symptoms are consistent with a viral illness (like the flu). Viruses do not require antibiotics. Treatment is symptomatic care and it is important to note that these symptoms may last for 7-14 days. Symptoms will be intensified and complicated by dehydration. Dehydration can also extend the duration of symptoms. Drink plenty of fluids and get plenty of rest. You should be drinking at least half a liter of water an hour to stay hydrated. Electrolyte drinks are also encouraged. You should be drinking enough fluids to make your urine light yellow, almost clear. If this is not the case, you are not drinking enough water. Ibuprofen, Naproxen, or Tylenol for pain or fever. Zofran for nausea. Tessalon for cough. Plain Mucinex may help relieve congestion. Saline sinus rinses and saline nasal sprays may also help relieve congestion. Warm liquids or Chloraseptic spray may help soothe a sore throat. Follow up with a primary care provider, as needed, for any future management of this issue.

## 2016-12-10 NOTE — ED Triage Notes (Signed)
Pt was just discharged from ED sat in front of nurse sts he went out to smoke a cigarette and became dizzy so he wil asking to check back in.

## 2016-12-10 NOTE — ED Provider Notes (Signed)
[facility] DEPT Provider Note   CSN: [id number] Arrival date & time: 12/10/16  1530     History   Chief Complaint Chief Complaint  Patient presents with  . Dizziness    HPI Arthur Beasley is a 36 y.o. male.  HPI Pt comes in with cc of dizziness. Pt reports that she has been drinking heavily the last few days after his wife left him. Today he woke up with dizziness, described as spinning sensation - and decided to come to the ER. No associated new numbness, weakness, vision complains, fainting. Pt denies drug use. He also has elevated BP and states that he hasnt bee taking his BP and psych meds.  Past Medical History:  Diagnosis Date  . Auditory hallucinations   . Bipolar disorder ([place])   . Depression   . ETOH abuse   . Hypertension   . Retained orthopedic hardware    mandible; unable to open mouth wide    Patient Active Problem List   Diagnosis Date Noted  . Bipolar disorder, unspecified 12/05/2013  . Dysuria 12/05/2013  . 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 Abscess, jaw 12/04/2013    Past Surgical History:  Procedure Laterality Date  . MANDIBULAR HARDWARE REMOVAL  09/27/2012   Procedure: MANDIBULAR HARDWARE REMOVAL;  Surgeon: Ascencion Dike, MD;  Location: [facility];  Service: ENT;  Laterality: N/A;  REMOVAL OF MMF HARDWARE  . MANDIBULAR HARDWARE REMOVAL N/A 12/05/2013   Procedure: MANDIBULAR HARDWARE REMOVAL Irrigation and  dedridement;  Surgeon: Ascencion Dike, MD;  Location: [facility];  Service: ENT;  Laterality: N/A;  . ORIF MANDIBULAR FRACTURE  08/18/2012   Procedure: OPEN REDUCTION INTERNAL FIXATION (ORIF) MANDIBULAR FRACTURE;  Surgeon: Ascencion Dike, MD;  Location: [facility];  Service: ENT;  Laterality: N/A;       Home Medications    Prior to Admission medications   Medication Sig Start Date End Date Taking? Authorizing Provider  oxyCODONE (OXYCONTIN) 10 mg 12 hr tablet Take 10 mg by mouth daily as  needed. For pain   Yes Historical Provider, MD  amLODipine (NORVASC) 5 MG tablet Take 1 tablet (5 mg total) by mouth daily. Patient not taking: Reported on 12/10/2016 09/04/16   Robyn Haber, MD  benzonatate (TESSALON) 100 MG capsule Take 1 capsule (100 mg total) by mouth every 8 (eight) hours. 12/10/16   Shawn C Joy, PA-C  ibuprofen (ADVIL,MOTRIN) 800 MG tablet Take 1 tablet (800 mg total) by mouth 3 (three) times daily. 12/10/16   Shawn C Joy, PA-C  levofloxacin (LEVAQUIN) 500 MG tablet Take 1 tablet (500 mg total) by mouth daily. Patient not taking: Reported on 12/10/2016 09/04/16   Robyn Haber, MD  ondansetron (ZOFRAN ODT) 4 MG disintegrating tablet Take 1 tablet (4 mg total) by mouth every 8 (eight) hours as needed for nausea or vomiting. 12/10/16   Lorayne Bender, PA-C  predniSONE (DELTASONE) 20 MG tablet Two daily with food Patient not taking: Reported on 12/10/2016 09/04/16   Robyn Haber, MD    Family History No family history on file.  Social History Social History  Substance Use Topics  . Smoking status: Current Every Day Smoker    Packs/day: 1.00    Types: Cigarettes  . Smokeless tobacco: Never Used  . Alcohol use No     Allergies   Penicillins   Review of Systems Review of Systems  ROS 10 Systems reviewed and are negative for acute change except  as noted in the HPI.     Physical Exam Updated Vital Signs BP 142/82 (BP Location: Right Arm)   Pulse 74   Temp 98.3 F (36.8 C)   Resp 18   SpO2 100%   Physical Exam  Constitutional: He is oriented to person, place, and time. He appears well-developed.  HENT:  Head: Normocephalic and atraumatic.  Eyes: Conjunctivae and EOM are normal. Pupils are equal, round, and reactive to light.  Neck: Normal range of motion. Neck supple.  Cardiovascular: Normal rate and regular rhythm.   Pulmonary/Chest: Effort normal and breath sounds normal.  Abdominal: Soft. Bowel sounds are normal. He exhibits no distension. There  is no tenderness. There is no rebound and no guarding.  Neurological: He is alert and oriented to person, place, and time. No cranial nerve deficit. Coordination normal.  No nystagmus. Cerebellar exam is normal (finger to nose) Sensory exam normal for bilateral upper and lower extremities - and patient is able to discriminate between sharp and dull. Motor exam is 4+/5   Skin: Skin is warm.  Nursing note and vitals reviewed.    ED Treatments / Results  Labs (all labs ordered are listed, but only abnormal results are displayed) Labs Reviewed  BASIC METABOLIC PANEL - Abnormal; Notable for the following:       Result Value   Glucose, Bld 107 (*)    All other components within normal limits  CBC  MAGNESIUM  I-STAT TROPOININ, ED    EKG  EKG Interpretation  Date/Time:  Wednesday December 10 2016 16:00:09 EST Ventricular Rate:  87 PR Interval:  156 QRS Duration: 80 QT Interval:  356 QTC Calculation: 428 R Axis:   71 Text Interpretation:  Normal sinus rhythm with sinus arrhythmia Right atrial enlargement Anterior infarct , age undetermined Abnormal ECG unchanged Confirmed by Kathrynn Humble, MD, Thelma Comp [phone]) on 12/10/2016 4:23:28 PM       Radiology Dg Chest 2 View  Result Date: 12/10/2016 CLINICAL DATA:  Dizziness since waking up this morning. EXAM: CHEST  2 VIEW COMPARISON:  CT chest 07/20/2011 FINDINGS: The heart size and mediastinal contours are within normal limits. Both lungs are clear. The visualized skeletal structures are unremarkable. IMPRESSION: No active cardiopulmonary disease. Electronically Signed   By: Kathreen Devoid   On: 12/10/2016 17:07    Procedures Procedures (including critical care time)  Medications Ordered in ED Medications  sodium chloride 0.9 % bolus 1,000 mL (0 mLs Intravenous Stopped 12/10/16 1858)  meclizine (ANTIVERT) tablet 25 mg (25 mg Oral Given 12/10/16 1721)     Initial Impression / Assessment and Plan / ED Course  I have reviewed the triage  vital signs and the nursing notes.  Pertinent labs & imaging results that were available during my care of the patient were reviewed by me and considered in my medical decision making (see chart for details).    Pt comes in with cc of spinning sensation. Admits to heavy drinking./ Non focal neuro exam and no hard risk factors for stroke. Pt hydrated and reassessed and he reported that his symptoms had resolved completely. Pt ambulated w/o any problems, and we discharged. Strict ER return precautions have been discussed, and patient is agreeing with the plan and is comfortable with the workup done and the recommendations from the ER.   Final Clinical Impressions(s) / ED Diagnoses   Final diagnoses:  Dehydration    New Prescriptions Discharge Medication List as of 12/10/2016  7:12 PM       Itzel Mckibbin  Kathrynn Humble, MD 12/11/16 [phone]

## 2016-12-10 NOTE — ED Notes (Signed)
Pt ambulated without difficulty

## 2016-12-10 NOTE — ED Notes (Addendum)
Pt given gingerale and water for fluid challenge. Pt tolerating well. Will continue to monitor.

## 2016-12-10 NOTE — ED Notes (Signed)
E-signature not working. Pt states that he understands d/c instructions.

## 2016-12-10 NOTE — ED Provider Notes (Signed)
[facility] DEPT Provider Note   CSN: [id number] Arrival date & time: 12/10/16  1948  By signing my name below, I, Ephriam Jenkins, attest that this documentation has been prepared under the direction and in the presence of Ramonte Mena PA-C.  Electronically Signed: Ephriam Jenkins, ED Scribe. 12/10/16. 9:19 PM.  History   Chief Complaint Chief Complaint  Patient presents with  . Dizziness   HPI HPI Comments: Arthur Beasley is a 36 y.o. male who presents to the Emergency Department complaining of chills, nausea, diarrhea, nonproductive cough, and lightheadedness that started this morning. He notes one episode of diarrhea today. Endorses poor hydration. Has not taken any medications for his symptoms. Denies vomiting, chest pain, shortness of breath, abdominal pain, or any other complaints. Denies influenza immunization.  Patient had just been evaluated in the ED shortly before he checked back in with the exact same complaint.    The history is provided by the patient. No language interpreter was used.   Past Medical History:  Diagnosis Date  . Auditory hallucinations   . Bipolar disorder ([place])   . Depression   . ETOH abuse   . Hypertension   . Retained orthopedic hardware    mandible; unable to open mouth wide    Patient Active Problem List   Diagnosis Date Noted  . Bipolar disorder, unspecified 12/05/2013  . Dysuria 12/05/2013  . 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 Abscess, jaw 12/04/2013    Past Surgical History:  Procedure Laterality Date  . MANDIBULAR HARDWARE REMOVAL  09/27/2012   Procedure: MANDIBULAR HARDWARE REMOVAL;  Surgeon: Ascencion Dike, MD;  Location: [facility];  Service: ENT;  Laterality: N/A;  REMOVAL OF MMF HARDWARE  . MANDIBULAR HARDWARE REMOVAL N/A 12/05/2013   Procedure: MANDIBULAR HARDWARE REMOVAL Irrigation and  dedridement;  Surgeon: Ascencion Dike, MD;  Location: [facility];  Service: ENT;  Laterality:  N/A;  . ORIF MANDIBULAR FRACTURE  08/18/2012   Procedure: OPEN REDUCTION INTERNAL FIXATION (ORIF) MANDIBULAR FRACTURE;  Surgeon: Ascencion Dike, MD;  Location: [facility];  Service: ENT;  Laterality: N/A;     Home Medications    Prior to Admission medications   Medication Sig Start Date End Date Taking? Authorizing Provider  amLODipine (NORVASC) 5 MG tablet Take 1 tablet (5 mg total) by mouth daily. Patient not taking: Reported on 12/10/2016 09/04/16   Robyn Haber, MD  benzonatate (TESSALON) 100 MG capsule Take 1 capsule (100 mg total) by mouth every 8 (eight) hours. 12/10/16   Spence Soberano C Camille Dragan, PA-C  ibuprofen (ADVIL,MOTRIN) 800 MG tablet Take 1 tablet (800 mg total) by mouth 3 (three) times daily. 12/10/16   [facility] C Drina Jobst, PA-C  levofloxacin (LEVAQUIN) 500 MG tablet Take 1 tablet (500 mg total) by mouth daily. Patient not taking: Reported on 12/10/2016 09/04/16   Robyn Haber, MD  ondansetron (ZOFRAN ODT) 4 MG disintegrating tablet Take 1 tablet (4 mg total) by mouth every 8 (eight) hours as needed for nausea or vomiting. 12/10/16   Lorayne Bender, PA-C  oxyCODONE (OXYCONTIN) 10 mg 12 hr tablet Take 10 mg by mouth daily as needed. For pain    Historical Provider, MD  predniSONE (DELTASONE) 20 MG tablet Two daily with food Patient not taking: Reported on 12/10/2016 09/04/16   Robyn Haber, MD    Family History No family history on file.  Social History Social History  Substance Use Topics  . Smoking status: Current Every Day Smoker  Packs/day: 1.00    Types: Cigarettes  . Smokeless tobacco: Never Used  . Alcohol use No   Allergies   Penicillins  Review of Systems Review of Systems  Constitutional: Positive for chills.  HENT: Negative for sore throat.   Respiratory: Positive for cough. Negative for shortness of breath.   Cardiovascular: Negative for chest pain.  Gastrointestinal: Positive for diarrhea and nausea. Negative for abdominal pain and vomiting.  Neurological: Positive for  light-headedness.  All other systems reviewed and are negative.   Physical Exam Updated Vital Signs BP (!) 153/108 (BP Location: Left Arm)   Pulse 86   Resp 16   SpO2 100%   Physical Exam  Constitutional: He is oriented to person, place, and time. He appears well-developed and well-nourished. No distress.  HENT:  Head: Normocephalic and atraumatic.  Mouth/Throat: Oropharynx is clear and moist.  Eyes: Conjunctivae and EOM are normal. Pupils are equal, round, and reactive to light.  Neck: Normal range of motion. Neck supple.  Cardiovascular: Normal rate, regular rhythm, normal heart sounds and intact distal pulses.   Pulmonary/Chest: Effort normal and breath sounds normal. No respiratory distress.  Abdominal: Soft. He exhibits no distension. There is no tenderness. There is no guarding.  Musculoskeletal: He exhibits no edema.  Lymphadenopathy:    He has no cervical adenopathy.  Neurological: He is alert and oriented to person, place, and time.  No sensory deficits. Strength 5/5 in all extremities. No gait disturbance. Coordination intact including heel to shin and finger to nose. Cranial nerves III-XII grossly intact. No facial droop.   Skin: Skin is warm and dry. No rash noted. He is not diaphoretic. No erythema. No pallor.  Psychiatric: He has a normal mood and affect. His behavior is normal.  Nursing note and vitals reviewed.   ED Treatments / Results  DIAGNOSTIC STUDIES: Oxygen Saturation is 100% on RA, normal by my interpretation.  COORDINATION OF CARE: 9:15 PM-Discussed treatment plan with pt at bedside and pt agreed to plan.   Labs (all labs ordered are listed, but only abnormal results are displayed) Labs Reviewed - No data to display  EKG  EKG Interpretation None      Radiology Dg Chest 2 View  Result Date: 12/10/2016 CLINICAL DATA:  Dizziness since waking up this morning. EXAM: CHEST  2 VIEW COMPARISON:  CT chest 07/20/2011 FINDINGS: The heart size and  mediastinal contours are within normal limits. Both lungs are clear. The visualized skeletal structures are unremarkable. IMPRESSION: No active cardiopulmonary disease. Electronically Signed   By: Kathreen Devoid   On: 12/10/2016 17:07   Procedures Procedures (including critical care time)  Medications Ordered in ED Medications  ondansetron (ZOFRAN-ODT) disintegrating tablet 4 mg (4 mg Oral Given 12/10/16 2125)     Initial Impression / Assessment and Plan / ED Course  I have reviewed the triage vital signs and the nursing notes.  Pertinent labs & imaging results that were available during my care of the patient were reviewed by me and considered in my medical decision making (see chart for details).      Patient presents with symptoms consistent with viral illness combined with some minor dehydration. No signs of severe illness. Patient has no neuro or functional deficits. Symptomatic care and return precautions discussed. Patient voiced understanding of all instructions and is comfortable with discharge.  Vitals:   12/10/16 1956 12/10/16 2143  BP: (!) 153/108 (!) 162/106  Pulse: 86 82  Resp: 16 16  SpO2: 100% 100%  Final Clinical Impressions(s) / ED Diagnoses   Final diagnoses:  Influenza-like illness    New Prescriptions Discharge Medication List as of 12/10/2016  9:37 PM    START taking these medications   Details  benzonatate (TESSALON) 100 MG capsule Take 1 capsule (100 mg total) by mouth every 8 (eight) hours., Starting Wed 12/10/2016, Print    ibuprofen (ADVIL,MOTRIN) 800 MG tablet Take 1 tablet (800 mg total) by mouth 3 (three) times daily., Starting Wed 12/10/2016, Print    ondansetron (ZOFRAN ODT) 4 MG disintegrating tablet Take 1 tablet (4 mg total) by mouth every 8 (eight) hours as needed for nausea or vomiting., Starting Wed 12/10/2016, Print       I personally performed the services described in this documentation, which was scribed in my presence. The  recorded information has been reviewed and is accurate.    Lorayne Bender, PA-C 12/12/16 Christoval Liu, MD 12/16/16 [phone]

## 2017-01-27 ENCOUNTER — Emergency Department (HOSPITAL_COMMUNITY): Payer: Medicaid Other

## 2017-01-27 ENCOUNTER — Inpatient Hospital Stay (HOSPITAL_COMMUNITY)
Admission: EM | Admit: 2017-01-27 | Discharge: 2017-02-19 | DRG: 003 | Disposition: A | Discharge status: Rehabilitation Facility | Payer: Medicaid Other | Attending: General Surgery | Admitting: General Surgery

## 2017-01-27 DIAGNOSIS — E8809 Other disorders of plasma-protein metabolism, not elsewhere classified: Secondary | ICD-10-CM

## 2017-01-27 DIAGNOSIS — J9601 Acute respiratory failure with hypoxia: Secondary | ICD-10-CM | POA: Diagnosis present

## 2017-01-27 DIAGNOSIS — D649 Anemia, unspecified: Secondary | ICD-10-CM | POA: Diagnosis not present

## 2017-01-27 DIAGNOSIS — I7771 Dissection of carotid artery: Secondary | ICD-10-CM

## 2017-01-27 DIAGNOSIS — E870 Hyperosmolality and hypernatremia: Secondary | ICD-10-CM | POA: Diagnosis not present

## 2017-01-27 DIAGNOSIS — I69391 Dysphagia following cerebral infarction: Secondary | ICD-10-CM | POA: Diagnosis not present

## 2017-01-27 DIAGNOSIS — D72829 Elevated white blood cell count, unspecified: Secondary | ICD-10-CM | POA: Diagnosis not present

## 2017-01-27 DIAGNOSIS — Z9911 Dependence on respirator [ventilator] status: Secondary | ICD-10-CM | POA: Diagnosis not present

## 2017-01-27 DIAGNOSIS — I69320 Aphasia following cerebral infarction: Secondary | ICD-10-CM | POA: Diagnosis not present

## 2017-01-27 DIAGNOSIS — S01442A Puncture wound with foreign body of left cheek and temporomandibular area, initial encounter: Principal | ICD-10-CM | POA: Diagnosis present

## 2017-01-27 DIAGNOSIS — N390 Urinary tract infection, site not specified: Secondary | ICD-10-CM | POA: Diagnosis not present

## 2017-01-27 DIAGNOSIS — Z01818 Encounter for other preprocedural examination: Secondary | ICD-10-CM

## 2017-01-27 DIAGNOSIS — R739 Hyperglycemia, unspecified: Secondary | ICD-10-CM | POA: Diagnosis not present

## 2017-01-27 DIAGNOSIS — J9690 Respiratory failure, unspecified, unspecified whether with hypoxia or hypercapnia: Secondary | ICD-10-CM

## 2017-01-27 DIAGNOSIS — J9600 Acute respiratory failure, unspecified whether with hypoxia or hypercapnia: Secondary | ICD-10-CM | POA: Diagnosis not present

## 2017-01-27 DIAGNOSIS — Z930 Tracheostomy status: Secondary | ICD-10-CM | POA: Diagnosis not present

## 2017-01-27 DIAGNOSIS — I6522 Occlusion and stenosis of left carotid artery: Secondary | ICD-10-CM | POA: Diagnosis present

## 2017-01-27 DIAGNOSIS — R414 Neurologic neglect syndrome: Secondary | ICD-10-CM | POA: Diagnosis not present

## 2017-01-27 DIAGNOSIS — D62 Acute posthemorrhagic anemia: Secondary | ICD-10-CM | POA: Diagnosis not present

## 2017-01-27 DIAGNOSIS — I63412 Cerebral infarction due to embolism of left middle cerebral artery: Secondary | ICD-10-CM | POA: Diagnosis not present

## 2017-01-27 DIAGNOSIS — I6789 Other cerebrovascular disease: Secondary | ICD-10-CM | POA: Diagnosis not present

## 2017-01-27 DIAGNOSIS — I10 Essential (primary) hypertension: Secondary | ICD-10-CM | POA: Diagnosis present

## 2017-01-27 DIAGNOSIS — S02609D Fracture of mandible, unspecified, subsequent encounter for fracture with routine healing: Secondary | ICD-10-CM | POA: Diagnosis not present

## 2017-01-27 DIAGNOSIS — E878 Other disorders of electrolyte and fluid balance, not elsewhere classified: Secondary | ICD-10-CM | POA: Diagnosis present

## 2017-01-27 DIAGNOSIS — G8193 Hemiplegia, unspecified affecting right nondominant side: Secondary | ICD-10-CM | POA: Diagnosis not present

## 2017-01-27 DIAGNOSIS — D696 Thrombocytopenia, unspecified: Secondary | ICD-10-CM | POA: Diagnosis present

## 2017-01-27 DIAGNOSIS — J9501 Hemorrhage from tracheostomy stoma: Secondary | ICD-10-CM | POA: Diagnosis not present

## 2017-01-27 DIAGNOSIS — Z79899 Other long term (current) drug therapy: Secondary | ICD-10-CM | POA: Diagnosis not present

## 2017-01-27 DIAGNOSIS — Q251 Coarctation of aorta: Secondary | ICD-10-CM

## 2017-01-27 DIAGNOSIS — I639 Cerebral infarction, unspecified: Secondary | ICD-10-CM

## 2017-01-27 DIAGNOSIS — I63132 Cerebral infarction due to embolism of left carotid artery: Secondary | ICD-10-CM | POA: Diagnosis not present

## 2017-01-27 DIAGNOSIS — H5702 Anisocoria: Secondary | ICD-10-CM | POA: Diagnosis not present

## 2017-01-27 DIAGNOSIS — Z4659 Encounter for fitting and adjustment of other gastrointestinal appliance and device: Secondary | ICD-10-CM

## 2017-01-27 DIAGNOSIS — Z9889 Other specified postprocedural states: Secondary | ICD-10-CM | POA: Diagnosis not present

## 2017-01-27 DIAGNOSIS — E876 Hypokalemia: Secondary | ICD-10-CM | POA: Diagnosis present

## 2017-01-27 DIAGNOSIS — I69351 Hemiplegia and hemiparesis following cerebral infarction affecting right dominant side: Secondary | ICD-10-CM | POA: Diagnosis not present

## 2017-01-27 DIAGNOSIS — I69390 Apraxia following cerebral infarction: Secondary | ICD-10-CM | POA: Diagnosis not present

## 2017-01-27 DIAGNOSIS — S299XXA Unspecified injury of thorax, initial encounter: Secondary | ICD-10-CM

## 2017-01-27 DIAGNOSIS — R4701 Aphasia: Secondary | ICD-10-CM | POA: Diagnosis not present

## 2017-01-27 DIAGNOSIS — W3400XA Accidental discharge from unspecified firearms or gun, initial encounter: Secondary | ICD-10-CM

## 2017-01-27 DIAGNOSIS — S069X9S Unspecified intracranial injury with loss of consciousness of unspecified duration, sequela: Secondary | ICD-10-CM | POA: Diagnosis not present

## 2017-01-27 DIAGNOSIS — R509 Fever, unspecified: Secondary | ICD-10-CM | POA: Diagnosis not present

## 2017-01-27 DIAGNOSIS — G63 Polyneuropathy in diseases classified elsewhere: Secondary | ICD-10-CM

## 2017-01-27 DIAGNOSIS — D6489 Other specified anemias: Secondary | ICD-10-CM | POA: Diagnosis present

## 2017-01-27 DIAGNOSIS — I6340 Cerebral infarction due to embolism of unspecified cerebral artery: Secondary | ICD-10-CM | POA: Insufficient documentation

## 2017-01-27 DIAGNOSIS — S15092A Other specified injury of left carotid artery, initial encounter: Secondary | ICD-10-CM

## 2017-01-27 DIAGNOSIS — G8191 Hemiplegia, unspecified affecting right dominant side: Secondary | ICD-10-CM | POA: Diagnosis not present

## 2017-01-27 DIAGNOSIS — G935 Compression of brain: Secondary | ICD-10-CM | POA: Diagnosis not present

## 2017-01-27 DIAGNOSIS — I63512 Cerebral infarction due to unspecified occlusion or stenosis of left middle cerebral artery: Secondary | ICD-10-CM | POA: Diagnosis not present

## 2017-01-27 DIAGNOSIS — D473 Essential (hemorrhagic) thrombocythemia: Secondary | ICD-10-CM | POA: Diagnosis not present

## 2017-01-27 DIAGNOSIS — S02602A Fracture of unspecified part of body of left mandible, initial encounter for closed fracture: Secondary | ICD-10-CM

## 2017-01-27 DIAGNOSIS — I63232 Cerebral infarction due to unspecified occlusion or stenosis of left carotid arteries: Secondary | ICD-10-CM | POA: Diagnosis not present

## 2017-01-27 DIAGNOSIS — R29725 NIHSS score 25: Secondary | ICD-10-CM | POA: Diagnosis not present

## 2017-01-27 DIAGNOSIS — S069X9A Unspecified intracranial injury with loss of consciousness of unspecified duration, initial encounter: Secondary | ICD-10-CM | POA: Diagnosis not present

## 2017-01-27 DIAGNOSIS — Z931 Gastrostomy status: Secondary | ICD-10-CM | POA: Diagnosis not present

## 2017-01-27 DIAGNOSIS — R609 Edema, unspecified: Secondary | ICD-10-CM | POA: Diagnosis not present

## 2017-01-27 DIAGNOSIS — G936 Cerebral edema: Secondary | ICD-10-CM | POA: Diagnosis not present

## 2017-01-27 DIAGNOSIS — R1310 Dysphagia, unspecified: Secondary | ICD-10-CM | POA: Diagnosis not present

## 2017-01-27 DIAGNOSIS — B9620 Unspecified Escherichia coli [E. coli] as the cause of diseases classified elsewhere: Secondary | ICD-10-CM | POA: Diagnosis not present

## 2017-01-27 DIAGNOSIS — G939 Disorder of brain, unspecified: Secondary | ICD-10-CM | POA: Diagnosis not present

## 2017-01-27 DIAGNOSIS — I69319 Unspecified symptoms and signs involving cognitive functions following cerebral infarction: Secondary | ICD-10-CM | POA: Diagnosis not present

## 2017-01-27 LAB — PREPARE FRESH FROZEN PLASMA
UNIT DIVISION: 0
Unit division: 0

## 2017-01-27 LAB — COMPREHENSIVE METABOLIC PANEL
ALBUMIN: 4.1 g/dL (ref 3.5–5.0)
ALT: 22 U/L (ref 17–63)
AST: 27 U/L (ref 15–41)
Alkaline Phosphatase: 66 U/L (ref 38–126)
Anion gap: 10 (ref 5–15)
BILIRUBIN TOTAL: 0.5 mg/dL (ref 0.3–1.2)
BUN: <5 mg/dL — ABNORMAL LOW (ref 6–20)
CO2: 25 mmol/L (ref 22–32)
Calcium: 8.7 mg/dL — ABNORMAL LOW (ref 8.9–10.3)
Chloride: 104 mmol/L (ref 101–111)
Creatinine, Ser: 0.92 mg/dL (ref 0.61–1.24)
GFR calc Af Amer: >60 mL/min (ref >60)
GFR calc non Af Amer: >60 mL/min (ref >60)
Glucose, Bld: 122 mg/dL — ABNORMAL HIGH (ref 65–99)
POTASSIUM: 3.4 mmol/L — AB (ref 3.5–5.1)
SODIUM: 139 mmol/L (ref 135–145)
Total Protein: 6.6 g/dL (ref 6.5–8.1)

## 2017-01-27 LAB — BPAM FFP
BLOOD PRODUCT EXPIRATION DATE: 201804032359
Blood Product Expiration Date: 201804022359
ISSUE DATE / TIME: 201803131802
ISSUE DATE / TIME: 201803131802
Unit Type and Rh: 600
Unit Type and Rh: 600

## 2017-01-27 LAB — PROTIME-INR
INR: 1.1
PROTHROMBIN TIME: 14.2 s (ref 11.4–15.2)

## 2017-01-27 LAB — CBC
HCT: 40.1 % (ref 39.0–52.0)
Hemoglobin: 13.2 g/dL (ref 13.0–17.0)
MCH: 29.4 pg (ref 26.0–34.0)
MCHC: 32.9 g/dL (ref 30.0–36.0)
MCV: 89.3 fL (ref 78.0–100.0)
Platelets: 195 10*3/uL (ref 150–400)
RBC: 4.49 MIL/uL (ref 4.22–5.81)
RDW: 13.3 % (ref 11.5–15.5)
WBC: 12.2 10*3/uL — ABNORMAL HIGH (ref 4.0–10.5)

## 2017-01-27 LAB — CDS SEROLOGY

## 2017-01-27 LAB — ETHANOL: Alcohol, Ethyl (B): 192 mg/dL — ABNORMAL HIGH (ref <5)

## 2017-01-27 MED ORDER — IOPAMIDOL (ISOVUE-370) INJECTION 76%
INTRAVENOUS | Status: AC
  Filled 2017-01-27: qty 50

## 2017-01-27 MED ORDER — IOPAMIDOL (ISOVUE-370) INJECTION 76%
INTRAVENOUS | Status: AC
  Administered 2017-01-27: 75 mL
  Filled 2017-01-27: qty 100

## 2017-01-27 MED ORDER — LIDOCAINE 4 % EX CREA
TOPICAL_CREAM | Freq: Once | CUTANEOUS | Status: DC
  Filled 2017-01-27: qty 5

## 2017-01-27 MED ORDER — LIDOCAINE VISCOUS 2 % MT SOLN: 15.0000 mL | Freq: Once | OROMUCOSAL | Status: DC

## 2017-01-27 MED ORDER — HYDROMORPHONE HCL 2 MG/ML IJ SOLN
0.5000 mg | INTRAMUSCULAR | Status: DC | PRN
  Administered 2017-01-27: 1 mg via INTRAVENOUS
  Filled 2017-01-27: qty 1

## 2017-01-27 MED ORDER — ONDANSETRON HCL 4 MG/2ML IJ SOLN: 4.0000 mg | Freq: Four times a day (QID) | INTRAMUSCULAR | Status: DC | PRN

## 2017-01-27 MED ORDER — ONDANSETRON HCL 4 MG/2ML IJ SOLN
4.0000 mg | Freq: Once | INTRAMUSCULAR | Status: AC
  Administered 2017-01-27: 4 mg via INTRAVENOUS
  Filled 2017-01-27: qty 2

## 2017-01-27 MED ORDER — CLINDAMYCIN PHOSPHATE 300 MG/50ML IV SOLN
300.0000 mg | Freq: Four times a day (QID) | INTRAVENOUS | Status: DC
  Administered 2017-01-27 – 2017-02-05 (×33): 300 mg via INTRAVENOUS
  Filled 2017-01-27 (×39): qty 50

## 2017-01-27 MED ORDER — MORPHINE SULFATE (PF) 4 MG/ML IV SOLN
2.0000 mg | Freq: Once | INTRAVENOUS | Status: AC
  Administered 2017-01-27: 2 mg via INTRAVENOUS
  Filled 2017-01-27: qty 1

## 2017-01-27 MED ORDER — DEXTROSE-NACL 5-0.9 % IV SOLN
INTRAVENOUS | Status: DC
  Administered 2017-01-27 – 2017-01-28 (×3): via INTRAVENOUS
  Filled 2017-01-27: qty 1000

## 2017-01-27 MED ORDER — LIDOCAINE HCL (PF) 1 % IJ SOLN: 10.0000 mL | Freq: Once | INTRAMUSCULAR | Status: DC

## 2017-01-27 MED ORDER — ONDANSETRON HCL 4 MG PO TABS: 4.0000 mg | ORAL_TABLET | Freq: Four times a day (QID) | ORAL | Status: DC | PRN

## 2017-01-27 MED ORDER — LIDOCAINE HCL (PF) 4 % IJ SOLN
5.0000 mL | Freq: Once | INTRAMUSCULAR | Status: DC
  Filled 2017-01-27: qty 5

## 2017-01-27 NOTE — ED Notes (Signed)
Transporting patient to CT

## 2017-01-27 NOTE — ED Notes (Signed)
C-collar placed on patient, pt logrolled

## 2017-01-27 NOTE — ED Notes (Signed)
This RN remained with pt during CT and transported pt back from Ct.
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
## 2017-01-27 NOTE — ED Notes (Signed)
Family at bedside. 
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
## 2017-01-27 NOTE — ED Notes (Signed)
Patient has family coming back 2 x 2. Pt states he wants to still be confidential. Registration and security notified patient that he may only have immediate family back. Family and patient both aware of this.
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
## 2017-01-27 NOTE — ED Notes (Signed)
Patient placed on 2L O2 [place] d/t SpO2 dropping to 89% RA following dilaudid administration

## 2017-01-28 ENCOUNTER — Inpatient Hospital Stay (HOSPITAL_COMMUNITY): Payer: Medicaid Other | Admitting: Certified Registered Nurse Anesthetist

## 2017-01-28 ENCOUNTER — Inpatient Hospital Stay (HOSPITAL_COMMUNITY): Payer: Medicaid Other

## 2017-01-28 ENCOUNTER — Encounter (HOSPITAL_COMMUNITY): Admission: EM | Disposition: A | Discharge status: Rehabilitation Facility | Payer: Self-pay | Source: Home / Self Care

## 2017-01-28 DIAGNOSIS — I6340 Cerebral infarction due to embolism of unspecified cerebral artery: Secondary | ICD-10-CM | POA: Insufficient documentation

## 2017-01-28 DIAGNOSIS — I6522 Occlusion and stenosis of left carotid artery: Secondary | ICD-10-CM

## 2017-01-28 DIAGNOSIS — D649 Anemia, unspecified: Secondary | ICD-10-CM

## 2017-01-28 DIAGNOSIS — G935 Compression of brain: Secondary | ICD-10-CM

## 2017-01-28 DIAGNOSIS — I63412 Cerebral infarction due to embolism of left middle cerebral artery: Secondary | ICD-10-CM

## 2017-01-28 HISTORY — PX: RADIOLOGY WITH ANESTHESIA: SHX6223

## 2017-01-28 HISTORY — PX: IR GENERIC HISTORICAL: IMG1180011

## 2017-01-28 LAB — POCT I-STAT 7, (LYTES, BLD GAS, ICA,H+H)
ACID-BASE DEFICIT: 3 mmol/L — AB (ref 0.0–2.0)
BICARBONATE: 22.7 mmol/L (ref 20.0–28.0)
CALCIUM ION: 1.04 mmol/L — AB (ref 1.15–1.40)
HCT: 30 % — ABNORMAL LOW (ref 39.0–52.0)
Hemoglobin: 10.2 g/dL — ABNORMAL LOW (ref 13.0–17.0)
O2 Saturation: 100 %
PH ART: 7.337 — AB (ref 7.350–7.450)
Potassium: 4.1 mmol/L (ref 3.5–5.1)
SODIUM: 139 mmol/L (ref 135–145)
TCO2: 24 mmol/L (ref 0–100)
pCO2 arterial: 42.3 mmHg (ref 32.0–48.0)
pO2, Arterial: 202 mmHg — ABNORMAL HIGH (ref 83.0–108.0)

## 2017-01-28 LAB — BLOOD GAS, ARTERIAL
Acid-base deficit: 6.1 mmol/L — ABNORMAL HIGH (ref 0.0–2.0)
Bicarbonate: 18.8 mmol/L — ABNORMAL LOW (ref 20.0–28.0)
DRAWN BY: 33100
FIO2: 100
MECHVT: 530 mL
O2 Saturation: 99.6 %
PATIENT TEMPERATURE: 97.7
PCO2 ART: 36.1 mmHg (ref 32.0–48.0)
PEEP/CPAP: 5 cmH2O
PO2 ART: 379 mmHg — AB (ref 83.0–108.0)
RATE: 14 resp/min
pH, Arterial: 7.334 — ABNORMAL LOW (ref 7.350–7.450)

## 2017-01-28 LAB — CBC
HEMATOCRIT: 40.1 % (ref 39.0–52.0)
Hemoglobin: 13.2 g/dL (ref 13.0–17.0)
MCH: 29.3 pg (ref 26.0–34.0)
MCHC: 32.9 g/dL (ref 30.0–36.0)
MCV: 88.9 fL (ref 78.0–100.0)
PLATELETS: 214 10*3/uL (ref 150–400)
RBC: 4.51 MIL/uL (ref 4.22–5.81)
RDW: 13.4 % (ref 11.5–15.5)
WBC: 12 10*3/uL — AB (ref 4.0–10.5)

## 2017-01-28 LAB — BASIC METABOLIC PANEL
ANION GAP: 13 (ref 5–15)
BUN: <5 mg/dL — ABNORMAL LOW (ref 6–20)
CALCIUM: 8.7 mg/dL — AB (ref 8.9–10.3)
CO2: 22 mmol/L (ref 22–32)
CREATININE: 0.74 mg/dL (ref 0.61–1.24)
Chloride: 102 mmol/L (ref 101–111)
GFR calc Af Amer: >60 mL/min (ref >60)
GLUCOSE: 140 mg/dL — AB (ref 65–99)
Potassium: 3.9 mmol/L (ref 3.5–5.1)
Sodium: 137 mmol/L (ref 135–145)

## 2017-01-28 LAB — BLOOD PRODUCT ORDER (VERBAL) VERIFICATION

## 2017-01-28 LAB — ABO/RH: ABO/RH(D): O POS

## 2017-01-28 LAB — SODIUM
SODIUM: 145 mmol/L (ref 135–145)
Sodium: 140 mmol/L (ref 135–145)

## 2017-01-28 LAB — HIV ANTIBODY (ROUTINE TESTING W REFLEX): HIV Screen 4th Generation wRfx: NONREACTIVE

## 2017-01-28 LAB — MRSA PCR SCREENING: MRSA by PCR: NEGATIVE

## 2017-01-28 LAB — POCT ACTIVATED CLOTTING TIME: ACTIVATED CLOTTING TIME: 175 s

## 2017-01-28 SURGERY — RADIOLOGY WITH ANESTHESIA
Anesthesia: Choice

## 2017-01-28 MED ORDER — PROPOFOL 500 MG/50ML IV EMUL
INTRAVENOUS | Status: DC | PRN
  Administered 2017-01-28: 50 ug/kg/min via INTRAVENOUS

## 2017-01-28 MED ORDER — ASPIRIN 325 MG PO TABS
ORAL_TABLET | ORAL | Status: AC
  Filled 2017-01-28: qty 1

## 2017-01-28 MED ORDER — LIDOCAINE 2% (20 MG/ML) 5 ML SYRINGE
INTRAMUSCULAR | Status: DC | PRN
  Administered 2017-01-28: 100 mg via INTRAVENOUS

## 2017-01-28 MED ORDER — IOPAMIDOL (ISOVUE-300) INJECTION 61%
INTRAVENOUS | Status: AC
  Administered 2017-01-28: 100 mL
  Filled 2017-01-28: qty 150

## 2017-01-28 MED ORDER — GLYCOPYRROLATE 0.2 MG/ML IV SOSY
PREFILLED_SYRINGE | INTRAVENOUS | Status: DC | PRN
  Administered 2017-01-28: 0.4 mg via INTRAVENOUS

## 2017-01-28 MED ORDER — ACETAMINOPHEN 160 MG/5ML PO SOLN
650.0000 mg | ORAL | Status: DC | PRN
  Administered 2017-01-30 – 2017-02-15 (×6): 650 mg
  Filled 2017-01-28 (×6): qty 20.3

## 2017-01-28 MED ORDER — HYDROMORPHONE HCL 1 MG/ML IJ SOLN
0.5000 mg | INTRAMUSCULAR | Status: DC | PRN
  Administered 2017-01-28 – 2017-02-04 (×3): 1 mg via INTRAVENOUS
  Filled 2017-01-28 (×4): qty 1

## 2017-01-28 MED ORDER — CLOPIDOGREL BISULFATE 75 MG PO TABS
75.0000 mg | ORAL_TABLET | Freq: Every day | ORAL | Status: DC
  Filled 2017-01-28: qty 1

## 2017-01-28 MED ORDER — ASPIRIN 325 MG PO TABS
ORAL_TABLET | ORAL | Status: AC | PRN
  Administered 2017-01-28: 325 mg

## 2017-01-28 MED ORDER — CHLORHEXIDINE GLUCONATE 0.12% ORAL RINSE (MEDLINE KIT)
15.0000 mL | Freq: Two times a day (BID) | OROMUCOSAL | Status: DC
  Administered 2017-01-28 – 2017-02-16 (×39): 15 mL via OROMUCOSAL

## 2017-01-28 MED ORDER — SODIUM CHLORIDE 3 % IV SOLN
INTRAVENOUS | Status: DC
  Filled 2017-01-28 (×2): qty 500

## 2017-01-28 MED ORDER — ACETAMINOPHEN 325 MG PO TABS
650.0000 mg | ORAL_TABLET | ORAL | Status: DC | PRN
  Administered 2017-02-13 – 2017-02-16 (×2): 650 mg via ORAL
  Filled 2017-01-28 (×2): qty 2

## 2017-01-28 MED ORDER — ORAL CARE MOUTH RINSE
15.0000 mL | OROMUCOSAL | Status: DC
  Administered 2017-01-28 – 2017-02-16 (×180): 15 mL via OROMUCOSAL

## 2017-01-28 MED ORDER — DEXTROSE 5 % IV SOLN
INTRAVENOUS | Status: DC | PRN
  Administered 2017-01-28: 10:00:00 via INTRAVENOUS
  Administered 2017-01-28: 40 ug/min via INTRAVENOUS

## 2017-01-28 MED ORDER — MIDAZOLAM HCL 2 MG/2ML IJ SOLN: 2.0000 mg | INTRAMUSCULAR | Status: DC | PRN

## 2017-01-28 MED ORDER — ASPIRIN 325 MG PO TABS
325.0000 mg | ORAL_TABLET | Freq: Every day | ORAL | Status: DC
  Administered 2017-01-30 – 2017-02-06 (×8): 325 mg via ORAL
  Filled 2017-01-28 (×10): qty 1

## 2017-01-28 MED ORDER — FENTANYL CITRATE (PF) 100 MCG/2ML IJ SOLN
50.0000 ug | Freq: Once | INTRAMUSCULAR | Status: AC
  Administered 2017-01-28: 50 ug via INTRAVENOUS
  Filled 2017-01-28: qty 2

## 2017-01-28 MED ORDER — SODIUM CHLORIDE 3 % CONCENTRATED NICU IV INFUSION: 75.0000 mL/h | INTRAVENOUS | Status: DC

## 2017-01-28 MED ORDER — CLOPIDOGREL BISULFATE 300 MG PO TABS
ORAL_TABLET | ORAL | Status: AC
  Filled 2017-01-28: qty 1

## 2017-01-28 MED ORDER — HEPARIN SODIUM (PORCINE) 1000 UNIT/ML IJ SOLN
INTRAMUSCULAR | Status: DC | PRN
  Administered 2017-01-28: 1000 [IU] via INTRAVENOUS

## 2017-01-28 MED ORDER — DEXMEDETOMIDINE HCL IN NACL 200 MCG/50ML IV SOLN
0.0000 ug/kg/h | INTRAVENOUS | Status: AC
  Administered 2017-01-28: 0.4 ug/kg/h via INTRAVENOUS
  Administered 2017-01-28: 0.5 ug/kg/h via INTRAVENOUS
  Administered 2017-01-29 (×2): 0.7 ug/kg/h via INTRAVENOUS
  Administered 2017-01-30: 0.6 ug/kg/h via INTRAVENOUS
  Administered 2017-01-30: 0.5 ug/kg/h via INTRAVENOUS
  Administered 2017-01-30: 0.6 ug/kg/h via INTRAVENOUS
  Administered 2017-01-30 – 2017-01-31 (×2): 0.5 ug/kg/h via INTRAVENOUS
  Filled 2017-01-28 (×10): qty 50

## 2017-01-28 MED ORDER — ACETAMINOPHEN 650 MG RE SUPP
650.0000 mg | RECTAL | Status: DC | PRN
  Administered 2017-01-28: 650 mg via RECTAL
  Filled 2017-01-28: qty 1

## 2017-01-28 MED ORDER — PROPOFOL 1000 MG/100ML IV EMUL
INTRAVENOUS | Status: AC
  Filled 2017-01-28: qty 100

## 2017-01-28 MED ORDER — SODIUM CHLORIDE 3 % IV SOLN
INTRAVENOUS | Status: DC
  Administered 2017-01-28 – 2017-01-29 (×5): 75 mL/h via INTRAVENOUS
  Administered 2017-01-30 – 2017-01-31 (×2): 50 mL/h via INTRAVENOUS
  Filled 2017-01-28 (×13): qty 500

## 2017-01-28 MED ORDER — ROCURONIUM BROMIDE 10 MG/ML (PF) SYRINGE
PREFILLED_SYRINGE | INTRAVENOUS | Status: DC | PRN
  Administered 2017-01-28: 30 mg via INTRAVENOUS
  Administered 2017-01-28: 50 mg via INTRAVENOUS
  Administered 2017-01-28: 20 mg via INTRAVENOUS

## 2017-01-28 MED ORDER — SUCCINYLCHOLINE CHLORIDE 200 MG/10ML IV SOSY
PREFILLED_SYRINGE | INTRAVENOUS | Status: DC | PRN
  Administered 2017-01-28: 140 mg via INTRAVENOUS

## 2017-01-28 MED ORDER — IOPAMIDOL (ISOVUE-370) INJECTION 76%
INTRAVENOUS | Status: AC
  Filled 2017-01-28: qty 100

## 2017-01-28 MED ORDER — SODIUM CHLORIDE 23.4 % INJECTION (4 MEQ/ML) FOR IV ADMINISTRATION
30.0000 mL | Freq: Once | INTRAVENOUS | Status: AC
  Administered 2017-01-28: 30 mL via INTRAVENOUS
  Filled 2017-01-28: qty 30

## 2017-01-28 MED ORDER — PHENYLEPHRINE HCL 10 MG/ML IJ SOLN
INTRAMUSCULAR | Status: DC | PRN
  Administered 2017-01-28 (×2): 120 ug via INTRAVENOUS

## 2017-01-28 MED ORDER — FENTANYL CITRATE (PF) 100 MCG/2ML IJ SOLN
INTRAMUSCULAR | Status: DC | PRN
  Administered 2017-01-28 (×2): 100 ug via INTRAVENOUS

## 2017-01-28 MED ORDER — SODIUM CHLORIDE 0.9 % IV SOLN
INTRAVENOUS | Status: DC
  Administered 2017-01-28: 13:00:00 via INTRAVENOUS

## 2017-01-28 MED ORDER — PANTOPRAZOLE SODIUM 40 MG IV SOLR
40.0000 mg | INTRAVENOUS | Status: DC
  Administered 2017-01-28 – 2017-02-03 (×7): 40 mg via INTRAVENOUS
  Filled 2017-01-28 (×7): qty 40

## 2017-01-28 MED ORDER — PROPOFOL 10 MG/ML IV BOLUS
INTRAVENOUS | Status: DC | PRN
  Administered 2017-01-28: 200 mg via INTRAVENOUS

## 2017-01-28 MED ORDER — LIDOCAINE HCL 1 % IJ SOLN
INTRAMUSCULAR | Status: AC
  Filled 2017-01-28: qty 20

## 2017-01-28 MED ORDER — SODIUM CHLORIDE 0.9 % IV SOLN
INTRAVENOUS | Status: DC | PRN
  Administered 2017-01-28: 08:00:00 via INTRAVENOUS

## 2017-01-28 MED ORDER — CLOPIDOGREL BISULFATE 75 MG PO TABS
ORAL_TABLET | ORAL | Status: AC | PRN
  Administered 2017-01-28: 300 mg

## 2017-01-28 MED ORDER — ONDANSETRON HCL 4 MG/2ML IJ SOLN: 4.0000 mg | Freq: Four times a day (QID) | INTRAMUSCULAR | Status: DC | PRN

## 2017-01-28 MED ORDER — IOPAMIDOL (ISOVUE-300) INJECTION 61%
INTRAVENOUS | Status: AC
  Administered 2017-01-28: 38 mL
  Filled 2017-01-28: qty 150

## 2017-01-28 MED ORDER — FENTANYL 2500MCG IN NS 250ML (10MCG/ML) PREMIX INFUSION
25.0000 ug/h | INTRAVENOUS | Status: DC
  Administered 2017-01-29: 50 ug/h via INTRAVENOUS
  Administered 2017-01-31: 25 ug/h via INTRAVENOUS
  Administered 2017-02-01: 75 ug/h via INTRAVENOUS
  Filled 2017-01-28 (×3): qty 250

## 2017-01-28 MED ORDER — EPTIFIBATIDE 20 MG/10ML IV SOLN
INTRAVENOUS | Status: AC | PRN
  Administered 2017-01-28 (×6): .9 mg

## 2017-01-28 MED ORDER — SODIUM CHLORIDE 0.9 % IV SOLN
INTRAVENOUS | Status: DC | PRN
  Administered 2017-01-28 (×3): via INTRAVENOUS

## 2017-01-28 MED ORDER — MIDAZOLAM HCL 2 MG/2ML IJ SOLN
2.0000 mg | INTRAMUSCULAR | Status: DC | PRN
  Administered 2017-02-04: 2 mg via INTRAVENOUS
  Filled 2017-01-28 (×2): qty 2

## 2017-01-28 MED ORDER — FENTANYL BOLUS VIA INFUSION
50.0000 ug | INTRAVENOUS | Status: DC | PRN
  Administered 2017-01-31 – 2017-02-03 (×2): 50 ug via INTRAVENOUS
  Filled 2017-01-28: qty 50

## 2017-01-28 MED ORDER — CLEVIDIPINE BUTYRATE 0.5 MG/ML IV EMUL: 0.0000 mg/h | INTRAVENOUS | Status: DC

## 2017-01-28 MED ORDER — NITROGLYCERIN 1 MG/10 ML FOR IR/CATH LAB
INTRA_ARTERIAL | Status: AC | PRN
  Administered 2017-01-28: 25 ug via INTRA_ARTERIAL
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
## 2017-01-28 NOTE — Progress Notes (Signed)
RT assisted in Pt transported on vent from CT to 3M04. Pt's vitals remained stable throughout.

## 2017-01-28 NOTE — Procedures (Signed)
S/P [facility]t [facility] and  Bilateral common carotid arteriograms followed by endovascular complete revascularization of occluded Lt MCA M1  And Lt ICA with x 1 pass with 21mm x 40 mm soltaire FR retrieval device ,and of the ICA with x 1 pass with 57mm x 40 mm solitaire device and placement of x 2 stents in dissected prox 2/3  Lt ICA . TICI 3 reperfusion obtained.
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
## 2017-01-28 NOTE — Anesthesia Procedure Notes (Addendum)
Procedure Name: Intubation Date/Time: 01/28/2017 10:55 AM Performed by: Mervyn Gay Pre-anesthesia Checklist: Patient identified, Patient being monitored, Timeout performed, Emergency Drugs available and Suction available Patient Re-evaluated:Patient Re-evaluated prior to inductionOxygen Delivery Method: Circle System Utilized Preoxygenation: Pre-oxygenation with 100% oxygen Intubation Type: Inhalational induction with existing ETT Laryngoscope Size: 3 and Mac Grade View: Grade III Tube type: Subglottic suction tube Tube size: 8.0 mm Number of attempts: 3 Airway Equipment and Method: Stylet Placement Confirmation: ETT inserted through vocal cords under direct vision,  positive ETCO2 and breath sounds checked- equal and bilateral Secured at: 21 cm Tube secured with: Tape Dental Injury: Teeth and Oropharynx as per pre-operative assessment  Difficulty Due To: Difficult Airway-  due to edematous airway Future Recommendations: Recommend- induction with short-acting agent, and alternative techniques readily available Comments: First DL by Ronny Bacon CRNA - esoph intubation. Immediately recognized and removed. Second DL by Dr. Sigmund Hazel esoph intubation. Third DL -> AOI by Dr. Conrad [place].
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
## 2017-01-28 NOTE — Anesthesia Postprocedure Evaluation (Signed)
Anesthesia Post Note  Patient: Arthur Beasley  Procedure(s) Performed: Procedure(s) (LRB): RADIOLOGY WITH ANESTHESIA (N/A)  Patient location during evaluation: SICU Anesthesia Type: General Level of consciousness: sedated Pain management: pain level controlled Vital Signs Assessment: post-procedure vital signs reviewed and stable Respiratory status: patient remains intubated per anesthesia plan Cardiovascular status: stable Anesthetic complications: no       Last Vitals:  Vitals:   01/28/17 1110 01/28/17 1146  BP:  118/79  Pulse: 92 (!) 103  Resp: 14 14  Temp:      Last Pain:  Vitals:   01/28/17 0400  TempSrc:   PainSc: [place] DAVID
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
## 2017-01-28 NOTE — Anesthesia Procedure Notes (Addendum)
Procedure Name: Intubation Date/Time: 01/28/2017 8:14 AM Performed by: Mervyn Gay Pre-anesthesia Checklist: Patient identified, Patient being monitored, Timeout performed, Emergency Drugs available and Suction available Patient Re-evaluated:Patient Re-evaluated prior to inductionOxygen Delivery Method: Circle System Utilized Preoxygenation: Pre-oxygenation with 100% oxygen Intubation Type: IV induction, Rapid sequence and Cricoid Pressure applied Laryngoscope Size: Mac and 4 Grade View: Grade I Tube type: Oral Tube size: 7.5 mm Number of attempts: 1 Airway Equipment and Method: Stylet Placement Confirmation: ETT inserted through vocal cords under direct vision,  positive ETCO2 and breath sounds checked- equal and bilateral Secured at: 22 cm Tube secured with: Tape Dental Injury: Teeth and Oropharynx as per pre-operative assessment  Comments: RSI with cricoid pressure. Lots of blood noted in hypopharynx

## 2017-01-28 NOTE — Progress Notes (Signed)
Upon assessment, pt with left gaze, aphasic, able to follow commands left side, completely flaccid on right.  Code stroke called, pt taken to CT 1, code stroke team met pt and assumed care.
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
## 2017-01-28 NOTE — Progress Notes (Signed)
**  Preliminary report by tech**  Left carotid artery duplex complete. Less than fifty percent stenosis in the left internal carotid artery based upon stent criteria. The left vertebral artery demonstrated antegrade flow.  01/28/17 4:12 PM Arthur Beasley RVT
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
## 2017-01-28 NOTE — Progress Notes (Signed)
Dr. Rosendo Gros updated on patient's neuro status change, MD to assess patient.
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
## 2017-01-28 NOTE — Sedation Documentation (Signed)
Bedside report given to Rn; pulses and groin WDL.

## 2017-01-28 NOTE — Transfer of Care (Signed)
Immediate Anesthesia Transfer of Care Note  Patient: Arthur Beasley  Procedure(s) Performed: Procedure(s): RADIOLOGY WITH ANESTHESIA (N/A)  Patient Location: ICU  Anesthesia Type:General  Level of Consciousness: sedated and Patient remains intubated per anesthesia plan  Airway & Oxygen Therapy: Patient remains intubated per anesthesia plan and Patient placed on Ventilator (see vital sign flow sheet for setting)  Post-op Assessment: Report given to RN and Post -op Vital signs reviewed and stable  Post vital signs: Reviewed and stable  Last Vitals:  Vitals:   01/28/17 0700 01/28/17 0800  BP: (!) 143/109 (!) 159/97  Pulse: 82   Resp: 14 13  Temp:      Last Pain:  Vitals:   01/28/17 0400  TempSrc:   PainSc: Asleep      Patients Stated Pain Goal: 3 (41/63/84 5364)  Complications: No apparent anesthesia complications
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
## 2017-01-29 ENCOUNTER — Inpatient Hospital Stay (HOSPITAL_COMMUNITY): Payer: Medicaid Other

## 2017-01-29 ENCOUNTER — Encounter (HOSPITAL_COMMUNITY): Payer: Self-pay | Admitting: Anesthesiology

## 2017-01-29 ENCOUNTER — Inpatient Hospital Stay (HOSPITAL_COMMUNITY): Payer: Medicaid Other | Admitting: Anesthesiology

## 2017-01-29 ENCOUNTER — Encounter (HOSPITAL_COMMUNITY): Admission: EM | Disposition: A | Discharge status: Rehabilitation Facility | Payer: Self-pay | Source: Home / Self Care

## 2017-01-29 DIAGNOSIS — I6522 Occlusion and stenosis of left carotid artery: Secondary | ICD-10-CM | POA: Diagnosis present

## 2017-01-29 HISTORY — PX: CRANIOTOMY: SHX93

## 2017-01-29 LAB — CBC
HCT: 26 % — ABNORMAL LOW (ref 39.0–52.0)
Hemoglobin: 8.7 g/dL — ABNORMAL LOW (ref 13.0–17.0)
MCH: 30.2 pg (ref 26.0–34.0)
MCHC: 33.5 g/dL (ref 30.0–36.0)
MCV: 90.3 fL (ref 78.0–100.0)
PLATELETS: 135 10*3/uL — AB (ref 150–400)
RBC: 2.88 MIL/uL — ABNORMAL LOW (ref 4.22–5.81)
RDW: 14.2 % (ref 11.5–15.5)
WBC: 9.5 10*3/uL (ref 4.0–10.5)

## 2017-01-29 LAB — PHOSPHORUS
Phosphorus: <1 mg/dL — CL (ref 2.5–4.6)
Phosphorus: <1 mg/dL — CL (ref 2.5–4.6)

## 2017-01-29 LAB — BASIC METABOLIC PANEL
ANION GAP: 4 — AB (ref 5–15)
BUN: 8 mg/dL (ref 6–20)
CALCIUM: 7.2 mg/dL — AB (ref 8.9–10.3)
CO2: 21 mmol/L — AB (ref 22–32)
Chloride: 121 mmol/L — ABNORMAL HIGH (ref 101–111)
Creatinine, Ser: 0.77 mg/dL (ref 0.61–1.24)
GFR calc non Af Amer: >60 mL/min (ref >60)
Glucose, Bld: 171 mg/dL — ABNORMAL HIGH (ref 65–99)
Potassium: 3.8 mmol/L (ref 3.5–5.1)
SODIUM: 146 mmol/L — AB (ref 135–145)

## 2017-01-29 LAB — CBC WITH DIFFERENTIAL/PLATELET
BASOS ABS: 0 10*3/uL (ref 0.0–0.1)
BASOS PCT: 0 %
EOS ABS: 0 10*3/uL (ref 0.0–0.7)
Eosinophils Relative: 0 %
HEMATOCRIT: 23.5 % — AB (ref 39.0–52.0)
HEMOGLOBIN: 7.8 g/dL — AB (ref 13.0–17.0)
Lymphocytes Relative: 8 %
Lymphs Abs: 0.7 10*3/uL (ref 0.7–4.0)
MCH: 29.8 pg (ref 26.0–34.0)
MCHC: 33.2 g/dL (ref 30.0–36.0)
MCV: 89.7 fL (ref 78.0–100.0)
MONO ABS: 1.4 10*3/uL — AB (ref 0.1–1.0)
Monocytes Relative: 15 %
NEUTROS ABS: 6.9 10*3/uL (ref 1.7–7.7)
NEUTROS PCT: 77 %
Platelets: 145 10*3/uL — ABNORMAL LOW (ref 150–400)
RBC: 2.62 MIL/uL — ABNORMAL LOW (ref 4.22–5.81)
RDW: 14.1 % (ref 11.5–15.5)
WBC: 8.9 10*3/uL (ref 4.0–10.5)

## 2017-01-29 LAB — VAS US CAROTID
LEFT ECA DIAS: 34 cm/s
LEFT VERTEBRAL DIAS: 17 cm/s
Left CCA dist dias: 22 cm/s
Left CCA dist sys: 48 cm/s
Left CCA prox dias: 30 cm/s
Left CCA prox sys: 134 cm/s
Left ICA dist dias: -24 cm/s
Left ICA dist sys: -59 cm/s
Left ICA prox dias: 25 cm/s
Left ICA prox sys: 53 cm/s
Right CCA prox dias: 25 cm/s
Right CCA prox sys: 128 cm/s

## 2017-01-29 LAB — BLOOD GAS, ARTERIAL
Acid-base deficit: 3.6 mmol/L — ABNORMAL HIGH (ref 0.0–2.0)
Bicarbonate: 20.2 mmol/L (ref 20.0–28.0)
FIO2: 40
LHR: 14 {breaths}/min
O2 SAT: 99.4 %
PCO2 ART: 32.4 mmHg (ref 32.0–48.0)
PEEP: 5 cmH2O
Patient temperature: 98.6
VT: 530 mL
pH, Arterial: 7.412 (ref 7.350–7.450)
pO2, Arterial: 196 mmHg — ABNORMAL HIGH (ref 83.0–108.0)

## 2017-01-29 LAB — MAGNESIUM
MAGNESIUM: 1.9 mg/dL (ref 1.7–2.4)
MAGNESIUM: 1.9 mg/dL (ref 1.7–2.4)

## 2017-01-29 LAB — GLUCOSE, CAPILLARY
GLUCOSE-CAPILLARY: 147 mg/dL — AB (ref 65–99)
GLUCOSE-CAPILLARY: 141 mg/dL — AB (ref 65–99)
Glucose-Capillary: 125 mg/dL — ABNORMAL HIGH (ref 65–99)

## 2017-01-29 LAB — PREPARE RBC (CROSSMATCH)

## 2017-01-29 LAB — SODIUM
SODIUM: 153 mmol/L — AB (ref 135–145)
Sodium: 156 mmol/L — ABNORMAL HIGH (ref 135–145)

## 2017-01-29 LAB — TRIGLYCERIDES: TRIGLYCERIDES: 92 mg/dL (ref <150)

## 2017-01-29 SURGERY — CRANIOTOMY HEMATOMA EVACUATION SUBDURAL
Anesthesia: General | Site: Head | Laterality: Left

## 2017-01-29 MED ORDER — SODIUM CHLORIDE 0.9 % IV SOLN
Freq: Once | INTRAVENOUS | Status: AC
  Administered 2017-01-29: 13:00:00 via INTRAVENOUS

## 2017-01-29 MED ORDER — DEXAMETHASONE SODIUM PHOSPHATE 10 MG/ML IJ SOLN
INTRAMUSCULAR | Status: AC
  Filled 2017-01-29: qty 1

## 2017-01-29 MED ORDER — MORPHINE SULFATE (PF) 2 MG/ML IV SOLN: 1.0000 mg | INTRAVENOUS | Status: DC | PRN

## 2017-01-29 MED ORDER — MANNITOL 25 % IV SOLN
50.0000 g | Freq: Once | INTRAVENOUS | Status: AC
  Administered 2017-01-29: 50 g via INTRAVENOUS

## 2017-01-29 MED ORDER — ROCURONIUM BROMIDE 100 MG/10ML IV SOLN
INTRAVENOUS | Status: DC | PRN
  Administered 2017-01-29 (×2): 50 mg via INTRAVENOUS

## 2017-01-29 MED ORDER — SODIUM CHLORIDE 0.9% FLUSH
10.0000 mL | Freq: Two times a day (BID) | INTRAVENOUS | Status: DC
  Administered 2017-01-29 – 2017-01-31 (×5): 10 mL
  Administered 2017-01-31 – 2017-02-01 (×2): 40 mL
  Administered 2017-02-01: 10 mL
  Administered 2017-02-02: 20 mL
  Administered 2017-02-04 – 2017-02-05 (×2): 10 mL
  Administered 2017-02-06: 20 mL
  Administered 2017-02-06 (×2): 10 mL
  Administered 2017-02-07 – 2017-02-08 (×2): 20 mL
  Administered 2017-02-09 – 2017-02-11 (×4): 10 mL
  Administered 2017-02-12: 20 mL
  Administered 2017-02-13 – 2017-02-15 (×4): 10 mL
  Administered 2017-02-17: 40 mL

## 2017-01-29 MED ORDER — SODIUM CHLORIDE 0.9 % IV SOLN
500.0000 mg | Freq: Two times a day (BID) | INTRAVENOUS | Status: DC
  Administered 2017-01-29 – 2017-02-19 (×43): 500 mg via INTRAVENOUS
  Filled 2017-01-29 (×45): qty 5

## 2017-01-29 MED ORDER — POTASSIUM PHOSPHATES 15 MMOLE/5ML IV SOLN
40.0000 meq | Freq: Once | INTRAVENOUS | Status: AC
  Administered 2017-01-29: 40 meq via INTRAVENOUS
  Filled 2017-01-29: qty 9.09

## 2017-01-29 MED ORDER — POTASSIUM CHLORIDE IN NACL 20-0.9 MEQ/L-% IV SOLN: INTRAVENOUS | Status: DC

## 2017-01-29 MED ORDER — VITAL HIGH PROTEIN PO LIQD
1000.0000 mL | ORAL | Status: DC
  Administered 2017-01-29: 08:00:00
  Administered 2017-01-29: 1000 mL

## 2017-01-29 MED ORDER — FENTANYL CITRATE (PF) 100 MCG/2ML IJ SOLN
INTRAMUSCULAR | Status: AC
  Filled 2017-01-29: qty 4

## 2017-01-29 MED ORDER — MIDAZOLAM HCL 5 MG/5ML IJ SOLN
INTRAMUSCULAR | Status: DC | PRN
  Administered 2017-01-29: 2 mg via INTRAVENOUS

## 2017-01-29 MED ORDER — BACITRACIN ZINC 500 UNIT/GM EX OINT
TOPICAL_OINTMENT | CUTANEOUS | Status: AC
  Filled 2017-01-29: qty 28.35

## 2017-01-29 MED ORDER — MANNITOL 25 % IV SOLN
INTRAVENOUS | Status: AC
  Filled 2017-01-29: qty 50

## 2017-01-29 MED ORDER — THROMBIN 20000 UNITS EX SOLR
CUTANEOUS | Status: AC
  Filled 2017-01-29: qty 20000

## 2017-01-29 MED ORDER — PRO-STAT SUGAR FREE PO LIQD: 30.0000 mL | Freq: Two times a day (BID) | ORAL | Status: DC

## 2017-01-29 MED ORDER — FENTANYL CITRATE (PF) 100 MCG/2ML IJ SOLN
INTRAMUSCULAR | Status: DC | PRN
  Administered 2017-01-29: 100 ug via INTRAVENOUS
  Administered 2017-01-29 (×2): 50 ug via INTRAVENOUS

## 2017-01-29 MED ORDER — PHENYLEPHRINE HCL 10 MG/ML IJ SOLN
INTRAVENOUS | Status: DC | PRN
  Administered 2017-01-29: 25 ug/min via INTRAVENOUS

## 2017-01-29 MED ORDER — HYDRALAZINE HCL 20 MG/ML IJ SOLN
10.0000 mg | INTRAMUSCULAR | Status: DC | PRN
  Administered 2017-01-29 – 2017-01-31 (×3): 10 mg via INTRAVENOUS
  Filled 2017-01-29 (×3): qty 1

## 2017-01-29 MED ORDER — PROMETHAZINE HCL 25 MG/ML IJ SOLN: 6.2500 mg | INTRAMUSCULAR | Status: DC | PRN

## 2017-01-29 MED ORDER — MIDAZOLAM HCL 2 MG/2ML IJ SOLN
INTRAMUSCULAR | Status: AC
  Filled 2017-01-29: qty 2

## 2017-01-29 MED ORDER — NALOXONE HCL 0.4 MG/ML IJ SOLN: 0.0800 mg | INTRAMUSCULAR | Status: DC | PRN

## 2017-01-29 MED ORDER — ROCURONIUM BROMIDE 50 MG/5ML IV SOSY
PREFILLED_SYRINGE | INTRAVENOUS | Status: AC
  Filled 2017-01-29: qty 10

## 2017-01-29 MED ORDER — PIVOT 1.5 CAL PO LIQD
1000.0000 mL | ORAL | Status: DC
  Administered 2017-01-29: 1000 mL

## 2017-01-29 MED ORDER — HYDROCODONE-ACETAMINOPHEN 5-325 MG PO TABS: 1.0000 | ORAL_TABLET | ORAL | Status: DC | PRN

## 2017-01-29 MED ORDER — PROMETHAZINE HCL 25 MG PO TABS: 12.5000 mg | ORAL_TABLET | ORAL | Status: DC | PRN

## 2017-01-29 MED ORDER — MANNITOL 20 % IV SOLN
50.0000 g | Freq: Once | Status: DC
  Filled 2017-01-29: qty 250

## 2017-01-29 MED ORDER — SURGIFOAM 100 EX MISC
CUTANEOUS | Status: DC | PRN
  Administered 2017-01-29: 20 mL via TOPICAL

## 2017-01-29 MED ORDER — ADULT MULTIVITAMIN LIQUID CH
15.0000 mL | Freq: Every day | ORAL | Status: DC
  Administered 2017-01-29 – 2017-02-19 (×22): 15 mL
  Filled 2017-01-29 (×22): qty 15

## 2017-01-29 MED ORDER — LIDOCAINE-EPINEPHRINE (PF) 2 %-1:200000 IJ SOLN
INTRAMUSCULAR | Status: AC
  Filled 2017-01-29: qty 20

## 2017-01-29 MED ORDER — HYDROMORPHONE HCL 1 MG/ML IJ SOLN: 0.2500 mg | INTRAMUSCULAR | Status: DC | PRN

## 2017-01-29 MED ORDER — 0.9 % SODIUM CHLORIDE (POUR BTL) OPTIME
TOPICAL | Status: DC | PRN
  Administered 2017-01-29 (×3): 1000 mL

## 2017-01-29 MED ORDER — SODIUM CHLORIDE 0.9% FLUSH
10.0000 mL | INTRAVENOUS | Status: DC | PRN
  Administered 2017-02-17: 10 mL
  Filled 2017-01-29: qty 40

## 2017-01-29 MED ORDER — CHLORHEXIDINE GLUCONATE CLOTH 2 % EX PADS
6.0000 | MEDICATED_PAD | Freq: Every day | CUTANEOUS | Status: DC
  Administered 2017-01-30 – 2017-02-11 (×16): 6 via TOPICAL

## 2017-01-29 MED ORDER — DEXMEDETOMIDINE HCL IN NACL 200 MCG/50ML IV SOLN
INTRAVENOUS | Status: AC
  Filled 2017-01-29: qty 50

## 2017-01-29 MED ORDER — PROPOFOL 10 MG/ML IV BOLUS
INTRAVENOUS | Status: AC
  Filled 2017-01-29: qty 20

## 2017-01-29 MED ORDER — SODIUM CHLORIDE 0.9 % IV SOLN
INTRAVENOUS | Status: DC | PRN
  Administered 2017-01-29: 10:00:00 via INTRAVENOUS

## 2017-01-29 SURGICAL SUPPLY — 76 items
APL SKNCLS STERI-STRIP NONHPOA (GAUZE/BANDAGES/DRESSINGS)
BANDAGE GAUZE 4  KLING STR (GAUZE/BANDAGES/DRESSINGS) ×2 IMPLANT
BENZOIN TINCTURE PRP APPL 2/3 (GAUZE/BANDAGES/DRESSINGS) IMPLANT
BLADE CLIPPER SURG (BLADE) ×3 IMPLANT
BLADE ULTRA TIP 2M (BLADE) ×3 IMPLANT
BNDG GAUZE ELAST 4 BULKY (GAUZE/BANDAGES/DRESSINGS) ×4 IMPLANT
BUR ACORN 6.0 PRECISION (BURR) ×2 IMPLANT
BUR ACORN 6.0MM PRECISION (BURR) ×1
BUR MATCHSTICK NEURO 3.0 LAGG (BURR) IMPLANT
BUR SPIRAL ROUTER 2.3 (BUR) IMPLANT
BUR SPIRAL ROUTER 2.3MM (BUR)
CANISTER SUCT 3000ML PPV (MISCELLANEOUS) ×3 IMPLANT
CARTRIDGE OIL MAESTRO DRILL (MISCELLANEOUS) ×1 IMPLANT
CLIP RANEY DISP (INSTRUMENTS) ×4 IMPLANT
CLIP TI MEDIUM 6 (CLIP) ×2 IMPLANT
DIFFUSER DRILL AIR PNEUMATIC (MISCELLANEOUS) ×3 IMPLANT
DRAPE INCISE IOBAN 66X45 STRL (DRAPES) ×2 IMPLANT
DRAPE NEUROLOGICAL W/INCISE (DRAPES) ×3 IMPLANT
DRAPE SURG 17X23 STRL (DRAPES) IMPLANT
DRAPE WARM FLUID 44X44 (DRAPE) ×3 IMPLANT
DURAMATRIX ONLAY 3X3 (Plate) ×4 IMPLANT
DURAPREP 6ML APPLICATOR 50/CS (WOUND CARE) ×3 IMPLANT
ELECT CAUTERY BLADE 6.4 (BLADE) ×3 IMPLANT
ELECT REM PT RETURN 9FT ADLT (ELECTROSURGICAL) ×3
ELECTRODE REM PT RTRN 9FT ADLT (ELECTROSURGICAL) ×1 IMPLANT
EVACUATOR 1/8 PVC DRAIN (DRAIN) IMPLANT
EVACUATOR SILICONE 100CC (DRAIN) IMPLANT
GAUZE SPONGE 4X4 12PLY STRL (GAUZE/BANDAGES/DRESSINGS) ×3 IMPLANT
GAUZE SPONGE 4X4 16PLY XRAY LF (GAUZE/BANDAGES/DRESSINGS) IMPLANT
GLOVE BIOGEL PI IND STRL 7.5 (GLOVE) IMPLANT
GLOVE BIOGEL PI INDICATOR 7.5 (GLOVE) ×2
GLOVE ECLIPSE 6.5 STRL STRAW (GLOVE) ×3 IMPLANT
GLOVE EXAM NITRILE LRG STRL (GLOVE) IMPLANT
GLOVE EXAM NITRILE XL STR (GLOVE) IMPLANT
GLOVE EXAM NITRILE XS STR PU (GLOVE) IMPLANT
GLOVE SS BIOGEL STRL SZ 7 (GLOVE) IMPLANT
GLOVE SUPERSENSE BIOGEL SZ 7 (GLOVE) ×2
GOWN STRL REUS W/ TWL LRG LVL3 (GOWN DISPOSABLE) ×2 IMPLANT
GOWN STRL REUS W/ TWL XL LVL3 (GOWN DISPOSABLE) IMPLANT
GOWN STRL REUS W/TWL 2XL LVL3 (GOWN DISPOSABLE) IMPLANT
GOWN STRL REUS W/TWL LRG LVL3 (GOWN DISPOSABLE) ×6
GOWN STRL REUS W/TWL XL LVL3 (GOWN DISPOSABLE)
HEMOSTAT SURGICEL 2X14 (HEMOSTASIS) IMPLANT
HOOK DURA 1/2IN (MISCELLANEOUS) ×2 IMPLANT
KIT BASIN OR (CUSTOM PROCEDURE TRAY) ×3 IMPLANT
KIT ROOM TURNOVER OR (KITS) ×3 IMPLANT
NDL HYPO 25X1 1.5 SAFETY (NEEDLE) ×1 IMPLANT
NEEDLE HYPO 25X1 1.5 SAFETY (NEEDLE) ×3 IMPLANT
NS IRRIG 1000ML POUR BTL (IV SOLUTION) ×3 IMPLANT
OIL CARTRIDGE MAESTRO DRILL (MISCELLANEOUS) ×3
PACK CRANIOTOMY (CUSTOM PROCEDURE TRAY) ×3 IMPLANT
PATTIES SURGICAL .5 X.5 (GAUZE/BANDAGES/DRESSINGS) IMPLANT
PATTIES SURGICAL .5 X3 (DISPOSABLE) IMPLANT
PATTIES SURGICAL 1X1 (DISPOSABLE) IMPLANT
SPONGE GAUZE 4X4 12PLY STER LF (GAUZE/BANDAGES/DRESSINGS) ×2 IMPLANT
SPONGE NEURO XRAY DETECT 1X3 (DISPOSABLE) IMPLANT
SPONGE SURGIFOAM ABS GEL 100 (HEMOSTASIS) ×3 IMPLANT
STAPLER SKIN PROX WIDE 3.9 (STAPLE) ×4 IMPLANT
STAPLER VISISTAT 35W (STAPLE) ×3 IMPLANT
SUT ETHILON 3 0 FSL (SUTURE) IMPLANT
SUT ETHILON 3 0 PS 1 (SUTURE) IMPLANT
SUT NURALON 4 0 TR CR/8 (SUTURE) ×9 IMPLANT
SUT STEEL 0 (SUTURE)
SUT STEEL 0 18XMFL TIE 17 (SUTURE) IMPLANT
SUT VIC AB 2-0 CP2 18 (SUTURE) IMPLANT
SUT VIC AB 2-0 CT2 18 VCP726D (SUTURE) ×14 IMPLANT
SYR CONTROL 10ML LL (SYRINGE) ×3 IMPLANT
TOWEL GREEN STERILE (TOWEL DISPOSABLE) ×2 IMPLANT
TOWEL GREEN STERILE FF (TOWEL DISPOSABLE) ×3 IMPLANT
TRAY FOLEY W/METER SILVER 16FR (SET/KITS/TRAYS/PACK) ×3 IMPLANT
TUBE CONNECTING 12'X1/4 (SUCTIONS) ×1
TUBE CONNECTING 12X1/4 (SUCTIONS) ×2 IMPLANT
TUBE CONNECTING 20'X1/4 (TUBING) ×1
TUBE CONNECTING 20X1/4 (TUBING) ×1 IMPLANT
UNDERPAD 30X30 (UNDERPADS AND DIAPERS) ×3 IMPLANT
WATER STERILE IRR 1000ML POUR (IV SOLUTION) ×3 IMPLANT

## 2017-01-29 NOTE — Anesthesia Postprocedure Evaluation (Signed)
Anesthesia Post Note  Patient: Arthur Beasley  Procedure(s) Performed: Procedure(s) (LRB): Left Hemi-Craniectomy (Left)  Patient location during evaluation: SICU Anesthesia Type: General Level of consciousness: sedated Pain management: pain level controlled Vital Signs Assessment: post-procedure vital signs reviewed and stable Respiratory status: patient remains intubated per anesthesia plan Cardiovascular status: stable Anesthetic complications: no       Last Vitals:  Vitals:   01/29/17 1500 01/29/17 1600  BP: 126/79 116/65  Pulse: 95 95  Resp: 15 15  Temp:  36.7 C    Last Pain:  Vitals:   01/29/17 1600  TempSrc: Oral  PainSc:                  Randolf Sansoucie S

## 2017-01-29 NOTE — Progress Notes (Signed)
Patient transported from 3M04 to CT and back without any complications. 

## 2017-01-29 NOTE — Progress Notes (Signed)
Peripherally Inserted Central Catheter/Midline Placement  The IV Nurse has discussed with the patient and/or persons authorized to consent for the patient, the purpose of this procedure and the potential benefits and risks involved with this procedure.  The benefits include less needle sticks, lab draws from the catheter, and the patient may be discharged home with the catheter. Risks include, but not limited to, infection, bleeding, blood clot (thrombus formation), and puncture of an artery; nerve damage and irregular heartbeat and possibility to perform a PICC exchange if needed/ordered by physician.  Alternatives to this procedure were also discussed.  B[employer] Power PICC patient education guide, fact sheet on infection prevention and patient information card has been provided to patient /or left at bedside.    PICC/Midline Placement Documentation    Consent signed by sister at bedside    Synthia Innocent 01/29/2017, 9:01 AM
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
## 2017-01-29 NOTE — Transfer of Care (Signed)
Immediate Anesthesia Transfer of Care Note  Patient: Arthur Beasley  Procedure(s) Performed: Procedure(s): Left Hemi-Craniectomy (Left)  Patient Location: ICU  Anesthesia Type:General  Level of Consciousness: sedated, unresponsive and Patient remains intubated per anesthesia plan  Airway & Oxygen Therapy: Patient remains intubated per anesthesia plan and Patient placed on Ventilator (see vital sign flow sheet for setting)  Post-op Assessment: Report given to RN and Post -op Vital signs reviewed and stable  Post vital signs: Reviewed and stable  Last Vitals:  Vitals:   01/29/17 0900 01/29/17 1000  BP: (!) 143/86 134/88  Pulse: 98 75  Resp: 18 19  Temp:      Last Pain:  Vitals:   01/29/17 0400  TempSrc: Axillary  PainSc:       Patients Stated Pain Goal: 3 (24/82/50 0370)  Complications: No apparent anesthesia complications
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
## 2017-01-29 NOTE — Progress Notes (Signed)
Pt taken to OR.

## 2017-01-29 NOTE — Anesthesia Preprocedure Evaluation (Signed)
Anesthesia Evaluation  Patient identified by MRN, date of birth, ID band Patient unresponsive    Reviewed: Unable to perform ROS - Chart review only  Airway Mallampati: II  TM Distance: >3 FB Neck ROM: Full    Dental no notable dental hx.    Pulmonary neg pulmonary ROS,    Pulmonary exam normal breath sounds clear to auscultation       Cardiovascular negative cardio ROS Normal cardiovascular exam Rhythm:Regular Rate:Normal     Neuro/Psych negative neurological ROS  negative psych ROS   GI/Hepatic negative GI ROS, Neg liver ROS,   Endo/Other  negative endocrine ROS  Renal/GU negative Renal ROS  negative genitourinary   Musculoskeletal negative musculoskeletal ROS (+)   Abdominal   Peds negative pediatric ROS (+)  Hematology  (+) anemia ,   Anesthesia Other Findings   Reproductive/Obstetrics negative OB ROS                             Anesthesia Physical Anesthesia Plan  ASA: III  Anesthesia Plan: General   Post-op Pain Management:    Induction: Inhalational  Airway Management Planned: Oral ETT  Additional Equipment: Arterial line  Intra-op Plan:   Post-operative Plan: Post-operative intubation/ventilation  Informed Consent: I have reviewed the patients History and Physical, chart, labs and discussed the procedure including the risks, benefits and alternatives for the proposed anesthesia with the patient or authorized representative who has indicated his/her understanding and acceptance.   Dental advisory given  Plan Discussed with: CRNA and Surgeon  Anesthesia Plan Comments:         Anesthesia Quick Evaluation

## 2017-01-29 NOTE — Brief Op Note (Signed)
Surgeon unable to implant bone flap into abdomen due to patient's size

## 2017-01-29 NOTE — Progress Notes (Signed)
Patient ID: Arthur Beasley, male   DOB: [birth year], 36 y.o.   MRN: [id number] BP (!) 143/86   Pulse 98   Temp 98.7 F (37.1 C) (Axillary)   Resp 18   Ht 5\' 7"  (1.702 m)   Wt 54.2 kg (119 lb 7.8 oz)   SpO2 100%   BMI 18.71 kg/m  Not responsive Pupils round regular reactive, though sluggish Localizing with left upper extremity I have spoken with the family, and explained that he is in the early stage of herniating due to Left MCA infarct. I have discussed with them and gained consent to perform a Left hemicraniectomy to prevent cerebral herniation. I believe this is an extraordinary measure as the infarct involves the Left MCA territory. I explained in detail to them why this is not a common operation, that the left brain is vitally important and that this operation will not change the damage already done to the brain. The sole purpose of this procedure is to preserve life. Risks including stroke,coma, death, further brain damage, bleeding infection, stent failure, and other risks were discussed. The family expressed understanding and wishes to proceed.

## 2017-01-29 NOTE — Progress Notes (Signed)
Patient ID: Arthur Beasley, male   DOB: [birth year], 36 y.o.   MRN: [id number] I spoke with his sister at the bedside. Georganna Skeans, MD, MPH, FACS Trauma: [phone] General Surgery: [phone]

## 2017-01-29 NOTE — Progress Notes (Signed)
Critical low phos (<1) called. Result given to Lattie Haw, [place]

## 2017-01-29 NOTE — Progress Notes (Signed)
Patient was in OR during 1200 check.

## 2017-01-30 ENCOUNTER — Encounter (HOSPITAL_COMMUNITY): Payer: Self-pay | Admitting: *Deleted

## 2017-01-30 ENCOUNTER — Inpatient Hospital Stay (HOSPITAL_COMMUNITY): Payer: Medicaid Other

## 2017-01-30 DIAGNOSIS — Z9889 Other specified postprocedural states: Secondary | ICD-10-CM

## 2017-01-30 DIAGNOSIS — W3400XA Accidental discharge from unspecified firearms or gun, initial encounter: Secondary | ICD-10-CM

## 2017-01-30 LAB — SODIUM
SODIUM: 158 mmol/L — AB (ref 135–145)
SODIUM: 158 mmol/L — AB (ref 135–145)
Sodium: 159 mmol/L — ABNORMAL HIGH (ref 135–145)

## 2017-01-30 LAB — GLUCOSE, CAPILLARY
GLUCOSE-CAPILLARY: 168 mg/dL — AB (ref 65–99)
GLUCOSE-CAPILLARY: 130 mg/dL — AB (ref 65–99)
Glucose-Capillary: 133 mg/dL — ABNORMAL HIGH (ref 65–99)
Glucose-Capillary: 133 mg/dL — ABNORMAL HIGH (ref 65–99)
Glucose-Capillary: 135 mg/dL — ABNORMAL HIGH (ref 65–99)
Glucose-Capillary: 153 mg/dL — ABNORMAL HIGH (ref 65–99)

## 2017-01-30 LAB — BLOOD GAS, ARTERIAL
Acid-base deficit: 1.6 mmol/L (ref 0.0–2.0)
BICARBONATE: 22.2 mmol/L (ref 20.0–28.0)
DRAWN BY: 257081
FIO2: 40
MECHVT: 530 mL
O2 SAT: 99.4 %
PCO2 ART: 34.6 mmHg (ref 32.0–48.0)
PEEP/CPAP: 5 cmH2O
PH ART: 7.423 (ref 7.350–7.450)
Patient temperature: 98.6
RATE: 13 resp/min
pO2, Arterial: 188 mmHg — ABNORMAL HIGH (ref 83.0–108.0)

## 2017-01-30 LAB — PHOSPHORUS
Phosphorus: 1.4 mg/dL — ABNORMAL LOW (ref 2.5–4.6)
Phosphorus: 1.6 mg/dL — ABNORMAL LOW (ref 2.5–4.6)

## 2017-01-30 LAB — CBC
HEMATOCRIT: 19.2 % — AB (ref 39.0–52.0)
Hemoglobin: 6.4 g/dL — CL (ref 13.0–17.0)
MCH: 29.9 pg (ref 26.0–34.0)
MCHC: 33.3 g/dL (ref 30.0–36.0)
MCV: 89.7 fL (ref 78.0–100.0)
PLATELETS: 121 10*3/uL — AB (ref 150–400)
RBC: 2.14 MIL/uL — AB (ref 4.22–5.81)
RDW: 14.8 % (ref 11.5–15.5)
WBC: 7 10*3/uL (ref 4.0–10.5)

## 2017-01-30 LAB — BASIC METABOLIC PANEL
BUN: 11 mg/dL (ref 6–20)
CO2: 22 mmol/L (ref 22–32)
CREATININE: 0.74 mg/dL (ref 0.61–1.24)
Calcium: 7.1 mg/dL — ABNORMAL LOW (ref 8.9–10.3)
GFR calc Af Amer: >60 mL/min (ref >60)
GFR calc non Af Amer: >60 mL/min (ref >60)
GLUCOSE: 167 mg/dL — AB (ref 65–99)
Potassium: 3 mmol/L — ABNORMAL LOW (ref 3.5–5.1)
Sodium: 155 mmol/L — ABNORMAL HIGH (ref 135–145)

## 2017-01-30 LAB — MAGNESIUM
MAGNESIUM: 2.2 mg/dL (ref 1.7–2.4)
Magnesium: 2 mg/dL (ref 1.7–2.4)

## 2017-01-30 LAB — PREPARE RBC (CROSSMATCH)

## 2017-01-30 LAB — HEMOGLOBIN AND HEMATOCRIT, BLOOD
HCT: 23.2 % — ABNORMAL LOW (ref 39.0–52.0)
HEMOGLOBIN: 8 g/dL — AB (ref 13.0–17.0)

## 2017-01-30 MED ORDER — SODIUM CHLORIDE 0.9 % IV SOLN
Freq: Once | INTRAVENOUS | Status: AC
  Administered 2017-01-30: 07:00:00 via INTRAVENOUS

## 2017-01-30 MED ORDER — PIVOT 1.5 CAL PO LIQD
1000.0000 mL | ORAL | Status: DC
  Administered 2017-01-30 – 2017-02-12 (×15): 1000 mL
  Filled 2017-01-30: qty 1000

## 2017-01-30 MED ORDER — POTASSIUM PHOSPHATES 15 MMOLE/5ML IV SOLN
40.0000 meq | Freq: Once | INTRAVENOUS | Status: AC
  Administered 2017-01-30: 40 meq via INTRAVENOUS
  Filled 2017-01-30: qty 9.09

## 2017-01-30 MED ORDER — CLOPIDOGREL BISULFATE 75 MG PO TABS
75.0000 mg | ORAL_TABLET | Freq: Every day | ORAL | Status: DC
  Administered 2017-01-31 – 2017-02-11 (×11): 75 mg via ORAL
  Filled 2017-01-30 (×11): qty 1
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
## 2017-01-30 NOTE — Progress Notes (Signed)
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 carotid artery, the  right subclavian artery, the left common carotid artery. FINDINGS: The right vertebral artery origin is widely patent. The vessel is seen to opacify to the cranial skull base. Wide patency is seen of the right vertebrobasilar junction and the right posterior-inferior cerebellar artery. There is suggestion of a right anterior-inferior cerebellar artery/posterior inferior cerebellar artery complex. Flow is noted into the basilar artery to the level of the superior cerebellar arteries. Non-opacified blood is seen in the basilar artery from the contralateral vertebral artery. The right common carotid arteriogram demonstrates the right external carotid artery and its major branches to be widely patent. The right internal carotid artery at the bulb to the cranial skull base opacifies normally. The petrous segment appears widely patent. There is mild fusiform prominence of the petrous cavernous junction. The distal cavernous and the supraclinoid segments are widely patent. A right posterior communicating artery is seen opacifying the right posterior cerebral and the left posterior cerebral artery distributions. Flash filling of the distal basilar artery is noted. The right middle cerebral artery and the right anterior cerebral artery opacify into the capillary and venous phases. There is flash opacification via the anterior communicating artery of the left anterior cerebral artery A2 segment. The delayed images demonstrate stasis of contrast in the left internal carotid artery cavernous segment suggestive of the proximal occlusion of the left internal carotid artery. The left common carotid arteriogram demonstrates truncated left external carotid artery distal to the origin of the facial artery. The left facial artery and the left lingual artery are patent. Also patent is the left occipital artery and the left ascending pharyngeal artery. 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 There is no distal reconstitution of the left internal carotid artery from the external  carotid artery branches. ENDOVASCULAR COMPLETE REVASCULARIZATION OF OCCLUDED LEFT INTERNAL CAROTID ARTERY AND OF THE LEFT MIDDLE AND LEFT ANTERIOR CEREBRAL ARTERY. The diagnostic JB 1 catheter in the left common carotid artery was exchanged over a 0.035 inch Rosen exchange guidewire for a 55 cm 8 French Brite tip neurovascular sheath using biplane roadmap technique and constant fluoroscopic guidance. Good aspiration was obtained from the side port at the hub of the neurovascular sheath. This was then connected to continuous heparinized saline infusion. Over the [employer] guidewire, a 95 cm 8 [place] FlowGate balloon guide catheter which had been prepped with 50% contrast and 50% heparinized saline infusion was advanced and positioned just proximal to the left common carotid artery bifurcation. The guidewire was removed. Good aspiration was obtained from the hub of the [facility] guide catheter. A gentle contrast injection demonstrated no change in the extracranial circulation. Faint contrast was noted in the bulb region of the left internal carotid artery suggestive of chunks of clot. A rapid transit 2 tip microcatheter was then advanced over a 0.014 inch Softip Synchro micro guidewire to the distal end of the FlowGate guide catheter. Using biplane roadmap technique and constant fluoroscopic guidance, and a torque device, the micro guidewire was gently manipulated through the occluded left internal carotid artery and advanced to the distal vertical segment followed by the microcatheter. The guidewire was removed. Good aspiration was obtained from the hub of the microcatheter which was now in the petrous cavernous junction. A gentle control arteriogram demonstrated wide patency of the cavernous and supraclinoid segments. 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 Contrast was noted in the left anterior cerebral artery distribution. Using biplane roadmap technique and constant fluoroscopic guidance, in a coaxial manner and with constant heparinized saline infusion, over a 0.014 inch Softip Synchro micro guidewire, the rapid transit microcatheter was advanced to the supraclinoid left ICA. With the micro guidewire leading with a J-tip configuration to avoid dissections or inducing spasm, it was advanced into the occluded left middle cerebral artery inferior division M2 M3 region followed by the microcatheter. The guidewire was removed. Good aspiration was obtained from the hub of the microcatheter. A 4 mm x 40 mm Solitaire FR retrieval device was then advanced to the distal end of the microcatheter. The O ring on the delivery microcatheter was then loosened. With slight forward gentle traction with the right hand on the delivery micro guidewire, with the left hand the delivery microcatheter was retrieved unsheathing the entire retrieval device. The [facility] guide catheter was advanced into the bulb region of the left internal carotid artery. This was then inflated for proximal flow arrest. The proximal portion of the retrieval device was then captured into the microcatheter. Thereafter as continuous aspiration was applied with a 60 mL syringe at the side port of the [employer] [place] at the hub of the [facility] guide catheter, the combination was retrieved and removed slowly. Aspiration was continued as the balloon was deflated in the left internal carotid artery at the bulb. The tip of the FlowGate guide catheter was retrieved to just proximal to the origin of the left internal carotid artery. Free aspiration of blood was present at the hub of the [facility] guide catheter. Large chunks of clot were noted in the hub of the [place] and also noted to be in the aspirate. A control arteriogram performed through the [facility] guide catheter in the left  internal carotid artery proximally demonstrated brisk flow  through the left internal carotid artery and intracranially into the now completely patent petrous cavernous and supraclinoid segments. Also noted was free flow in the left middle cerebral artery distribution and also in the left anterior cerebral artery distribution. Also noted were large filling defects noted in the proximal 2/3 of the left internal carotid artery. These represented chunks of clot associated with intimal flaps consistent with dissection. At this time the rapid transit catheter was again advanced over a 0.014 inch Softip Synchro micro guidewire to the distal end of the FlowGate guide catheter which was now positioned inside the left internal carotid artery bulb. The wire was gently manipulated with a torque device without much resistance and advanced into the petrous portion of the left internal carotid artery followed by the microcatheter. The guidewire was removed. Good aspiration was obtained from the hub of the microcatheter. A gentle contrast injection continued to demonstrate brisk antegrade flow intracranially. At this time, a 6 mm x 40 mm Solitaire FR retrieval device was advanced to the distal end of the microcatheter. This was then deployed over the areas of the filling defect and just distally. With proximal flow arrest achieved by inflating the balloon in the left internal carotid artery at the bulb of the FlowGate guide catheter, the combination of the retrieval device and the microcatheter were gently retrieved and removed. A few chunks of clot were noted in the aspirate. A control arteriogram performed through the [facility] guide catheter after deflating the balloon in the left common carotid artery demonstrated brisk flow into the left internal carotid artery proximally and distally. There continued to be extensive filling defects noted due to the dissection which extended from the carotid bulb to the middle 1/3 of the left  internal carotid artery. Measurements were performed of the left internal carotid artery distally and also proximally of the left common carotid artery. Measurements were also performed of the length of the dissection of the left internal carotid artery. The rapid transit microcatheter was then again advanced over the 0.014 inch Softip Synchro micro guidewire again using biplane roadmap technique and constant fluoroscopic guidance to the distal left internal carotid artery in the petrous segment. The guidewire was removed. The rapid transit microcatheter was exchanged for a 0.014 inch Softip Transcend EX 300 cm exchange micro guidewire using biplane roadmap technique and constant fluoroscopic guidance. A control arteriogram was again performed following the retrieval of the microcatheter which demonstrated no change in the intracranial or extracranial circulation. At this time, a 8-6 mm x 40 mm carotid stent system which had been purged with heparinized saline infusion retrogradely was advanced using the rapid exchange technique to the distal end of the FlowGate guide catheter. This was then advanced such that the distal stent marker covered the most distal portion of the flap. Once positioned appropriately, the stent was then deployed without difficulty. With the wire being maintained in the distal cervical petrous junction, the delivery microcatheter of the stent was removed. A control arteriogram performed through the 8 [place] FlowGate guide catheter demonstrated excellent flow through the stented segment of the now tacked dissected flap. The most proximal portion of the flap which extended into the left carotid bulb remained untreated. This prompted the advancement of a 9/7 mm x 30 mm stent system again using the rapid exchange technique. The distal marker on the stent was positioned above the proximal portion of the first stent. Once appropriately positioned, the stent was then deployed without difficulty.  Following deployment, the delivery catheter  of the stent was retrieved and removed. The distal portion of the exchange micro guidewire remained stable with a J configuration in the horizontal petrous segment. A control arteriogram performed through the 8 [place] FlowGate guide catheter demonstrated excellent apposition and flow through the reconstructed left internal carotid artery in its proximal 2/3. No evidence of flap or filling defects were seen extra cranially or intracranially. Control arteriograms were then performed at 15,30 and 40 minutes post deployment of the second stent. These continued to demonstrate excellent flow through the stent. No filling defects were seen. There was slow flow noted in the left external carotid artery to be expected. Intracranially there was now improved caliber of the left middle cerebral artery and the left anterior cerebral artery distributions. The left posterior communicating artery also remained patent. During the procedure, the patient was given a total of 5.4 mg of super selective intracranial intra-arterial Integrelin. Also prior to the positioning for reconstruction of the dissection, the patient was loaded with a 350 mg of aspirin and 300 mg of Plavix via an orogastric tube. The patient's neurological and hemodynamic status remained stable throughout the procedure. The 8 [place] FlowGate guide catheter and the 8 [place] Brite tip neurovascular sheath were then retrieved into the abdominal aorta and exchanged over a J-tip guidewire for a 9 [place] Pinnacle sheath. This was then connected to continuous heparinized saline infusion. The patient's distal pulses bilaterally remained present and unchanged compared to prior to the procedure. The right groin puncture site was soft without evidence of a hematoma. IMPRESSION: Status post endovascular complete revascularization of occluded left middle cerebral artery with 1 pass with the Solitaire FR 4 mm x 40 mm retrieval device  achieving a TICI 3 reperfusion. Status post complete revascularization of occluded left internal carotid artery proximally and distally with reconstruction of the dissected proximal half of the left internal carotid artery using telescoping XACT stents as described above. A total of 5.4 mg of super selective intracranial intra-arterial Integrelin also used. PLAN: Patient to CT suite for postprocedural CT scan of the brain. Electronically Signed   By: Luanne Bras M.D.   On: 01/28/2017 14:27    Labs:  CBC:  Recent Labs  01/28/17 0107 01/28/17 0832 01/29/17 0445 01/29/17 1714 01/30/17 0500  WBC 12.0*  --  8.9 9.5 7.0  HGB 13.2 10.2* 7.8* 8.7* 6.4*  HCT 40.1 30.0* 23.5* 26.0* 19.2*  PLT 214  --  145* 135* 121*    COAGS:  Recent Labs  01/27/17 1910  INR 1.10    BMP:  Recent Labs  01/27/17 1910 01/28/17 0107 01/28/17 0832  01/29/17 0445 01/29/17 1714 01/29/17 2245 01/30/17 0500  NA 139 137 139  < > 146* 153* 156* 155*  K 3.4* 3.9 4.1  --  3.8  --   --  3.0*  CL 104 102  --   --  121*  --   --  >130*  CO2 25 22  --   --  21*  --   --  22  GLUCOSE 122* 140*  --   --  171*  --   --  167*  BUN <5* <5*  --   --  8  --   --  11  CALCIUM 8.7* 8.7*  --   --  7.2*  --   --  7.1*  CREATININE 0.92 0.74  --   --  0.77  --   --  0.74  GFRNONAA >60 >60  --   --  >  60  --   --  >60  GFRAA >60 >60  --   --  >60  --   --  >60  < > = values in this interval not displayed.  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

## 2017-01-30 NOTE — Progress Notes (Signed)
Follow up - Trauma and Critical Care  Patient Details:    Arthur Beasley is an 36 y.o. male.  Lines/tubes : Airway 8 mm (Active)  Secured at (cm) 22 cm 01/30/2017  2:56 AM  Measured From Lips 01/30/2017  2:56 AM  Secured Location Left 01/30/2017  2:56 AM  Secured By [employer] 01/30/2017  2:56 AM  Tube Holder Repositioned Yes 01/30/2017  2:56 AM  Cuff Pressure (cm H2O) 24 cm H2O 01/30/2017  2:56 AM  Site Condition Dry 01/30/2017  2:56 AM     PICC Double Lumen 01/29/17 PICC Right Brachial 36 cm 0 cm (Active)  Indication for Insertion or Continuance of Line Prolonged intravenous therapies 01/30/2017  7:51 AM  Exposed Catheter (cm) 0 cm 01/29/2017  8:00 PM  Dressing Change Due 02/05/17 01/29/2017  8:00 PM     Arterial Line 01/28/17 Left Radial (Active)  Site Assessment Clean;Dry;Intact 01/29/2017  8:00 PM  Line Status Pulsatile blood flow 01/29/2017  8:00 PM  Art Line Waveform Appropriate 01/29/2017  8:00 PM  Art Line Interventions Zeroed and calibrated 01/29/2017  8:00 PM  Color/Movement/Sensation Capillary refill less than 3 sec 01/29/2017  8:00 PM  Dressing Type Transparent 01/29/2017  8:00 PM  Dressing Status Clean;Dry;Intact 01/29/2017  8:00 PM     NG/OG Tube Orogastric 16 Fr. Center mouth Xray Measured external length of tube (Active)  Site Assessment Clean;Dry;Intact 01/29/2017  8:00 PM  Ongoing Placement Verification No acute changes, not attributed to clinical condition 01/29/2017  8:00 PM  Status Suction-low intermittent 01/29/2017  8:00 PM  Drainage Appearance Owens Shark 01/29/2017  8:00 PM     Urethral Catheter [name] RN Non-latex 16 Fr. (Active)  Indication for Insertion or Continuance of Catheter Other (comment) 01/30/2017  7:45 AM  Site Assessment Clean;Intact 01/29/2017  8:00 PM  Catheter Maintenance Bag below level of bladder;Drainage bag/tubing not touching floor;Insertion date on drainage bag;No dependent loops;Catheter secured;Seal intact 01/29/2017  8:00 PM   Collection Container Standard drainage bag 01/29/2017  8:00 PM  Securement Method Securing device (Describe) 01/29/2017  8:00 PM  Urinary Catheter Interventions Unclamped 01/29/2017  8:00 PM  Output (mL) 100 mL 01/30/2017  6:00 AM    Microbiology/Sepsis markers: Results for orders placed or performed during the hospital encounter of 01/27/17  MRSA PCR Screening     Status: None   Collection Time: 01/28/17 12:24 AM  Result Value Ref Range Status   MRSA by PCR NEGATIVE NEGATIVE Final    Comment:        The GeneXpert MRSA Assay (FDA approved for NASAL specimens only), is one component of a comprehensive MRSA colonization surveillance program. It is not intended to diagnose MRSA infection nor to guide or monitor treatment for MRSA infections.     Anti-infectives:  Anti-infectives    Start     Dose/Rate Route Frequency Ordered Stop   01/27/17 2200  clindamycin (CLEOCIN) IVPB 300 mg     300 mg 100 mL/hr over 30 Minutes Intravenous Every 6 hours 01/27/17 2127        Best Practice/Protocols:  VTE Prophylaxis: Mechanical GI Prophylaxis: Proton Pump Inhibitor Continous Sedation  Consults: Treatment Team:  Trauma Md, MD Angelia Mould, MD Ashok Pall, MD    Events:  Subjective:    Overnight Issues: Patient had a decompressive craniectomy yesterday.  About 1/3 of his calvarium was removed.  Based on CT this AM it seems to have decompressed the patient well.  Objective:  Vital signs for last 24  hours: Temp:  [98.1 F (36.7 C)-99.2 F (37.3 C)] 99.2 F (37.3 C) (03/16 0800) Pulse Rate:  [55-98] 71 (03/16 0800) Resp:  [12-19] 13 (03/16 0800) BP: (98-157)/(57-117) 129/74 (03/16 0800) SpO2:  [100 %] 100 % (03/16 0800) Arterial Line BP: (106-192)/(59-108) 154/72 (03/16 0800) FiO2 (%):  [40 %] 40 % (03/16 0800) Weight:  [54.2 kg (119 lb 7.8 oz)] 54.2 kg (119 lb 7.8 oz) (03/16 0441)  Hemodynamic parameters for last 24 hours:    Intake/Output from previous  day: 03/15 0701 - 03/16 0700 In: 3897.4 [I.V.:2348; Blood:335; NG/GT:400.3; IV Piggyback:814.1] Out: 3825 [Urine:3525; Blood:300]  Intake/Output this shift: Total I/O In: 30 [Blood:30] Out: -   Vent settings for last 24 hours: Vent Mode: PRVC FiO2 (%):  [40 %] 40 % Set Rate:  [13 bmp] 13 bmp Vt Set:  [530 mL] 530 mL PEEP:  [5 cmH20] 5 cmH20 Plateau Pressure:  [12 cmH20-14 cmH20] 14 cmH20  Physical Exam:  General: no respiratory distress Neuro: RASS -2, weakness right upper extremity and weakness right lower extremity Resp: clear to auscultation bilaterally and CXR without infiltrates or consolidation CVS: regular rate and rhythm, S1, S2 normal, no murmur, click, rub or gallop GI: soft, nontender, BS WNL, no r/g and tolerating tube feedings well. Extremities: no edema, no erythema, pulses WNL  Results for orders placed or performed during the hospital encounter of 01/27/17 (from the past 24 hour(s))  Prepare RBC     Status: None   Collection Time: 01/29/17 11:49 AM  Result Value Ref Range   Order Confirmation ORDER PROCESSED BY BLOOD BANK   Triglycerides     Status: None   Collection Time: 01/29/17  1:20 PM  Result Value Ref Range   Triglycerides 92 <150 mg/dL  Glucose, capillary     Status: Abnormal   Collection Time: 01/29/17  3:25 PM  Result Value Ref Range   Glucose-Capillary 125 (H) 65 - 99 mg/dL   Comment 1 Notify RN    Comment 2 Document in Chart   Sodium     Status: Abnormal   Collection Time: 01/29/17  5:14 PM  Result Value Ref Range   Sodium 153 (H) 135 - 145 mmol/L  Magnesium     Status: None   Collection Time: 01/29/17  5:14 PM  Result Value Ref Range   Magnesium 1.9 1.7 - 2.4 mg/dL  Phosphorus     Status: Abnormal   Collection Time: 01/29/17  5:14 PM  Result Value Ref Range   Phosphorus <1.0 (LL) 2.5 - 4.6 mg/dL  CBC     Status: Abnormal   Collection Time: 01/29/17  5:14 PM  Result Value Ref Range   WBC 9.5 4.0 - 10.5 K/uL   RBC 2.88 (L) 4.22 - 5.81  MIL/uL   Hemoglobin 8.7 (L) 13.0 - 17.0 g/dL   HCT 26.0 (L) 39.0 - 52.0 %   MCV 90.3 78.0 - 100.0 fL   MCH 30.2 26.0 - 34.0 pg   MCHC 33.5 30.0 - 36.0 g/dL   RDW 14.2 11.5 - 15.5 %   Platelets 135 (L) 150 - 400 K/uL  Glucose, capillary     Status: Abnormal   Collection Time: 01/29/17  7:22 PM  Result Value Ref Range   Glucose-Capillary 147 (H) 65 - 99 mg/dL  Sodium     Status: Abnormal   Collection Time: 01/29/17 10:45 PM  Result Value Ref Range   Sodium 156 (H) 135 - 145 mmol/L  Glucose, capillary  Status: Abnormal   Collection Time: 01/29/17 11:18 PM  Result Value Ref Range   Glucose-Capillary 141 (H) 65 - 99 mg/dL  Blood gas, arterial     Status: Abnormal (Preliminary result)   Collection Time: 01/30/17  3:10 AM  Result Value Ref Range   FIO2 40.00    Delivery systems VENTILATOR    Mode PRESSURE REGULATED VOLUME CONTROL    LHR 13 resp/min   Peep/cpap 5.0 cm H20   pH, Arterial 7.420 7.350 - 7.450   pCO2 arterial 34.3 32.0 - 48.0 mmHg   pO2, Arterial 183 (H) 83.0 - 108.0 mmHg   Bicarbonate 21.9 20.0 - 28.0 mmol/L   Acid-base deficit 1.9 0.0 - 2.0 mmol/L   O2 Saturation 99.4 %   Patient temperature 98.6    Collection site LEFT RADIAL    Drawn by [id number]    Sample type PASS    Allens test (pass/fail) PENDING PASS  Glucose, capillary     Status: Abnormal   Collection Time: 01/30/17  3:19 AM  Result Value Ref Range   Glucose-Capillary 133 (H) 65 - 99 mg/dL  Magnesium     Status: None   Collection Time: 01/30/17  5:00 AM  Result Value Ref Range   Magnesium 2.0 1.7 - 2.4 mg/dL  Phosphorus     Status: Abnormal   Collection Time: 01/30/17  5:00 AM  Result Value Ref Range   Phosphorus 1.4 (L) 2.5 - 4.6 mg/dL  CBC     Status: Abnormal   Collection Time: 01/30/17  5:00 AM  Result Value Ref Range   WBC 7.0 4.0 - 10.5 K/uL   RBC 2.14 (L) 4.22 - 5.81 MIL/uL   Hemoglobin 6.4 (LL) 13.0 - 17.0 g/dL   HCT 19.2 (L) 39.0 - 52.0 %   MCV 89.7 78.0 - 100.0 fL   MCH 29.9 26.0 -  34.0 pg   MCHC 33.3 30.0 - 36.0 g/dL   RDW 14.8 11.5 - 15.5 %   Platelets 121 (L) 150 - 400 K/uL  Basic metabolic panel     Status: Abnormal   Collection Time: 01/30/17  5:00 AM  Result Value Ref Range   Sodium 155 (H) 135 - 145 mmol/L   Potassium 3.0 (L) 3.5 - 5.1 mmol/L   Chloride >130 (HH) 101 - 111 mmol/L   CO2 22 22 - 32 mmol/L   Glucose, Bld 167 (H) 65 - 99 mg/dL   BUN 11 6 - 20 mg/dL   Creatinine, Ser 0.74 0.61 - 1.24 mg/dL   Calcium 7.1 (L) 8.9 - 10.3 mg/dL   GFR calc non Af Amer >60 >60 mL/min   GFR calc Af Amer >60 >60 mL/min  Prepare RBC     Status: None   Collection Time: 01/30/17  6:14 AM  Result Value Ref Range   Order Confirmation ORDER PROCESSED BY BLOOD BANK   Glucose, capillary     Status: Abnormal   Collection Time: 01/30/17  7:45 AM  Result Value Ref Range   Glucose-Capillary 135 (H) 65 - 99 mg/dL     Assessment/Plan:   NEURO  Altered Mental Status:  sedation Trauma-CNS:  coma, increased intracranial pressure and Patient with decompressive craniectomy for reperfusion edema.   Plan: CPM.  On hypertonic saline.    PULM  No specific issues   Plan: CPM  CARDIO  No issues   Plan: CPM  RENAL  Urine output is good butnot too much   Plan: CPM  GI  No specific  issues.   Plan: Increase TFs to goal  ID  No known infectious coucerns   Plan: CPM  HEME  Anemia acute blood loss anemia)   Plan: Getting one unit of blood for HGB 6.3.  Will recheck later  ENDO Hypokalemia (moderate (2.8 - 3.5 meq/dl)), Hypernatremia (moderate (146 - 155 meq/dl)), Hyperchloremia Cl > 130 from hypertonic NaCl and Hypophosphatemia Replaced K+ and Phosphate   Plan: Replace electrolytes  Global Issues  Patient had decompressive craniectomy yesterday and is recovering.  There is  Minimal shift today.  Lots of edema on the left side.  Not trying to wean at all today on in the near future.  Will check ABG and DC arterial l ine.    LOS: 3 days   Additional comments:I reviewed the  patient's new clinical lab test results. cbc/bmet and I reviewed the patients new imaging test results. cxr  Critical Care Total Time*: 30 Minutes  Guinevere Stephenson 01/30/2017  *Care during the described time interval was provided by me and/or other providers on the critical care team.  I have reviewed this patient's available data, including medical history, events of note, physical examination and test results as part of my evaluation.

## 2017-01-30 NOTE — Progress Notes (Signed)
CRITICAL VALUE ALERT  Critical value received:  HgB 6.4, Cl >130  Date of notification:  01/31/15  Time of notification: 0515  Critical value read back: Yes  Nurse who received alert: Denyse Amass  MD notified (1st page): Greer Pickerel  Time of first page:  0605  Responding MD:  Greer Pickerel  Time MD responded: [phone]
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
## 2017-01-31 LAB — BASIC METABOLIC PANEL
Anion gap: 5 (ref 5–15)
Anion gap: 4 — ABNORMAL LOW (ref 5–15)
BUN: 16 mg/dL (ref 6–20)
BUN: 16 mg/dL (ref 6–20)
CALCIUM: 7.6 mg/dL — AB (ref 8.9–10.3)
CO2: 25 mmol/L (ref 22–32)
CO2: 25 mmol/L (ref 22–32)
Calcium: 7.6 mg/dL — ABNORMAL LOW (ref 8.9–10.3)
Chloride: 130 mmol/L — ABNORMAL HIGH (ref 101–111)
Chloride: 130 mmol/L — ABNORMAL HIGH (ref 101–111)
Creatinine, Ser: 0.71 mg/dL (ref 0.61–1.24)
Creatinine, Ser: 0.7 mg/dL (ref 0.61–1.24)
GFR calc Af Amer: >60 mL/min (ref >60)
Glucose, Bld: 191 mg/dL — ABNORMAL HIGH (ref 65–99)
Glucose, Bld: 142 mg/dL — ABNORMAL HIGH (ref 65–99)
POTASSIUM: 2.9 mmol/L — AB (ref 3.5–5.1)
POTASSIUM: 2.8 mmol/L — AB (ref 3.5–5.1)
SODIUM: 160 mmol/L — AB (ref 135–145)
SODIUM: 159 mmol/L — AB (ref 135–145)

## 2017-01-31 LAB — CBC WITH DIFFERENTIAL/PLATELET
BASOS PCT: 0 %
Basophils Absolute: 0 10*3/uL (ref 0.0–0.1)
EOS ABS: 0 10*3/uL (ref 0.0–0.7)
EOS PCT: 0 %
HCT: 22.7 % — ABNORMAL LOW (ref 39.0–52.0)
HEMOGLOBIN: 7.6 g/dL — AB (ref 13.0–17.0)
Lymphocytes Relative: 11 %
Lymphs Abs: 0.8 10*3/uL (ref 0.7–4.0)
MCH: 30 pg (ref 26.0–34.0)
MCHC: 33.5 g/dL (ref 30.0–36.0)
MCV: 89.7 fL (ref 78.0–100.0)
MONOS PCT: 10 %
Monocytes Absolute: 0.8 10*3/uL (ref 0.1–1.0)
NEUTROS PCT: 79 %
Neutro Abs: 6 10*3/uL (ref 1.7–7.7)
PLATELETS: 148 10*3/uL — AB (ref 150–400)
RBC: 2.53 MIL/uL — ABNORMAL LOW (ref 4.22–5.81)
RDW: 15.4 % (ref 11.5–15.5)
WBC: 7.7 10*3/uL (ref 4.0–10.5)

## 2017-01-31 LAB — BPAM RBC
BLOOD PRODUCT EXPIRATION DATE: 201804062359
Blood Product Expiration Date: 201803182359
Blood Product Expiration Date: 201804022359
Blood Product Expiration Date: 201804042359
ISSUE DATE / TIME: 201803131801
ISSUE DATE / TIME: 201803151155
ISSUE DATE / TIME: 201803160639
ISSUE DATE / TIME: 201803161752
UNIT TYPE AND RH: 5100
Unit Type and Rh: 5100
Unit Type and Rh: 9500
Unit Type and Rh: 9500

## 2017-01-31 LAB — TYPE AND SCREEN
ABO/RH(D): O POS
ANTIBODY SCREEN: NEGATIVE
UNIT DIVISION: 0
UNIT DIVISION: 0
Unit division: 0
Unit division: 0

## 2017-01-31 LAB — CBC
HEMATOCRIT: 22.5 % — AB (ref 39.0–52.0)
Hemoglobin: 7.5 g/dL — ABNORMAL LOW (ref 13.0–17.0)
MCH: 30 pg (ref 26.0–34.0)
MCHC: 33.3 g/dL (ref 30.0–36.0)
MCV: 90 fL (ref 78.0–100.0)
Platelets: 163 10*3/uL (ref 150–400)
RBC: 2.5 MIL/uL — ABNORMAL LOW (ref 4.22–5.81)
RDW: 15.5 % (ref 11.5–15.5)
WBC: 8.1 10*3/uL (ref 4.0–10.5)

## 2017-01-31 LAB — BLOOD GAS, ARTERIAL
ACID-BASE DEFICIT: 0.3 mmol/L (ref 0.0–2.0)
Bicarbonate: 23.9 mmol/L (ref 20.0–28.0)
Drawn by: 23604
FIO2: 30
O2 SAT: 98.3 %
PEEP/CPAP: 5 cmH2O
PH ART: 7.396 (ref 7.350–7.450)
Patient temperature: 98.6
RATE: 13 resp/min
VT: 530 mL
pCO2 arterial: 39.9 mmHg (ref 32.0–48.0)
pO2, Arterial: 110 mmHg — ABNORMAL HIGH (ref 83.0–108.0)

## 2017-01-31 LAB — PHOSPHORUS
Phosphorus: <1 mg/dL — CL (ref 2.5–4.6)
Phosphorus: 1.3 mg/dL — ABNORMAL LOW (ref 2.5–4.6)

## 2017-01-31 LAB — GLUCOSE, CAPILLARY
GLUCOSE-CAPILLARY: 135 mg/dL — AB (ref 65–99)
GLUCOSE-CAPILLARY: 133 mg/dL — AB (ref 65–99)
GLUCOSE-CAPILLARY: 146 mg/dL — AB (ref 65–99)
Glucose-Capillary: 118 mg/dL — ABNORMAL HIGH (ref 65–99)
Glucose-Capillary: 129 mg/dL — ABNORMAL HIGH (ref 65–99)

## 2017-01-31 LAB — SODIUM
SODIUM: 163 mmol/L — AB (ref 135–145)
Sodium: 154 mmol/L — ABNORMAL HIGH (ref 135–145)

## 2017-01-31 MED ORDER — SODIUM CHLORIDE 0.9 % IV SOLN
INTRAVENOUS | Status: DC
  Administered 2017-01-31: 13:00:00 via INTRAVENOUS
  Administered 2017-02-01: 1000 mL via INTRAVENOUS
  Administered 2017-02-02 – 2017-02-13 (×13): via INTRAVENOUS

## 2017-01-31 MED ORDER — POTASSIUM PHOSPHATES 15 MMOLE/5ML IV SOLN
30.0000 mmol | Freq: Once | INTRAVENOUS | Status: AC
  Administered 2017-01-31: 30 mmol via INTRAVENOUS
  Filled 2017-01-31: qty 10

## 2017-01-31 MED ORDER — LABETALOL HCL 5 MG/ML IV SOLN
10.0000 mg | INTRAVENOUS | Status: DC | PRN
  Administered 2017-01-31 (×2): 10 mg via INTRAVENOUS
  Filled 2017-01-31: qty 4

## 2017-01-31 NOTE — Progress Notes (Signed)
CRITICAL VALUE ALERT  Critical value received:  Na+  Date of notification:  01/31/2017  Time of notification:  4920  Critical value read back:Yes.    Nurse who received alert:  Tharon Aquas, RN  MD notified (1st page):  Dr. Belenda Cruise  Time of first page:  1113  MD notified (2nd page):  Time of second page:  Responding MD:  Dr. Belenda Cruise  Time MD responded: 1113

## 2017-01-31 NOTE — Progress Notes (Signed)
Patient ID: Arthur Beasley, male   DOB: 1980/12/16, 36 y.o.   MRN: [id number]  BP 135/84   Pulse 84   Temp 98.4 F (36.9 C) (Axillary)   Resp 13   Ht 5\' 7"  (1.702 m)   Wt 54.2 kg (119 lb 7.8 oz)   SpO2 100%   BMI 18.71 kg/m  Sedated, on ventilator Some occasional  movements on left side.  perrl, +cough, +gag,+corneals Dressing is dry CT significantly better with regards to shift. Infarct is present.  No new recommendations, prognosis remains poor

## 2017-01-31 NOTE — Progress Notes (Signed)
Follow up - Trauma and Critical Care  Patient Details:    Arthur Beasley is an 36 y.o. male.  Lines/tubes : Airway 8 mm (Active)  Secured at (cm) 22 cm 01/31/2017  7:07 AM  Measured From Lips 01/31/2017  7:07 AM  Secured Location Right 01/31/2017  7:07 AM  Secured By [employer] 01/31/2017  7:07 AM  Tube Holder Repositioned Yes 01/31/2017  7:07 AM  Cuff Pressure (cm H2O) 24 cm H2O 01/30/2017  3:57 PM  Site Condition Dry 01/31/2017  7:07 AM     PICC Double Lumen 01/29/17 PICC Right Brachial 36 cm 0 cm (Active)  Indication for Insertion or Continuance of Line Administration of hyperosmolar/irritating solutions (i.e. TPN, Vancomycin, etc.) 01/31/2017  8:00 AM  Exposed Catheter (cm) 0 cm 01/29/2017  8:00 PM  Site Assessment Clean;Dry;Intact 01/31/2017  9:00 AM  Lumen #1 Status Infusing;Flushed;Blood return noted 01/31/2017  9:00 AM  Lumen #2 Status Infusing;Flushed;Blood return noted 01/31/2017  9:00 AM  Dressing Type Transparent 01/31/2017  9:00 AM  Dressing Status Clean;Dry;Intact;Antimicrobial disc in place 01/31/2017  9:00 AM  Dressing Change Due 02/05/17 01/31/2017  9:00 AM     NG/OG Tube Orogastric 16 Fr. Center mouth Xray Measured external length of tube (Active)  Site Assessment Clean;Intact 01/31/2017  8:00 AM  Ongoing Placement Verification No change in cm markings or external length of tube from initial placement;No change in respiratory status;No acute changes, not attributed to clinical condition 01/31/2017  8:00 AM  Status Infusing tube feed 01/31/2017  8:00 AM  Drainage Appearance Owens Shark 01/29/2017  8:00 PM     Urethral Catheter [name] RN Non-latex 16 Fr. (Active)  Indication for Insertion or Continuance of Catheter Aggressive IV diuresis 01/31/2017  7:45 AM  Site Assessment Clean;Intact;Dry 01/31/2017  8:00 AM  Catheter Maintenance Bag below level of bladder;Catheter secured;Drainage bag/tubing not touching floor;Insertion date on drainage bag;No dependent loops  01/31/2017  7:45 AM  Collection Container Standard drainage bag 01/31/2017  8:00 AM  Securement Method Securing device (Describe) 01/31/2017  8:00 AM  Urinary Catheter Interventions Unclamped 01/30/2017  7:15 PM  Output (mL) 650 mL 01/31/2017  5:00 AM    Microbiology/Sepsis markers: Results for orders placed or performed during the hospital encounter of 01/27/17  MRSA PCR Screening     Status: None   Collection Time: 01/28/17 12:24 AM  Result Value Ref Range Status   MRSA by PCR NEGATIVE NEGATIVE Final    Comment:        The GeneXpert MRSA Assay (FDA approved for NASAL specimens only), is one component of a comprehensive MRSA colonization surveillance program. It is not intended to diagnose MRSA infection nor to guide or monitor treatment for MRSA infections.     Anti-infectives:  Anti-infectives    Start     Dose/Rate Route Frequency Ordered Stop   01/27/17 2200  clindamycin (CLEOCIN) IVPB 300 mg     300 mg 100 mL/hr over 30 Minutes Intravenous Every 6 hours 01/27/17 2127        Best Practice/Protocols:  VTE Prophylaxis: Lovenox (prophylaxtic dose) and Mechanical GI Prophylaxis: Proton Pump Inhibitor Continous Sedation Precedex and fentanyl  Consults: Treatment Team:  Trauma Md, MD Angelia Mould, MD Ashok Pall, MD    Events:  Subjective:    Overnight Issues: Patient doing well on weaning this AM.  Spiked some fevers.  Objective:  Vital signs for last 24 hours: Temp:  [98.4 F (36.9 C)-101.6 F (38.7 C)] 101.6 F (38.7 C) (03/17  0800) Pulse Rate:  [70-99] 90 (03/17 1143) Resp:  [11-17] 13 (03/17 1143) BP: (127-160)/(70-98) 143/86 (03/17 1143) SpO2:  [100 %] 100 % (03/17 1143) FiO2 (%):  [30 %-40 %] 30 % (03/17 1143) Weight:  [55 kg (121 lb 4.1 oz)] 55 kg (121 lb 4.1 oz) (03/17 0418)  Hemodynamic parameters for last 24 hours:    Intake/Output from previous day: 03/16 0701 - 03/17 0700 In: 3002.9 [I.V.:1327.9; Blood:360; NG/GT:1115; IV  Piggyback:200] Out: 1600 [Urine:1600]  Intake/Output this shift: Total I/O In: 998.6 [I.V.:238.6; NG/GT:200; IV Piggyback:560] Out: -   Vent settings for last 24 hours: Vent Mode: PSV;CPAP FiO2 (%):  [30 %-40 %] 30 % Set Rate:  [13 bmp] 13 bmp Vt Set:  [530 mL] 530 mL PEEP:  [5 cmH20] 5 cmH20 Pressure Support:  [8 cmH20] 8 cmH20 Plateau Pressure:  [10 cmH20-15 cmH20] 15 cmH20  Physical Exam:  General: no respiratory distress Neuro: nonfocal exam, RASS 0, RASS -1, weakness right upper extremity, weakness right lower extremity and No movement on the right at all Resp: clear to auscultation bilaterally CVS: regular rate and rhythm, S1, S2 normal, no murmur, click, rub or gallop GI: soft, nontender, BS WNL, no r/g Extremities: no edema, no erythema, pulses WNL  Results for orders placed or performed during the hospital encounter of 01/27/17 (from the past 24 hour(s))  Glucose, capillary     Status: Abnormal   Collection Time: 01/30/17  3:37 PM  Result Value Ref Range   Glucose-Capillary 133 (H) 65 - 99 mg/dL  Sodium     Status: Abnormal   Collection Time: 01/30/17  4:00 PM  Result Value Ref Range   Sodium 158 (H) 135 - 145 mmol/L  Magnesium     Status: None   Collection Time: 01/30/17  5:00 PM  Result Value Ref Range   Magnesium 2.2 1.7 - 2.4 mg/dL  Phosphorus     Status: Abnormal   Collection Time: 01/30/17  5:00 PM  Result Value Ref Range   Phosphorus 1.6 (L) 2.5 - 4.6 mg/dL  Glucose, capillary     Status: Abnormal   Collection Time: 01/30/17  8:50 PM  Result Value Ref Range   Glucose-Capillary 130 (H) 65 - 99 mg/dL  Sodium     Status: Abnormal   Collection Time: 01/30/17 10:30 PM  Result Value Ref Range   Sodium 159 (H) 135 - 145 mmol/L  Glucose, capillary     Status: Abnormal   Collection Time: 01/30/17 11:38 PM  Result Value Ref Range   Glucose-Capillary 153 (H) 65 - 99 mg/dL  Glucose, capillary     Status: Abnormal   Collection Time: 01/31/17  3:28 AM  Result  Value Ref Range   Glucose-Capillary 118 (H) 65 - 99 mg/dL  Blood gas, arterial     Status: Abnormal   Collection Time: 01/31/17  4:20 AM  Result Value Ref Range   FIO2 30.00    Delivery systems VENTILATOR    Mode PRESSURE REGULATED VOLUME CONTROL    VT 530 mL   LHR 13 resp/min   Peep/cpap 5.0 cm H20   pH, Arterial 7.396 7.350 - 7.450   pCO2 arterial 39.9 32.0 - 48.0 mmHg   pO2, Arterial 110 (H) 83.0 - 108.0 mmHg   Bicarbonate 23.9 20.0 - 28.0 mmol/L   Acid-base deficit 0.3 0.0 - 2.0 mmol/L   O2 Saturation 98.3 %   Patient temperature 98.6    Collection site LEFT RADIAL    Drawn by  [id number]    Sample type ARTERIAL DRAW    Allens test (pass/fail) PASS PASS  CBC with Differential/Platelet     Status: Abnormal   Collection Time: 01/31/17  4:45 AM  Result Value Ref Range   WBC 7.7 4.0 - 10.5 K/uL   RBC 2.53 (L) 4.22 - 5.81 MIL/uL   Hemoglobin 7.6 (L) 13.0 - 17.0 g/dL   HCT 22.7 (L) 39.0 - 52.0 %   MCV 89.7 78.0 - 100.0 fL   MCH 30.0 26.0 - 34.0 pg   MCHC 33.5 30.0 - 36.0 g/dL   RDW 15.4 11.5 - 15.5 %   Platelets 148 (L) 150 - 400 K/uL   Neutrophils Relative % 79 %   Neutro Abs 6.0 1.7 - 7.7 K/uL   Lymphocytes Relative 11 %   Lymphs Abs 0.8 0.7 - 4.0 K/uL   Monocytes Relative 10 %   Monocytes Absolute 0.8 0.1 - 1.0 K/uL   Eosinophils Relative 0 %   Eosinophils Absolute 0.0 0.0 - 0.7 K/uL   Basophils Relative 0 %   Basophils Absolute 0.0 0.0 - 0.1 K/uL  Basic metabolic panel     Status: Abnormal   Collection Time: 01/31/17  4:45 AM  Result Value Ref Range   Sodium 160 (H) 135 - 145 mmol/L   Potassium 2.9 (L) 3.5 - 5.1 mmol/L   Chloride 130 (H) 101 - 111 mmol/L   CO2 25 22 - 32 mmol/L   Glucose, Bld 191 (H) 65 - 99 mg/dL   BUN 16 6 - 20 mg/dL   Creatinine, Ser 0.71 0.61 - 1.24 mg/dL   Calcium 7.6 (L) 8.9 - 10.3 mg/dL   GFR calc non Af Amer >60 >60 mL/min   GFR calc Af Amer >60 >60 mL/min   Anion gap 5 5 - 15  Phosphorus     Status: Abnormal   Collection Time: 01/31/17   4:58 AM  Result Value Ref Range   Phosphorus <1.0 (LL) 2.5 - 4.6 mg/dL  Glucose, capillary     Status: Abnormal   Collection Time: 01/31/17  7:34 AM  Result Value Ref Range   Glucose-Capillary 135 (H) 65 - 99 mg/dL  Sodium     Status: Abnormal   Collection Time: 01/31/17 10:27 AM  Result Value Ref Range   Sodium 163 (HH) 135 - 145 mmol/L  Glucose, capillary     Status: Abnormal   Collection Time: 01/31/17 11:37 AM  Result Value Ref Range   Glucose-Capillary 133 (H) 65 - 99 mg/dL     Assessment/Plan:   NEURO  Altered Mental Status:  sedation   Plan: Weaning sedation.  PULM  No known pulmonary issues.     Plan: Continue to wean on the ventilator  CARDIO  No specific issues   Plan: CPM  RENAL  Urine output and renal function good.   Plan: CPM  GI  No specific problems   Plan: Continue tube feedings.  ID  No known infectious sources   Plan: CPM  HEME  Anemia acute blood loss anemia)   Plan: Hemoglobin is low and will not neede t ransfusion at this tme.  ENDO Hypokalemia (moderate (2.8 - 3.5 meq/dl)), Hypernatremia (severe (>155 meq/dl)) and Hypochloremia   Plan: Stop hypertonic saline.  Replace KCl  Global Issues  Patient is weaning well. But will not extubated today.  Will back off on sedation to see what he is going to do    LOS: 4 days   Additional comments:I reviewed  the patient's new clinical lab test results. cbc?bmet  Critical Care Total Time*: 30 Minutes  Izrael Peak 01/31/2017  *Care during the described time interval was provided by me and/or other providers on the critical care team.  I have reviewed this patient's available data, including medical history, events of note, physical examination and test results as part of my evaluation.

## 2017-01-31 NOTE — Progress Notes (Signed)
CRITICAL VALUE ALERT  Critical value received:  Phosphate <1.0  Date of notification:  01/31/17  Time of notification:  0615  Critical value read back: yes  Nurse who received alert: Jamelle Haring   MD notified (1st page):  Ramierez  Time of first page: 0620  MD notified (2nd page):  Time of second page:  Responding MD:  Gevena Cotton   Time MD responded:  [phone]

## 2017-01-31 NOTE — Progress Notes (Signed)
Patient ID: Arthur Beasley, male   DOB: 07/02/1981, 36 y.o.   MRN: [id number] No significant change remained sedated on the ventilator  Pupils pinpoint  No Movement to noxious stim relation

## 2017-01-31 NOTE — Progress Notes (Signed)
Fentanyl off since 1210 and Precedex off since 1323. Pt is coughing hard and BP has not responded to hydralazine. Restarting fentanyl at 25 mcg with a 50 mcg bolus based on patient's signs of discomfort.
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
## 2017-02-01 ENCOUNTER — Inpatient Hospital Stay (HOSPITAL_COMMUNITY): Payer: Medicaid Other

## 2017-02-01 LAB — BASIC METABOLIC PANEL
ANION GAP: 4 — AB (ref 5–15)
BUN: 17 mg/dL (ref 6–20)
CALCIUM: 7.6 mg/dL — AB (ref 8.9–10.3)
CO2: 28 mmol/L (ref 22–32)
CREATININE: 0.77 mg/dL (ref 0.61–1.24)
Chloride: 126 mmol/L — ABNORMAL HIGH (ref 101–111)
GFR calc Af Amer: >60 mL/min (ref >60)
GFR calc non Af Amer: >60 mL/min (ref >60)
GLUCOSE: 127 mg/dL — AB (ref 65–99)
Potassium: 2.8 mmol/L — ABNORMAL LOW (ref 3.5–5.1)
Sodium: 158 mmol/L — ABNORMAL HIGH (ref 135–145)

## 2017-02-01 LAB — CBC WITH DIFFERENTIAL/PLATELET
Basophils Absolute: 0 K/uL (ref 0.0–0.1)
Basophils Relative: 0 %
Eosinophils Absolute: 0.1 K/uL (ref 0.0–0.7)
Eosinophils Relative: 1 %
HCT: 31.4 % — ABNORMAL LOW (ref 39.0–52.0)
Hemoglobin: 10.3 g/dL — ABNORMAL LOW (ref 13.0–17.0)
Lymphocytes Relative: 11 %
Lymphs Abs: 0.7 K/uL (ref 0.7–4.0)
MCH: 29.7 pg (ref 26.0–34.0)
MCHC: 32.8 g/dL (ref 30.0–36.0)
MCV: 90.5 fL (ref 78.0–100.0)
Monocytes Absolute: 0.7 K/uL (ref 0.1–1.0)
Monocytes Relative: 11 %
Neutro Abs: 4.8 K/uL (ref 1.7–7.7)
Neutrophils Relative %: 76 %
Platelets: 125 K/uL — ABNORMAL LOW (ref 150–400)
RBC: 3.47 MIL/uL — ABNORMAL LOW (ref 4.22–5.81)
RDW: 15.5 % (ref 11.5–15.5)
WBC: 6.3 K/uL (ref 4.0–10.5)

## 2017-02-01 LAB — SODIUM
Sodium: 153 mmol/L — ABNORMAL HIGH (ref 135–145)
Sodium: 152 mmol/L — ABNORMAL HIGH (ref 135–145)

## 2017-02-01 LAB — GLUCOSE, CAPILLARY
GLUCOSE-CAPILLARY: 155 mg/dL — AB (ref 65–99)
GLUCOSE-CAPILLARY: 117 mg/dL — AB (ref 65–99)
Glucose-Capillary: 130 mg/dL — ABNORMAL HIGH (ref 65–99)
Glucose-Capillary: 155 mg/dL — ABNORMAL HIGH (ref 65–99)
Glucose-Capillary: 140 mg/dL — ABNORMAL HIGH (ref 65–99)

## 2017-02-01 LAB — PHOSPHORUS: Phosphorus: 1.8 mg/dL — ABNORMAL LOW (ref 2.5–4.6)

## 2017-02-01 MED ORDER — AMLODIPINE 1 MG/ML ORAL SUSPENSION
5.0000 mg | Freq: Every day | ORAL | Status: DC
  Administered 2017-02-01 – 2017-02-04 (×4): 5 mg
  Filled 2017-02-01 (×9): qty 5

## 2017-02-01 MED ORDER — POTASSIUM PHOSPHATES 15 MMOLE/5ML IV SOLN
30.0000 mmol | Freq: Two times a day (BID) | INTRAVENOUS | Status: AC
  Administered 2017-02-01 (×2): 30 mmol via INTRAVENOUS
  Filled 2017-02-01 (×2): qty 10
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
## 2017-02-01 NOTE — Progress Notes (Signed)
Patient ID: Arthur Beasley, male   DOB: [birth year], 36 y.o.   MRN: [id number] Patient more awake and opens eyes tracks  Reportedly his follow commands although I do see movement in the left upper extremity not able to follow commands for me this morning. Flap is tense  Continue supportive care

## 2017-02-02 LAB — GLUCOSE, CAPILLARY
GLUCOSE-CAPILLARY: 125 mg/dL — AB (ref 65–99)
GLUCOSE-CAPILLARY: 153 mg/dL — AB (ref 65–99)
Glucose-Capillary: 140 mg/dL — ABNORMAL HIGH (ref 65–99)
Glucose-Capillary: 151 mg/dL — ABNORMAL HIGH (ref 65–99)
Glucose-Capillary: 135 mg/dL — ABNORMAL HIGH (ref 65–99)
Glucose-Capillary: 131 mg/dL — ABNORMAL HIGH (ref 65–99)
Glucose-Capillary: 142 mg/dL — ABNORMAL HIGH (ref 65–99)
Glucose-Capillary: 113 mg/dL — ABNORMAL HIGH (ref 65–99)

## 2017-02-02 LAB — BASIC METABOLIC PANEL
ANION GAP: 5 (ref 5–15)
Anion gap: 7 (ref 5–15)
BUN: 14 mg/dL (ref 6–20)
BUN: 13 mg/dL (ref 6–20)
CALCIUM: 7.7 mg/dL — AB (ref 8.9–10.3)
CO2: 29 mmol/L (ref 22–32)
CO2: 27 mmol/L (ref 22–32)
CREATININE: 0.63 mg/dL (ref 0.61–1.24)
CREATININE: 0.65 mg/dL (ref 0.61–1.24)
Calcium: 7.7 mg/dL — ABNORMAL LOW (ref 8.9–10.3)
Chloride: 117 mmol/L — ABNORMAL HIGH (ref 101–111)
Chloride: 112 mmol/L — ABNORMAL HIGH (ref 101–111)
GFR calc Af Amer: >60 mL/min (ref >60)
GFR calc Af Amer: >60 mL/min (ref >60)
GFR calc non Af Amer: >60 mL/min (ref >60)
GLUCOSE: 123 mg/dL — AB (ref 65–99)
GLUCOSE: 108 mg/dL — AB (ref 65–99)
Potassium: 2.9 mmol/L — ABNORMAL LOW (ref 3.5–5.1)
Potassium: 3.5 mmol/L (ref 3.5–5.1)
Sodium: 151 mmol/L — ABNORMAL HIGH (ref 135–145)
Sodium: 146 mmol/L — ABNORMAL HIGH (ref 135–145)

## 2017-02-02 LAB — SODIUM
SODIUM: 147 mmol/L — AB (ref 135–145)
SODIUM: 152 mmol/L — AB (ref 135–145)
SODIUM: 154 mmol/L — AB (ref 135–145)

## 2017-02-02 MED ORDER — ENOXAPARIN SODIUM 40 MG/0.4ML ~~LOC~~ SOLN
40.0000 mg | SUBCUTANEOUS | Status: DC
  Administered 2017-02-02 – 2017-02-11 (×9): 40 mg via SUBCUTANEOUS
  Filled 2017-02-02 (×9): qty 0.4

## 2017-02-02 MED ORDER — POTASSIUM CHLORIDE 20 MEQ/15ML (10%) PO SOLN
40.0000 meq | Freq: Two times a day (BID) | ORAL | Status: AC
  Administered 2017-02-02 (×2): 40 meq
  Filled 2017-02-02 (×2): qty 30

## 2017-02-02 MED ORDER — FREE WATER
200.0000 mL | Freq: Three times a day (TID) | Status: DC
  Administered 2017-02-02 – 2017-02-03 (×4): 200 mL
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
## 2017-02-02 NOTE — Progress Notes (Signed)
Patient ID: Arthur Beasley, male   DOB: 1981-09-21, 36 y.o.   MRN: [id number] BP (!) 147/88   Pulse 86   Temp 98.8 F (37.1 C) (Oral)   Resp 15   Ht 5\' 7"  (1.702 m)   Wt 58.5 kg (128 lb 15.5 oz)   SpO2 100%   BMI 20.20 kg/m  Arouses to mild stimulation,localizing briskly with left upper extremity No movement on right side Wound is clean, dry, no signs of infection Midline shift resolved, basal cisterns patent No new recs

## 2017-02-02 NOTE — Op Note (Signed)
01/27/2017 - 01/29/2017  6:49 PM  PATIENT:  Arthur Beasley  36 y.o. male with malignant cerebral edema secondary to a left MCA infarct. Family opted for a decompressive hemicraniectomy.  PRE-OPERATIVE DIAGNOSIS: left frontoparietal temporal Cerebral Edema,  Left ICA infarct  POST-OPERATIVE DIAGNOSIS:left frontoparietal temporal  Cerebral Edema, left ICA infarct  PROCEDURE:  Procedure(s): Left Hemi-Craniectomy for cerebral decompression  SURGEON: Surgeon(s): Ashok Pall, MD  ASSISTANTS:meyran, Maudie Mercury  ANESTHESIA:   general  EBL:  Total I/O In: 1837.3 [I.V.:887.3; NG/GT:950] Out: 1575 [HQPRF:1638]  BLOOD ADMINISTERED:none  COUNT:per nursing  DRAINS: none   SPECIMEN:  No Specimen  DICTATION: EUTIMIO GHARIBIAN was taken to the operating room,already  intubated, and placed under a general anesthetic without difficulty. He was positioned supine. Once adequate anesthesia was obtained I placed his head in a three pin mayfield head holder. I attached the head holder to the operating room table. His head was shaved and  prepped and draped in a sterile manner. I made a large reverse question mark incision on the left side. I reflected the flap rostrally exposing the frontal, temporal and parietal bones. I then created burr holes around the exposed skull, connected them with a craniotome, and turned a large skull flap. I then opened the dura in a cruciate manner relieving external pressure from the brain surface. I placed a piece of dural substitute over the brain surface to separate the cerebral surface from the scalp. I with Mrs. Meyran's assistance closed the scalp in a layered fashion approximating the galea, and staples for the scalp edges. I was not able to place the bone flap, because of his lean physique. I removed the Mayfield from the bed, and then from his head. I applied a sterile dressing on his incision. He was taken intubated to the ICU.  PLAN OF CARE: Admit to inpatient    PATIENT DISPOSITION:  ICU - intubated and critically ill.   Delay start of Pharmacological VTE agent (>24hrs) due to surgical blood loss or risk of bleeding:  yes

## 2017-02-02 NOTE — Progress Notes (Signed)
PT placed back on full vent support due to decreased patient effort.
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
## 2017-02-02 NOTE — Progress Notes (Signed)
Dr. Shon Hale notified of Na of 147. Will check for similar trend in next draw.  Night shift updated.

## 2017-02-03 DIAGNOSIS — I63132 Cerebral infarction due to embolism of left carotid artery: Secondary | ICD-10-CM

## 2017-02-03 LAB — GLUCOSE, CAPILLARY
GLUCOSE-CAPILLARY: 129 mg/dL — AB (ref 65–99)
GLUCOSE-CAPILLARY: 142 mg/dL — AB (ref 65–99)
GLUCOSE-CAPILLARY: 119 mg/dL — AB (ref 65–99)
GLUCOSE-CAPILLARY: 143 mg/dL — AB (ref 65–99)
Glucose-Capillary: 104 mg/dL — ABNORMAL HIGH (ref 65–99)
Glucose-Capillary: 111 mg/dL — ABNORMAL HIGH (ref 65–99)

## 2017-02-03 LAB — SODIUM: Sodium: 141 mmol/L (ref 135–145)

## 2017-02-03 LAB — CBC
HCT: 20.6 % — ABNORMAL LOW (ref 39.0–52.0)
Hemoglobin: 6.8 g/dL — CL (ref 13.0–17.0)
MCH: 29.8 pg (ref 26.0–34.0)
MCHC: 33 g/dL (ref 30.0–36.0)
MCV: 90.4 fL (ref 78.0–100.0)
Platelets: 213 10*3/uL (ref 150–400)
RBC: 2.28 MIL/uL — AB (ref 4.22–5.81)
RDW: 14.1 % (ref 11.5–15.5)
WBC: 7.7 10*3/uL (ref 4.0–10.5)

## 2017-02-03 LAB — PREPARE RBC (CROSSMATCH)

## 2017-02-03 LAB — HEMOGLOBIN AND HEMATOCRIT, BLOOD
HCT: 26.5 % — ABNORMAL LOW (ref 39.0–52.0)
Hemoglobin: 8.7 g/dL — ABNORMAL LOW (ref 13.0–17.0)

## 2017-02-03 MED ORDER — SODIUM CHLORIDE 0.9 % IV SOLN: Freq: Once | INTRAVENOUS | Status: DC

## 2017-02-03 MED ORDER — HYDRALAZINE HCL 20 MG/ML IJ SOLN
10.0000 mg | INTRAMUSCULAR | Status: DC | PRN
  Administered 2017-02-04: 10 mg via INTRAVENOUS
  Filled 2017-02-03: qty 1

## 2017-02-03 MED ORDER — LABETALOL HCL 5 MG/ML IV SOLN
10.0000 mg | INTRAVENOUS | Status: DC | PRN
  Administered 2017-02-12: 10 mg via INTRAVENOUS
  Filled 2017-02-03 (×2): qty 4
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
## 2017-02-03 NOTE — Progress Notes (Signed)
Patient ID: Arthur Beasley, male   DOB: [birth year], 36 y.o.   MRN: [id number] BP (!) 166/103   Pulse (!) 102   Temp 99.8 F (37.7 C) (Axillary)   Resp 19   Ht 5\' 7"  (1.702 m)   Wt 59.6 kg (131 lb 6.3 oz)   SpO2 100%   BMI 20.58 kg/m  Alert Moving left side purposefully Continues to wean well on vent Wound is clean, dry, no signs of infection perrl Hearing intact to voice

## 2017-02-03 NOTE — Progress Notes (Signed)
Dr. Nicole Kindred notified of Na of 146. Will check for similar trend in next draw. Fortino Sic, RN, BSN 02/03/2017 12:18 AM
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
## 2017-02-04 ENCOUNTER — Inpatient Hospital Stay (HOSPITAL_COMMUNITY): Payer: Medicaid Other | Admitting: Certified Registered Nurse Anesthetist

## 2017-02-04 ENCOUNTER — Inpatient Hospital Stay (HOSPITAL_COMMUNITY): Payer: Medicaid Other

## 2017-02-04 LAB — CBC
HCT: 24.2 % — ABNORMAL LOW (ref 39.0–52.0)
HEMOGLOBIN: 8 g/dL — AB (ref 13.0–17.0)
MCH: 29.1 pg (ref 26.0–34.0)
MCHC: 33.1 g/dL (ref 30.0–36.0)
MCV: 88 fL (ref 78.0–100.0)
Platelets: 272 10*3/uL (ref 150–400)
RBC: 2.75 MIL/uL — AB (ref 4.22–5.81)
RDW: 13.6 % (ref 11.5–15.5)
WBC: 8.8 10*3/uL (ref 4.0–10.5)

## 2017-02-04 LAB — BPAM RBC
Blood Product Expiration Date: 201804092359
ISSUE DATE / TIME: 201803200806
UNIT TYPE AND RH: 5100

## 2017-02-04 LAB — BASIC METABOLIC PANEL
Anion gap: 6 (ref 5–15)
BUN: 15 mg/dL (ref 6–20)
CALCIUM: 7.2 mg/dL — AB (ref 8.9–10.3)
CO2: 25 mmol/L (ref 22–32)
Chloride: 111 mmol/L (ref 101–111)
Creatinine, Ser: 0.52 mg/dL — ABNORMAL LOW (ref 0.61–1.24)
GFR calc Af Amer: >60 mL/min (ref >60)
GLUCOSE: 108 mg/dL — AB (ref 65–99)
Potassium: 3.2 mmol/L — ABNORMAL LOW (ref 3.5–5.1)
Sodium: 142 mmol/L (ref 135–145)

## 2017-02-04 LAB — TYPE AND SCREEN
ABO/RH(D): O POS
ANTIBODY SCREEN: NEGATIVE
Unit division: 0

## 2017-02-04 LAB — GLUCOSE, CAPILLARY
GLUCOSE-CAPILLARY: 129 mg/dL — AB (ref 65–99)
GLUCOSE-CAPILLARY: 112 mg/dL — AB (ref 65–99)
GLUCOSE-CAPILLARY: 112 mg/dL — AB (ref 65–99)
GLUCOSE-CAPILLARY: 104 mg/dL — AB (ref 65–99)
GLUCOSE-CAPILLARY: 108 mg/dL — AB (ref 65–99)
Glucose-Capillary: 73 mg/dL (ref 65–99)

## 2017-02-04 MED ORDER — MIDAZOLAM HCL 2 MG/2ML IJ SOLN
2.0000 mg | INTRAMUSCULAR | Status: DC | PRN
  Administered 2017-02-09 – 2017-02-11 (×2): 2 mg via INTRAVENOUS
  Filled 2017-02-04 (×3): qty 2

## 2017-02-04 MED ORDER — BISACODYL 10 MG RE SUPP
10.0000 mg | Freq: Every day | RECTAL | Status: DC | PRN
  Filled 2017-02-04: qty 1

## 2017-02-04 MED ORDER — PANTOPRAZOLE SODIUM 40 MG PO PACK
40.0000 mg | PACK | Freq: Every day | ORAL | Status: DC
  Administered 2017-02-04 – 2017-02-19 (×15): 40 mg
  Filled 2017-02-04 (×15): qty 20

## 2017-02-04 MED ORDER — SUCCINYLCHOLINE CHLORIDE 20 MG/ML IJ SOLN
INTRAMUSCULAR | Status: DC | PRN
  Administered 2017-02-04: 120 mg via INTRAVENOUS

## 2017-02-04 MED ORDER — BISACODYL 10 MG RE SUPP
10.0000 mg | Freq: Every day | RECTAL | Status: DC | PRN
  Administered 2017-02-05: 10 mg via RECTAL
  Filled 2017-02-04: qty 1

## 2017-02-04 MED ORDER — FENTANYL CITRATE (PF) 100 MCG/2ML IJ SOLN
50.0000 ug | Freq: Once | INTRAMUSCULAR | Status: AC
  Administered 2017-02-04: 50 ug via INTRAVENOUS
  Filled 2017-02-04: qty 2

## 2017-02-04 MED ORDER — FENTANYL 2500MCG IN NS 250ML (10MCG/ML) PREMIX INFUSION
25.0000 ug/h | INTRAVENOUS | Status: DC
  Administered 2017-02-04 – 2017-02-13 (×3): 50 ug/h via INTRAVENOUS
  Filled 2017-02-04 (×3): qty 250

## 2017-02-04 MED ORDER — FENTANYL BOLUS VIA INFUSION
50.0000 ug | INTRAVENOUS | Status: DC | PRN
  Filled 2017-02-04: qty 50

## 2017-02-04 MED ORDER — POLYETHYLENE GLYCOL 3350 17 G PO PACK
17.0000 g | PACK | Freq: Every day | ORAL | Status: DC
  Administered 2017-02-04 – 2017-02-18 (×10): 17 g via ORAL
  Filled 2017-02-04 (×10): qty 1

## 2017-02-04 MED ORDER — POLYETHYLENE GLYCOL 3350 17 G PO PACK: 17.0000 g | PACK | Freq: Every day | ORAL | Status: DC | PRN

## 2017-02-04 MED ORDER — DEXMEDETOMIDINE HCL IN NACL 200 MCG/50ML IV SOLN
0.0000 ug/kg/h | INTRAVENOUS | Status: AC
  Administered 2017-02-04 (×2): 0.4 ug/kg/h via INTRAVENOUS
  Filled 2017-02-04 (×2): qty 50

## 2017-02-04 MED ORDER — MIDAZOLAM HCL 2 MG/2ML IJ SOLN
2.0000 mg | INTRAMUSCULAR | Status: AC | PRN
  Administered 2017-02-04 – 2017-02-09 (×3): 2 mg via INTRAVENOUS
  Filled 2017-02-04 (×2): qty 2

## 2017-02-04 MED ORDER — BETHANECHOL CHLORIDE 10 MG PO TABS
10.0000 mg | ORAL_TABLET | Freq: Three times a day (TID) | ORAL | Status: DC
  Administered 2017-02-04 – 2017-02-19 (×44): 10 mg
  Filled 2017-02-04 (×46): qty 1

## 2017-02-04 MED ORDER — PROPOFOL 10 MG/ML IV BOLUS
INTRAVENOUS | Status: DC | PRN
  Administered 2017-02-04: 200 mg via INTRAVENOUS

## 2017-02-04 NOTE — Anesthesia Procedure Notes (Signed)
Procedure Name: Intubation Date/Time: 02/04/2017 5:43 PM Performed by: Salli Quarry WEAVER Pre-anesthesia Checklist: Patient identified, Emergency Drugs available, Suction available and Patient being monitored Patient Re-evaluated:Patient Re-evaluated prior to inductionOxygen Delivery Method: Ambu bag Preoxygenation: Pre-oxygenation with 100% oxygen Intubation Type: IV induction and Rapid sequence Laryngoscope Size: McGraph and 4 Grade View: Grade I Tube type: Subglottic suction tube Tube size: 7.5 mm Number of attempts: 1 Airway Equipment and Method: Stylet and Oral airway Placement Confirmation: ETT inserted through vocal cords under direct vision,  positive ETCO2 and breath sounds checked- equal and bilateral Secured at: 22 cm Tube secured with: Tape Dental Injury: Teeth and Oropharynx as per pre-operative assessment
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
## 2017-02-04 NOTE — Progress Notes (Signed)
Inpatient Rehabilitation  Per PT request, patient was screened by Velmer Woelfel for appropriateness for an Inpatient Acute Rehab consult.  At this time we are recommending an Inpatient Rehab consult.  Please order if you are agreeable.    Cardin Nitschke, M.A., CCC/SLP Admission Coordinator  [facility]  Cell [phone]  
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
## 2017-02-04 NOTE — Progress Notes (Signed)
Patient ID: Arthur Beasley, male   DOB: 01/09/1981, 36 y.o.   MRN: [id number] BP 124/86   Pulse 91   Temp 98.8 F (37.1 C) (Axillary)   Resp 13   Ht 5\' 7"  (1.702 m)   Wt 54.6 kg (120 lb 5.9 oz)   SpO2 100%   BMI 18.85 kg/m  Alert, aphasic Moving left side well Plegic on right Aphasic, expressive, and recptive Wound is clean, dry, no signs of infection.  Continue current care

## 2017-02-04 NOTE — Progress Notes (Signed)
The patient has struggled since extubation this morning, primarily with secretions.  Although he has maintained his saturations, He clearly is not tolerating not having an airway.  I have asked anesthesia to reintubate this patient for airway protection.  He will need a trach and PEG next week.  Kathryne Eriksson. Dahlia Bailiff, MD, [place] [phone] Trauma Surgeon

## 2017-02-04 NOTE — Procedures (Signed)
Extubation Procedure Note  Patient Details:   Name: Arthur Beasley DOB: [birth year] MRN: [id number]   Airway Documentation:     Evaluation  O2 sats: stable throughout Complications: No apparent complications Patient did tolerate procedure well. Bilateral Breath Sounds: Clear, Diminished   No   Pt had positive cuff leak. Pt placed on [place] 2L with humidity. No stridor noted, pt has productive cough and uses the [place].   [place] Amber Julius Boniface 02/04/2017, 1:54 PM

## 2017-02-04 NOTE — Progress Notes (Signed)
Patient transported on vent to CT and back to [id number] without complication.

## 2017-02-04 NOTE — Progress Notes (Signed)
Verbal order to NT suction patient. Pt with excessive secretions. RN unable to reach the thick sputum with yankeur. Patient O2 sats stable throughout. Pt resting comfortably at this time. Arthur Beasley, Rande Brunt, RN
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
## 2017-02-05 LAB — GLUCOSE, CAPILLARY
GLUCOSE-CAPILLARY: 113 mg/dL — AB (ref 65–99)
GLUCOSE-CAPILLARY: 128 mg/dL — AB (ref 65–99)
Glucose-Capillary: 132 mg/dL — ABNORMAL HIGH (ref 65–99)
Glucose-Capillary: 113 mg/dL — ABNORMAL HIGH (ref 65–99)
Glucose-Capillary: 94 mg/dL (ref 65–99)

## 2017-02-05 LAB — CBC
HCT: 27.8 % — ABNORMAL LOW (ref 39.0–52.0)
HEMOGLOBIN: 9.1 g/dL — AB (ref 13.0–17.0)
MCH: 29.6 pg (ref 26.0–34.0)
MCHC: 32.7 g/dL (ref 30.0–36.0)
MCV: 90.6 fL (ref 78.0–100.0)
Platelets: 354 10*3/uL (ref 150–400)
RBC: 3.07 MIL/uL — ABNORMAL LOW (ref 4.22–5.81)
RDW: 14.1 % (ref 11.5–15.5)
WBC: 10.9 10*3/uL — ABNORMAL HIGH (ref 4.0–10.5)

## 2017-02-05 LAB — BASIC METABOLIC PANEL
Anion gap: 9 (ref 5–15)
BUN: 23 mg/dL — AB (ref 6–20)
CO2: 27 mmol/L (ref 22–32)
CREATININE: 0.67 mg/dL (ref 0.61–1.24)
Calcium: 8.2 mg/dL — ABNORMAL LOW (ref 8.9–10.3)
Chloride: 105 mmol/L (ref 101–111)
GFR calc Af Amer: >60 mL/min (ref >60)
GFR calc non Af Amer: >60 mL/min (ref >60)
GLUCOSE: 131 mg/dL — AB (ref 65–99)
POTASSIUM: 3.9 mmol/L (ref 3.5–5.1)
Sodium: 141 mmol/L (ref 135–145)

## 2017-02-05 MED ORDER — HYDROCODONE-ACETAMINOPHEN 7.5-325 MG/15ML PO SOLN
15.0000 mL | ORAL | Status: DC | PRN
  Administered 2017-02-05 – 2017-02-19 (×15): 15 mL
  Filled 2017-02-05 (×16): qty 15

## 2017-02-05 MED ORDER — AMLODIPINE BESYLATE 5 MG PO TABS
5.0000 mg | ORAL_TABLET | Freq: Every day | ORAL | Status: DC
  Administered 2017-02-05 – 2017-02-19 (×13): 5 mg
  Filled 2017-02-05 (×15): qty 1
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
## 2017-02-05 NOTE — Progress Notes (Signed)
Rehab Admissions Coordinator Note:  Patient was screened by [name] for appropriateness for an Inpatient Acute Rehab Consult. Once patient is more medically stable and participating more fully with therapies, can then consider ordering an inpatient rehab consult.  I will follow along at a distance.  [name] 02/05/2017, 4:24 PM  I can be reached at [phone].
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
## 2017-02-05 NOTE — Progress Notes (Signed)
175 cc remainder of fentanyl drip wasted in sink. Tharon Aquas RN as witness.
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
## 2017-02-06 ENCOUNTER — Inpatient Hospital Stay (HOSPITAL_COMMUNITY): Payer: Medicaid Other

## 2017-02-06 ENCOUNTER — Encounter (HOSPITAL_COMMUNITY): Payer: Self-pay | Admitting: *Deleted

## 2017-02-06 LAB — GLUCOSE, CAPILLARY
GLUCOSE-CAPILLARY: 134 mg/dL — AB (ref 65–99)
GLUCOSE-CAPILLARY: 130 mg/dL — AB (ref 65–99)
Glucose-Capillary: 102 mg/dL — ABNORMAL HIGH (ref 65–99)
Glucose-Capillary: 115 mg/dL — ABNORMAL HIGH (ref 65–99)
Glucose-Capillary: 118 mg/dL — ABNORMAL HIGH (ref 65–99)
Glucose-Capillary: 118 mg/dL — ABNORMAL HIGH (ref 65–99)
Glucose-Capillary: 142 mg/dL — ABNORMAL HIGH (ref 65–99)

## 2017-02-06 LAB — BASIC METABOLIC PANEL
ANION GAP: 10 (ref 5–15)
BUN: 21 mg/dL — ABNORMAL HIGH (ref 6–20)
CHLORIDE: 106 mmol/L (ref 101–111)
CO2: 25 mmol/L (ref 22–32)
Calcium: 8.2 mg/dL — ABNORMAL LOW (ref 8.9–10.3)
Creatinine, Ser: 0.6 mg/dL — ABNORMAL LOW (ref 0.61–1.24)
GLUCOSE: 110 mg/dL — AB (ref 65–99)
POTASSIUM: 4 mmol/L (ref 3.5–5.1)
SODIUM: 141 mmol/L (ref 135–145)

## 2017-02-06 LAB — CBC
HCT: 27.6 % — ABNORMAL LOW (ref 39.0–52.0)
HEMOGLOBIN: 9.1 g/dL — AB (ref 13.0–17.0)
MCH: 29.7 pg (ref 26.0–34.0)
MCHC: 33 g/dL (ref 30.0–36.0)
MCV: 90.2 fL (ref 78.0–100.0)
Platelets: 417 10*3/uL — ABNORMAL HIGH (ref 150–400)
RBC: 3.06 MIL/uL — AB (ref 4.22–5.81)
RDW: 13.7 % (ref 11.5–15.5)
WBC: 12.3 10*3/uL — AB (ref 4.0–10.5)

## 2017-02-06 MED ORDER — ASPIRIN 325 MG PO TABS
325.0000 mg | ORAL_TABLET | Freq: Every day | ORAL | Status: DC
  Administered 2017-02-07 – 2017-02-11 (×4): 325 mg
  Filled 2017-02-06 (×4): qty 1

## 2017-02-06 NOTE — Progress Notes (Signed)
Patient ID: Arthur Beasley, male   DOB: Apr 11, 1981, 36 y.o.   MRN: [id number] BP (!) 126/91   Pulse 96   Temp 98.6 F (37 C) (Oral)   Resp 18   Ht 5\' 7"  (1.702 m)   Wt 53.2 kg (117 lb 4.6 oz)   SpO2 100%   BMI 18.37 kg/m  Alert, following commands, using blinks as yes or no. Moving left side well Remains intubated Will remove staples today Improving overall

## 2017-02-06 NOTE — Progress Notes (Signed)
Patient ID: Arthur Beasley, male   DOB: July 19, 1981, 36 y.o.   MRN: [id number] I discussed Dr. Richarda Blade plan for trach/PEG on Monday including the procedures, risks, and benefits with his family. I answered their questions. Consent obtained.  Georganna Skeans, MD, MPH, FACS Trauma: [phone] General Surgery: [phone]
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
## 2017-02-06 NOTE — Progress Notes (Signed)
Follow up - Trauma and Critical Care  Patient Details:    Arthur Beasley is an 36 y.o. male.  Lines/tubes : Airway 7.5 mm (Active)  Secured at (cm) 22 cm 02/06/2017  8:15 AM  Measured From Lips 02/06/2017  8:15 AM  Secured Location Center 02/06/2017  8:15 AM  Secured By [employer] 02/06/2017  8:15 AM  Tube Holder Repositioned Yes 02/06/2017  8:15 AM  Cuff Pressure (cm H2O) 24 cm H2O 02/06/2017  3:37 AM  Site Condition Dry 02/06/2017  8:15 AM     PICC Double Lumen 01/29/17 PICC Right Brachial 36 cm 0 cm (Active)  Indication for Insertion or Continuance of Line Prolonged intravenous therapies 02/06/2017  7:43 AM  Exposed Catheter (cm) 0 cm 01/29/2017  8:00 PM  Site Assessment Clean;Dry;Intact 02/05/2017 10:00 PM  Lumen #1 Status Infusing 02/05/2017 10:00 PM  Lumen #2 Status Capped (Central line) 02/06/2017  6:00 AM  Dressing Type Transparent;Occlusive 02/05/2017 10:00 PM  Dressing Status Clean;Dry;Intact;Antimicrobial disc in place 02/05/2017 10:00 PM  Line Care Lumen 2 tubing changed;Connections checked and tightened 02/06/2017  6:00 AM  Dressing Intervention Dressing changed;Antimicrobial disc changed 02/05/2017  3:15 PM  Dressing Change Due 02/12/17 02/05/2017 10:00 PM     NG/OG Tube Orogastric 16 Fr. Center mouth Xray Measured external length of tube (Active)  Site Assessment Clean;Dry;Intact 02/05/2017  8:00 PM  Ongoing Placement Verification Xray;No change in respiratory status 02/05/2017  8:00 PM  Status Infusing tube feed 02/05/2017  8:00 PM  Intake (mL) 30 mL 02/06/2017 12:00 AM     Urethral Catheter [name] RN Non-latex 16 Fr. (Active)  Indication for Insertion or Continuance of Catheter Other (comment) 02/06/2017  8:00 AM  Site Assessment Clean;Intact 02/05/2017  8:00 PM  Catheter Maintenance Catheter secured;Bag below level of bladder;Drainage bag/tubing not touching floor;No dependent loops;Insertion date on drainage bag;Seal intact 02/06/2017  8:00 AM  Collection  Container Standard drainage bag 02/05/2017  8:00 PM  Securement Method Securing device (Describe) 02/05/2017  8:00 PM  Urinary Catheter Interventions Unclamped 02/04/2017  8:00 PM  Output (mL) 250 mL 02/06/2017  6:00 AM    Microbiology/Sepsis markers: Results for orders placed or performed during the hospital encounter of 01/27/17  MRSA PCR Screening     Status: None   Collection Time: 01/28/17 12:24 AM  Result Value Ref Range Status   MRSA by PCR NEGATIVE NEGATIVE Final    Comment:        The GeneXpert MRSA Assay (FDA approved for NASAL specimens only), is one component of a comprehensive MRSA colonization surveillance program. It is not intended to diagnose MRSA infection nor to guide or monitor treatment for MRSA infections.     Anti-infectives:  Anti-infectives    Start     Dose/Rate Route Frequency Ordered Stop   01/27/17 2200  clindamycin (CLEOCIN) IVPB 300 mg  Status:  Discontinued     300 mg 100 mL/hr over 30 Minutes Intravenous Every 6 hours 01/27/17 2127 02/05/17 4782      Best Practice/Protocols:  VTE Prophylaxis: Lovenox (prophylaxtic dose) and Mechanical GI Prophylaxis: Proton Pump Inhibitor No sedation  Consults: Treatment Team:  Trauma Md, MD Angelia Mould, MD Ashok Pall, MD    Events:  Subjective:    Overnight Issues: No issues.  Off of all sedation.  Objective:  Vital signs for last 24 hours: Temp:  [97.7 F (36.5 C)-99.1 F (37.3 C)] 99.1 F (37.3 C) (03/23 0800) Pulse Rate:  [83-114] 103 (03/23 0700) Resp:  [  13-26] 14 (03/23 0700) BP: (111-142)/(75-99) 123/86 (03/23 0700) SpO2:  [100 %] 100 % (03/23 0815) FiO2 (%):  [30 %] 30 % (03/23 0815) Weight:  [53.2 kg (117 lb 4.6 oz)] 53.2 kg (117 lb 4.6 oz) (03/23 0500)  Hemodynamic parameters for last 24 hours:    Intake/Output from previous day: 03/22 0701 - 03/23 0700 In: 2934.6 [I.V.:1324.6; NG/GT:1400; IV Piggyback:210] Out: 2500 [Urine:2500]  Intake/Output this shift: No  intake/output data recorded.  Vent settings for last 24 hours: Vent Mode: CPAP;PSV FiO2 (%):  [30 %] 30 % Set Rate:  [13 bmp] 13 bmp Vt Set:  [530 mL] 530 mL PEEP:  [5 cmH20] 5 cmH20 Pressure Support:  [5 cmH20-8 cmH20] 5 cmH20 Plateau Pressure:  [15 cmH20-16 cmH20] 15 cmH20  Physical Exam:  General: alert and no respiratory distress Neuro: alert, oriented, weakness right upper extremity, weakness right lower extremity and right hemiparesis Resp: clear to auscultation bilaterally CVS: regular rate and rhythm, S1, S2 normal, no murmur, click, rub or gallop GI: soft, nontender, BS WNL, no r/g Extremities: no edema, no erythema, pulses WNL  Results for orders placed or performed during the hospital encounter of 01/27/17 (from the past 24 hour(s))  Glucose, capillary     Status: None   Collection Time: 02/05/17 12:01 PM  Result Value Ref Range   Glucose-Capillary 94 65 - 99 mg/dL   Comment 1 Notify RN    Comment 2 Document in Chart   Glucose, capillary     Status: Abnormal   Collection Time: 02/05/17  3:25 PM  Result Value Ref Range   Glucose-Capillary 113 (H) 65 - 99 mg/dL  Glucose, capillary     Status: Abnormal   Collection Time: 02/05/17  8:24 PM  Result Value Ref Range   Glucose-Capillary 128 (H) 65 - 99 mg/dL   Comment 1 Notify RN    Comment 2 Document in Chart   Glucose, capillary     Status: Abnormal   Collection Time: 02/06/17 12:27 AM  Result Value Ref Range   Glucose-Capillary 102 (H) 65 - 99 mg/dL  Glucose, capillary     Status: Abnormal   Collection Time: 02/06/17  4:31 AM  Result Value Ref Range   Glucose-Capillary 142 (H) 65 - 99 mg/dL  CBC     Status: Abnormal   Collection Time: 02/06/17  5:54 AM  Result Value Ref Range   WBC 12.3 (H) 4.0 - 10.5 K/uL   RBC 3.06 (L) 4.22 - 5.81 MIL/uL   Hemoglobin 9.1 (L) 13.0 - 17.0 g/dL   HCT 27.6 (L) 39.0 - 52.0 %   MCV 90.2 78.0 - 100.0 fL   MCH 29.7 26.0 - 34.0 pg   MCHC 33.0 30.0 - 36.0 g/dL   RDW 13.7 11.5 -  15.5 %   Platelets 417 (H) 150 - 400 K/uL  Basic metabolic panel     Status: Abnormal   Collection Time: 02/06/17  5:54 AM  Result Value Ref Range   Sodium 141 135 - 145 mmol/L   Potassium 4.0 3.5 - 5.1 mmol/L   Chloride 106 101 - 111 mmol/L   CO2 25 22 - 32 mmol/L   Glucose, Bld 110 (H) 65 - 99 mg/dL   BUN 21 (H) 6 - 20 mg/dL   Creatinine, Ser 0.60 (L) 0.61 - 1.24 mg/dL   Calcium 8.2 (L) 8.9 - 10.3 mg/dL   GFR calc non Af Amer >60 >60 mL/min   GFR calc Af Amer >60 >60 mL/min  Anion gap 10 5 - 15  Glucose, capillary     Status: Abnormal   Collection Time: 02/06/17  7:30 AM  Result Value Ref Range   Glucose-Capillary 134 (H) 65 - 99 mg/dL     Assessment/Plan:   NEURO  Altered Mental Status:  stupor and awake   Plan: Continue to hold his sedations  PULM  No issues   Plan: CPM.  Cannot extubate but may be able to go ahead with T/G next week.  CARDIO  No specific issues   Plan: CPM  RENAL  Urine output and renal function are good   Plan: CPM  GI  No issues.   Plan: Continue tube feedings.  ID  No known infecitous problems   Plan: CPM  HEME  Anemia acute blood loss anemia and anemia of critical illness)   Plan: No transfusion necessary.  ENDO No issues   Plan: CPM  Global Issues  Patient failed extubation two days ago, primarily because of failure to control secretions.  He needs trach and PEG.  Will plan this for MOnday afternoon. At the bedside.    LOS: 10 days   Additional comments:I reviewed the patient's new clinical lab test results. cbc/bmet and I reviewed the patients new imaging test results. CXR  Critical Care Total Time*: 30 Minutes  Daun Rens 02/06/2017  *Care during the described time interval was provided by me and/or other providers on the critical care team.  I have reviewed this patient's available data, including medical history, events of note, physical examination and test results as part of my evaluation.
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
## 2017-02-07 DIAGNOSIS — J9600 Acute respiratory failure, unspecified whether with hypoxia or hypercapnia: Secondary | ICD-10-CM

## 2017-02-07 LAB — BASIC METABOLIC PANEL
Anion gap: 10 (ref 5–15)
BUN: 20 mg/dL (ref 6–20)
CHLORIDE: 106 mmol/L (ref 101–111)
CO2: 25 mmol/L (ref 22–32)
CREATININE: 0.62 mg/dL (ref 0.61–1.24)
Calcium: 8.7 mg/dL — ABNORMAL LOW (ref 8.9–10.3)
GFR calc non Af Amer: >60 mL/min (ref >60)
Glucose, Bld: 113 mg/dL — ABNORMAL HIGH (ref 65–99)
POTASSIUM: 3.9 mmol/L (ref 3.5–5.1)
SODIUM: 141 mmol/L (ref 135–145)

## 2017-02-07 LAB — CBC WITH DIFFERENTIAL/PLATELET
BASOS ABS: 0 10*3/uL (ref 0.0–0.1)
Basophils Relative: 0 %
Eosinophils Absolute: 0.4 10*3/uL (ref 0.0–0.7)
Eosinophils Relative: 3 %
HEMATOCRIT: 28.5 % — AB (ref 39.0–52.0)
HEMOGLOBIN: 9.3 g/dL — AB (ref 13.0–17.0)
LYMPHS ABS: 1.4 10*3/uL (ref 0.7–4.0)
Lymphocytes Relative: 10 %
MCH: 29.1 pg (ref 26.0–34.0)
MCHC: 32.6 g/dL (ref 30.0–36.0)
MCV: 89.1 fL (ref 78.0–100.0)
Monocytes Absolute: 1.2 10*3/uL — ABNORMAL HIGH (ref 0.1–1.0)
Monocytes Relative: 8 %
NEUTROS ABS: 11.2 10*3/uL — AB (ref 1.7–7.7)
Neutrophils Relative %: 79 %
Platelets: 512 10*3/uL — ABNORMAL HIGH (ref 150–400)
RBC: 3.2 MIL/uL — ABNORMAL LOW (ref 4.22–5.81)
RDW: 13.2 % (ref 11.5–15.5)
WBC: 14.1 10*3/uL — AB (ref 4.0–10.5)

## 2017-02-07 LAB — GLUCOSE, CAPILLARY
GLUCOSE-CAPILLARY: 114 mg/dL — AB (ref 65–99)
Glucose-Capillary: 117 mg/dL — ABNORMAL HIGH (ref 65–99)
Glucose-Capillary: 117 mg/dL — ABNORMAL HIGH (ref 65–99)
Glucose-Capillary: 110 mg/dL — ABNORMAL HIGH (ref 65–99)
Glucose-Capillary: 128 mg/dL — ABNORMAL HIGH (ref 65–99)
Glucose-Capillary: 128 mg/dL — ABNORMAL HIGH (ref 65–99)

## 2017-02-07 LAB — VITAMIN B12: VITAMIN B 12: 733 pg/mL (ref 180–914)

## 2017-02-07 LAB — LIPID PANEL
CHOL/HDL RATIO: 3.6 ratio
Cholesterol: 132 mg/dL (ref 0–200)
HDL: 37 mg/dL — AB (ref >40)
LDL CALC: 87 mg/dL (ref 0–99)
Triglycerides: 40 mg/dL (ref <150)
VLDL: 8 mg/dL (ref 0–40)

## 2017-02-07 LAB — TSH: TSH: 3.516 u[IU]/mL (ref 0.350–4.500)
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
## 2017-02-08 ENCOUNTER — Inpatient Hospital Stay (HOSPITAL_COMMUNITY): Payer: Medicaid Other

## 2017-02-08 DIAGNOSIS — D72829 Elevated white blood cell count, unspecified: Secondary | ICD-10-CM

## 2017-02-08 DIAGNOSIS — I6789 Other cerebrovascular disease: Secondary | ICD-10-CM

## 2017-02-08 LAB — BASIC METABOLIC PANEL
Anion gap: 10 (ref 5–15)
BUN: 21 mg/dL — AB (ref 6–20)
CALCIUM: 8.6 mg/dL — AB (ref 8.9–10.3)
CO2: 26 mmol/L (ref 22–32)
CREATININE: 0.61 mg/dL (ref 0.61–1.24)
Chloride: 103 mmol/L (ref 101–111)
GFR calc Af Amer: >60 mL/min (ref >60)
GFR calc non Af Amer: >60 mL/min (ref >60)
GLUCOSE: 144 mg/dL — AB (ref 65–99)
POTASSIUM: 4 mmol/L (ref 3.5–5.1)
Sodium: 139 mmol/L (ref 135–145)

## 2017-02-08 LAB — GLUCOSE, CAPILLARY
GLUCOSE-CAPILLARY: 110 mg/dL — AB (ref 65–99)
Glucose-Capillary: 104 mg/dL — ABNORMAL HIGH (ref 65–99)
Glucose-Capillary: 121 mg/dL — ABNORMAL HIGH (ref 65–99)
Glucose-Capillary: 132 mg/dL — ABNORMAL HIGH (ref 65–99)
Glucose-Capillary: 87 mg/dL (ref 65–99)
Glucose-Capillary: 90 mg/dL (ref 65–99)

## 2017-02-08 LAB — ECHOCARDIOGRAM COMPLETE
Height: 67 in
WEIGHTICAEL: 1767.21 [oz_av]

## 2017-02-08 LAB — CBC
HCT: 28.2 % — ABNORMAL LOW (ref 39.0–52.0)
Hemoglobin: 9.2 g/dL — ABNORMAL LOW (ref 13.0–17.0)
MCH: 29.3 pg (ref 26.0–34.0)
MCHC: 32.6 g/dL (ref 30.0–36.0)
MCV: 89.8 fL (ref 78.0–100.0)
PLATELETS: 553 10*3/uL — AB (ref 150–400)
RBC: 3.14 MIL/uL — ABNORMAL LOW (ref 4.22–5.81)
RDW: 13.1 % (ref 11.5–15.5)
WBC: 13.5 10*3/uL — ABNORMAL HIGH (ref 4.0–10.5)

## 2017-02-08 LAB — TYPE AND SCREEN
ABO/RH(D): O POS
ANTIBODY SCREEN: NEGATIVE

## 2017-02-08 LAB — HEMOGLOBIN A1C
Hgb A1c MFr Bld: 5.3 % (ref 4.8–5.6)
Mean Plasma Glucose: 105 mg/dL

## 2017-02-08 LAB — MAGNESIUM: Magnesium: 1.9 mg/dL (ref 1.7–2.4)

## 2017-02-08 LAB — PHOSPHORUS: Phosphorus: 3.1 mg/dL (ref 2.5–4.6)

## 2017-02-08 NOTE — Progress Notes (Signed)
  Echocardiogram 2D Echocardiogram has been performed.  Arthur Beasley 02/08/2017, 1:28 PM
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
## 2017-02-09 ENCOUNTER — Encounter (HOSPITAL_COMMUNITY): Admission: EM | Disposition: A | Discharge status: Rehabilitation Facility | Payer: Self-pay | Source: Home / Self Care

## 2017-02-09 ENCOUNTER — Ambulatory Visit: Payer: Self-pay | Admitting: Plastic Surgery

## 2017-02-09 DIAGNOSIS — J9601 Acute respiratory failure with hypoxia: Secondary | ICD-10-CM

## 2017-02-09 DIAGNOSIS — G936 Cerebral edema: Secondary | ICD-10-CM

## 2017-02-09 DIAGNOSIS — J9690 Respiratory failure, unspecified, unspecified whether with hypoxia or hypercapnia: Secondary | ICD-10-CM

## 2017-02-09 DIAGNOSIS — Z9889 Other specified postprocedural states: Secondary | ICD-10-CM

## 2017-02-09 DIAGNOSIS — S02602A Fracture of unspecified part of body of left mandible, initial encounter for closed fracture: Secondary | ICD-10-CM

## 2017-02-09 HISTORY — PX: ESOPHAGOGASTRODUODENOSCOPY: SHX5428

## 2017-02-09 HISTORY — PX: PEG PLACEMENT: SHX5437

## 2017-02-09 HISTORY — PX: PERCUTANEOUS TRACHEOSTOMY: SHX5288

## 2017-02-09 LAB — CBC
HCT: 28.3 % — ABNORMAL LOW (ref 39.0–52.0)
Hemoglobin: 9.2 g/dL — ABNORMAL LOW (ref 13.0–17.0)
MCH: 28.8 pg (ref 26.0–34.0)
MCHC: 32.5 g/dL (ref 30.0–36.0)
MCV: 88.4 fL (ref 78.0–100.0)
Platelets: 630 10*3/uL — ABNORMAL HIGH (ref 150–400)
RBC: 3.2 MIL/uL — AB (ref 4.22–5.81)
RDW: 12.8 % (ref 11.5–15.5)
WBC: 13.6 10*3/uL — ABNORMAL HIGH (ref 4.0–10.5)

## 2017-02-09 LAB — GLUCOSE, CAPILLARY
GLUCOSE-CAPILLARY: 110 mg/dL — AB (ref 65–99)
GLUCOSE-CAPILLARY: 104 mg/dL — AB (ref 65–99)
GLUCOSE-CAPILLARY: 106 mg/dL — AB (ref 65–99)
GLUCOSE-CAPILLARY: 94 mg/dL (ref 65–99)
GLUCOSE-CAPILLARY: 139 mg/dL — AB (ref 65–99)
Glucose-Capillary: 142 mg/dL — ABNORMAL HIGH (ref 65–99)

## 2017-02-09 LAB — BASIC METABOLIC PANEL
Anion gap: 9 (ref 5–15)
BUN: 19 mg/dL (ref 6–20)
CO2: 26 mmol/L (ref 22–32)
CREATININE: 0.57 mg/dL — AB (ref 0.61–1.24)
Calcium: 8.8 mg/dL — ABNORMAL LOW (ref 8.9–10.3)
Chloride: 102 mmol/L (ref 101–111)
GFR calc Af Amer: >60 mL/min (ref >60)
GLUCOSE: 113 mg/dL — AB (ref 65–99)
Potassium: 4 mmol/L (ref 3.5–5.1)
SODIUM: 137 mmol/L (ref 135–145)

## 2017-02-09 LAB — PLATELET INHIBITION P2Y12: PLATELET FUNCTION P2Y12: 72 [PRU] — AB (ref 194–418)

## 2017-02-09 SURGERY — EGD (ESOPHAGOGASTRODUODENOSCOPY)
Anesthesia: Moderate Sedation

## 2017-02-09 SURGERY — CREATION, TRACHEOSTOMY, PERCUTANEOUS
Site: Neck

## 2017-02-09 MED ORDER — LIDOCAINE-EPINEPHRINE (PF) 1.5 %-1:200000 IJ SOLN
INTRAMUSCULAR | Status: DC | PRN
  Administered 2017-02-09: 4 mL via PERINEURAL

## 2017-02-09 MED ORDER — CHLORHEXIDINE GLUCONATE 0.12 % MT SOLN
OROMUCOSAL | Status: AC
  Administered 2017-02-09: 08:00:00
  Filled 2017-02-09: qty 15

## 2017-02-09 MED ORDER — FENTANYL CITRATE (PF) 100 MCG/2ML IJ SOLN
100.0000 ug | Freq: Once | INTRAMUSCULAR | Status: AC
  Administered 2017-02-09: 100 ug via INTRAVENOUS

## 2017-02-09 MED ORDER — FENTANYL CITRATE (PF) 100 MCG/2ML IJ SOLN
50.0000 ug | INTRAMUSCULAR | Status: DC | PRN
  Administered 2017-02-09 – 2017-02-17 (×11): 100 ug via INTRAVENOUS
  Filled 2017-02-09 (×11): qty 2

## 2017-02-09 MED ORDER — 0.9 % SODIUM CHLORIDE (POUR BTL) OPTIME
TOPICAL | Status: DC | PRN
  Administered 2017-02-09: 1000 mL

## 2017-02-09 MED ORDER — FENTANYL CITRATE (PF) 100 MCG/2ML IJ SOLN
100.0000 ug | Freq: Once | INTRAMUSCULAR | Status: DC
  Filled 2017-02-09: qty 2

## 2017-02-09 MED ORDER — MIDAZOLAM HCL 2 MG/2ML IJ SOLN
4.0000 mg | Freq: Once | INTRAMUSCULAR | Status: AC
  Administered 2017-02-09: 4 mg via INTRAVENOUS

## 2017-02-09 MED ORDER — VECURONIUM BROMIDE 10 MG IV SOLR
INTRAVENOUS | Status: AC
  Administered 2017-02-09: 10 mg
  Filled 2017-02-09: qty 20

## 2017-02-09 MED ORDER — FENTANYL CITRATE (PF) 100 MCG/2ML IJ SOLN
INTRAMUSCULAR | Status: AC
  Administered 2017-02-09: 100 ug
  Filled 2017-02-09: qty 4

## 2017-02-09 MED ORDER — MIDAZOLAM HCL 2 MG/2ML IJ SOLN
INTRAMUSCULAR | Status: AC
  Administered 2017-02-09: 2 mg
  Filled 2017-02-09: qty 8

## 2017-02-09 MED ORDER — VECURONIUM BROMIDE 10 MG IV SOLR: 10.0000 mg | Freq: Once | INTRAVENOUS | Status: DC

## 2017-02-09 MED ORDER — MIDAZOLAM HCL 2 MG/2ML IJ SOLN: 4.0000 mg | Freq: Once | INTRAMUSCULAR | Status: DC

## 2017-02-09 MED FILL — Mannitol IV Soln 25%: INTRAVENOUS | Qty: 200 | Status: AC

## 2017-02-09 SURGICAL SUPPLY — 33 items
DRAPE PROXIMA HALF (DRAPES) ×12 IMPLANT
DRAPE UTILITY XL STRL (DRAPES) ×3 IMPLANT
ELECT CAUTERY BLADE 6.4 (BLADE) ×3 IMPLANT
ELECT REM PT RETURN 9FT ADLT (ELECTROSURGICAL) ×3
ELECTRODE REM PT RTRN 9FT ADLT (ELECTROSURGICAL) ×1 IMPLANT
GAUZE SPONGE 4X4 16PLY XRAY LF (GAUZE/BANDAGES/DRESSINGS) ×3 IMPLANT
GLOVE BIO SURGEON STRL SZ 6 (GLOVE) IMPLANT
GLOVE BIO SURGEON STRL SZ7 (GLOVE) ×2 IMPLANT
GLOVE BIO SURGEON STRL SZ8 (GLOVE) IMPLANT
GLOVE BIOGEL PI IND STRL 6.5 (GLOVE) IMPLANT
GLOVE BIOGEL PI IND STRL 7.0 (GLOVE) IMPLANT
GLOVE BIOGEL PI IND STRL 8 (GLOVE) ×1 IMPLANT
GLOVE BIOGEL PI INDICATOR 6.5 (GLOVE)
GLOVE BIOGEL PI INDICATOR 7.0 (GLOVE) ×4
GLOVE BIOGEL PI INDICATOR 8 (GLOVE) ×2
GLOVE ECLIPSE 6.5 STRL STRAW (GLOVE) ×2 IMPLANT
GLOVE ECLIPSE 7.5 STRL STRAW (GLOVE) ×3 IMPLANT
GOWN STRL REUS W/ TWL LRG LVL3 (GOWN DISPOSABLE) ×2 IMPLANT
GOWN STRL REUS W/ TWL XL LVL3 (GOWN DISPOSABLE) IMPLANT
GOWN STRL REUS W/TWL LRG LVL3 (GOWN DISPOSABLE) ×9
GOWN STRL REUS W/TWL XL LVL3 (GOWN DISPOSABLE)
INTRODUCER TRACH BLUE RHINO 6F (TUBING) IMPLANT
INTRODUCER TRACH BLUE RHINO 8F (TUBING) ×2 IMPLANT
NS IRRIG 1000ML POUR BTL (IV SOLUTION) ×3 IMPLANT
PENCIL BUTTON HOLSTER BLD 10FT (ELECTRODE) ×3 IMPLANT
SPONGE INTESTINAL PEANUT (DISPOSABLE) ×3 IMPLANT
SUT SILK 2 0 SH CR/8 (SUTURE) ×3 IMPLANT
SUT VICRYL AB 3 0 TIES (SUTURE) ×3 IMPLANT
TOWEL OR 17X24 6PK STRL BLUE (TOWEL DISPOSABLE) ×2 IMPLANT
TOWEL OR 17X26 10 PK STRL BLUE (TOWEL DISPOSABLE) ×1 IMPLANT
TUBE CONNECTING 12'X1/4 (SUCTIONS) ×1
TUBE CONNECTING 12X1/4 (SUCTIONS) ×2 IMPLANT
YANKAUER SUCT BULB TIP NO VENT (SUCTIONS) ×3 IMPLANT
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
## 2017-02-09 NOTE — Progress Notes (Signed)
PEG placed with Dr. Hulen Skains bedside. Corliss Parish at bedside with Dr. Hulen Skains. Jobe Igo, RN

## 2017-02-09 NOTE — Progress Notes (Signed)
Trach care done per RRT. Site was soiled with blood. actively bleeding around the trach flanged and upper chest. Site was cleansed with sterile water, dried, and clean dressing applied. New IC inserted and locked back into place w/o complications. No distress noted. No complications noted except tachycardia. Pt is stable at this time SATs are 100% on 30% FiO2 through mechanical ventilation.

## 2017-02-09 NOTE — Op Note (Signed)
OPERATIVE REPORT  DATE OF OPERATION:  02/09/2017  PATIENT:  Arthur Beasley  36 y.o. male  PRE-OPERATIVE DIAGNOSIS:  BRAIN INJURY  POST-OPERATIVE DIAGNOSIS:  BRAIN INJURY  INDICATION(S) FOR OPERATION:  Airway protection and ventilation  FINDINGS:  Normal anatomy  PROCEDURE:  Procedure(s): BEDSIDE PERCUTANEOUS TRACHEOSTOMY  SURGEON:  Surgeon(s): Judeth Horn, MD  ASSISTANT: Focht, PA-C  ANESTHESIA:   local and IV sedation  COMPLICATIONS:  Bleeding around the tracheostomy site  EBL: 20 ml  BLOOD ADMINISTERED: none  DRAINS: none, Urinary Catheter (Foley) and Gastrostomy Tube   SPECIMEN:  No Specimen  COUNTS CORRECT:  YES  PROCEDURE DETAILS: The procedure was performed at the bedside in the 3 mid [place] intensive care unit. Both a tracheostomy and a percutaneous endoscopic gastrostomy tube replaced. The patient will be documented separately.  The patient's neck was prepped and draped in usual sterile manner. A roll was placed underneath the shoulders. A proper timeout was performed identifying the patient and procedure to be performed. We prepped with ChloraPrep.  We anesthetized the area with lidocaine and made a transverse incision using #15 blade. We sharply and bluntly dissected down to the pretracheal fascia is subsequently identified the second tracheal ring. As endotracheal tube was pulled back we inserted an angiocatheter into the tracheal lumen aspirating air. We subsequently passed a green wire down the tracheal lumen followed by a short blue dilator and then the blue Rhino large dilator. Once the tracheotomy was enlarged to the proper size and #8 tracheostomy tube was passed over the guide into the tracheotomy and secured in place.  There was excellent placement as determined by bronchoscopy through the endotracheal tube and also through the tracheostomy itself.  The tracheostomy tube was secured in place with sutures in the tracheal ties. Postoperatively the  patient bled around the site as he had been on aspirin and Plavix chronically for recent cerebrovascular event.  All counts were correct.  PATIENT DISPOSITION:  In the ICU stable on the ventilator.   Elise Gladden 3/26/20185:25 PM
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
## 2017-02-09 NOTE — Progress Notes (Signed)
Patient FIO2 increased to 100% pre and post tracheostomy.

## 2017-02-09 NOTE — Op Note (Signed)
[facility] Patient Name: Arthur Beasley Procedure Date : 02/09/2017 MRN: [id number] Attending MD: Rikki Spearing, MD Date of Birth: Jan 13, 1981 CSN: [id number] Age: 36 Admit Type: Inpatient Procedure:                Upper GI endoscopy Indications:              Place PEG because patient is unable to eat, Place                            PEG due to neurological disorder causing impaired                            swallowing, Place PEG because patient is unable to                            eat due to stroke (CVA), Place PEG because patient                            requires ventilator support Providers:                Rikki Spearing, MD, Corliss Parish, Technician Referring MD:              Medicines:                Fentanyl 100 micrograms IV, Midazolam 4 mg IV Complications:            No immediate complications. Estimated Blood Loss:      Procedure:                Pre-Anesthesia Assessment:                           - Prior to the procedure, a History and Physical                            was performed, and patient medications and                            allergies were reviewed. The patient is unable to                            give consent secondary to the patient's altered                            mental status. The risks and benefits of the                            procedure and the sedation options and risks were                            discussed with the patient's sister. All questions                            were answered and informed consent was obtained.  Patient identification and proposed procedure were                            verified by the nurse Patient's Room. Mental Status                            Examination: alert but confused. Airway                            Examination: tracheostomy via ventilator.                            Respiratory Examination: clear to auscultation. CV         Examination: normal. ASA Grade Assessment: III - A                            patient with severe systemic disease. After                            reviewing the risks and benefits, the patient was                            deemed in satisfactory condition to undergo the                            procedure. The anesthesia plan was to use moderate                            sedation / analgesia (conscious sedation).                            Immediately prior to administration of medications,                            the patient was re-assessed for adequacy to receive                            sedatives. The heart rate, respiratory rate, oxygen                            saturations, blood pressure, adequacy of pulmonary                            ventilation, and response to care were monitored                            throughout the procedure. The physical status of                            the patient was re-assessed after the procedure.                           After obtaining informed consent, the endoscope was  passed under direct vision. Throughout the                            procedure, the patient's blood pressure, pulse, and                            oxygen saturations were monitored continuously. The                            EG-[id number] ([id number]) scope was introduced through the                            mouth, and advanced to the second part of duodenum.                            The upper GI endoscopy was accomplished without                            difficulty. The patient tolerated the procedure                            well. The total duration of the procedure was 17                            minutes. Scope In: Scope Out: Findings:      The esophagus was normal.      The stomach was normal. The patient was placed in the supine position       for PEG placement. The stomach was insufflated to appose gastric and        abdominal walls. A site was located in the body of the stomach with       excellent transillumination for placement. The abdominal wall was marked       and prepped in a sterile manner. The area was anesthetized with 3 mL of       0.5% lidocaine. The trocar needle was introduced through the abdominal       wall and into the stomach under direct endoscopic view. A snare was       introduced through the endoscope and opened in the gastric lumen. The       guide wire was passed through the trocar and into the open snare. The       snare was closed around the guide wire. The endoscope and snare were       removed, pulling the wire out through the mouth. A skin incision was       made at the site of needle insertion. The externally removable 24 Fr       Bard gastrostomy tube was lubricated. The G-tube was tied to the guide       wire and pulled through the mouth and into the stomach. The trocar       needle was removed, and the gastrostomy tube was pulled out from the       stomach through the skin. The external bumper was attached to the       gastrostomy tube, and the tube was cut to remove the guide wire. The       final position of  the gastrostomy tube was confirmed by relook       endoscopy, and skin marking noted to be 3 cm at the external bumper. The       final tension and compression of the abdominal wall by the PEG tube and       external bumper were checked and revealed that the bumper was moderately       tight and mildly deforming the skin and that the PEG balloon was       moderately tight and mildly compressing the stomach. The feeding tube       was capped, and the tube site cleaned and dressed. Estimated blood loss       was minimal.      The examined duodenum was normal. Impression:               - Normal esophagus.                           - Normal stomach.                           - Normal examined duodenum.                           - No specimens collected.                            - Normal gastroesophageal junction. Recommendation:           - Please follow the post-PEG recommendations                            including: start using PEG today and antibiotic                            ointment to site. Procedure Code(s):        --- Professional ---                           [phone], Esophagogastroduodenoscopy, flexible,                            transoral; with directed placement of percutaneous                            gastrostomy tube Diagnosis Code(s):        --- Professional ---                           R63.3, Feeding difficulties                           Z43.1, Encounter for attention to gastrostomy                           R29.818, Other symptoms and signs involving the                            nervous system  R13.10, Dysphagia, unspecified                           I69.398, Other sequelae of cerebral infarction                           Z99.11, Dependence on respirator [ventilator] status CPT copyright 2016 [employer]. All rights reserved. The codes documented in this report are preliminary and upon coder review may  be revised to meet current compliance requirements. Rikki Spearing, MD Judeth Horn III, MD 02/09/2017 6:20:27 PM This report has been signed electronically. Number of Addenda: 0

## 2017-02-09 NOTE — Progress Notes (Signed)
SLP Cancellation Note  Patient Details Name: Arthur Beasley MRN: [id number] DOB: 1981/04/17   Cancelled treatment: Pt currently receiving trach in room. ST will continue efforts for aphasia tx.          Fransisca Kaufmann , [place] 02/09/2017, 2:08 PM
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
## 2017-02-10 ENCOUNTER — Inpatient Hospital Stay (HOSPITAL_COMMUNITY): Payer: Medicaid Other

## 2017-02-10 ENCOUNTER — Encounter (HOSPITAL_COMMUNITY): Payer: Self-pay | Admitting: General Surgery

## 2017-02-10 LAB — GLUCOSE, CAPILLARY
GLUCOSE-CAPILLARY: 135 mg/dL — AB (ref 65–99)
GLUCOSE-CAPILLARY: 131 mg/dL — AB (ref 65–99)
GLUCOSE-CAPILLARY: 116 mg/dL — AB (ref 65–99)
Glucose-Capillary: 126 mg/dL — ABNORMAL HIGH (ref 65–99)
Glucose-Capillary: 126 mg/dL — ABNORMAL HIGH (ref 65–99)
Glucose-Capillary: 140 mg/dL — ABNORMAL HIGH (ref 65–99)

## 2017-02-10 LAB — BASIC METABOLIC PANEL
Anion gap: 7 (ref 5–15)
BUN: 18 mg/dL (ref 6–20)
CHLORIDE: 105 mmol/L (ref 101–111)
CO2: 26 mmol/L (ref 22–32)
CREATININE: 0.58 mg/dL — AB (ref 0.61–1.24)
Calcium: 8.6 mg/dL — ABNORMAL LOW (ref 8.9–10.3)
GFR calc Af Amer: >60 mL/min (ref >60)
GFR calc non Af Amer: >60 mL/min (ref >60)
GLUCOSE: 143 mg/dL — AB (ref 65–99)
POTASSIUM: 3.7 mmol/L (ref 3.5–5.1)
Sodium: 138 mmol/L (ref 135–145)

## 2017-02-10 LAB — CBC
HEMATOCRIT: 27.3 % — AB (ref 39.0–52.0)
HEMOGLOBIN: 9.1 g/dL — AB (ref 13.0–17.0)
MCH: 29.6 pg (ref 26.0–34.0)
MCHC: 33.3 g/dL (ref 30.0–36.0)
MCV: 88.9 fL (ref 78.0–100.0)
Platelets: 639 10*3/uL — ABNORMAL HIGH (ref 150–400)
RBC: 3.07 MIL/uL — AB (ref 4.22–5.81)
RDW: 13.4 % (ref 11.5–15.5)
WBC: 12.8 10*3/uL — ABNORMAL HIGH (ref 4.0–10.5)
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
## 2017-02-11 ENCOUNTER — Inpatient Hospital Stay (HOSPITAL_COMMUNITY): Payer: Medicaid Other

## 2017-02-11 DIAGNOSIS — I7771 Dissection of carotid artery: Secondary | ICD-10-CM

## 2017-02-11 LAB — BLOOD GAS, ARTERIAL
Acid-base deficit: 1.9 mmol/L (ref 0.0–2.0)
BICARBONATE: 21.9 mmol/L (ref 20.0–28.0)
DRAWN BY: 25203
FIO2: 40
O2 Saturation: 99.4 %
PATIENT TEMPERATURE: 98.6
PEEP: 5 cmH2O
RATE: 13 resp/min
pCO2 arterial: 34.3 mmHg (ref 32.0–48.0)
pH, Arterial: 7.42 (ref 7.350–7.450)
pO2, Arterial: 183 mmHg — ABNORMAL HIGH (ref 83.0–108.0)

## 2017-02-11 LAB — BASIC METABOLIC PANEL
ANION GAP: 9 (ref 5–15)
BUN: 19 mg/dL (ref 6–20)
CALCIUM: 8.5 mg/dL — AB (ref 8.9–10.3)
CO2: 27 mmol/L (ref 22–32)
Chloride: 102 mmol/L (ref 101–111)
Creatinine, Ser: 0.52 mg/dL — ABNORMAL LOW (ref 0.61–1.24)
Glucose, Bld: 116 mg/dL — ABNORMAL HIGH (ref 65–99)
POTASSIUM: 3.9 mmol/L (ref 3.5–5.1)
Sodium: 138 mmol/L (ref 135–145)

## 2017-02-11 LAB — CBC
HEMATOCRIT: 24.4 % — AB (ref 39.0–52.0)
Hemoglobin: 8 g/dL — ABNORMAL LOW (ref 13.0–17.0)
MCH: 29.1 pg (ref 26.0–34.0)
MCHC: 32.8 g/dL (ref 30.0–36.0)
MCV: 88.7 fL (ref 78.0–100.0)
PLATELETS: 583 10*3/uL — AB (ref 150–400)
RBC: 2.75 MIL/uL — AB (ref 4.22–5.81)
RDW: 13.1 % (ref 11.5–15.5)
WBC: 11.2 10*3/uL — AB (ref 4.0–10.5)

## 2017-02-11 LAB — GLUCOSE, CAPILLARY
Glucose-Capillary: 148 mg/dL — ABNORMAL HIGH (ref 65–99)
Glucose-Capillary: 147 mg/dL — ABNORMAL HIGH (ref 65–99)
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
## 2017-02-11 NOTE — Progress Notes (Signed)
Pt placed on 40% ATC and tolerating well at this time.  RT will continue to monitor.
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
## 2017-02-12 ENCOUNTER — Encounter (HOSPITAL_COMMUNITY): Payer: Self-pay | Admitting: Plastic Surgery

## 2017-02-12 ENCOUNTER — Inpatient Hospital Stay (HOSPITAL_COMMUNITY): Payer: Medicaid Other | Admitting: Certified Registered Nurse Anesthetist

## 2017-02-12 ENCOUNTER — Encounter (HOSPITAL_COMMUNITY): Admission: EM | Disposition: A | Discharge status: Rehabilitation Facility | Payer: Self-pay | Source: Home / Self Care

## 2017-02-12 DIAGNOSIS — S02602A Fracture of unspecified part of body of left mandible, initial encounter for closed fracture: Secondary | ICD-10-CM

## 2017-02-12 HISTORY — PX: ORIF MANDIBULAR FRACTURE: SHX2127

## 2017-02-12 LAB — GLUCOSE, CAPILLARY
GLUCOSE-CAPILLARY: 116 mg/dL — AB (ref 65–99)
GLUCOSE-CAPILLARY: 122 mg/dL — AB (ref 65–99)
GLUCOSE-CAPILLARY: 125 mg/dL — AB (ref 65–99)
GLUCOSE-CAPILLARY: 129 mg/dL — AB (ref 65–99)
GLUCOSE-CAPILLARY: 131 mg/dL — AB (ref 65–99)
Glucose-Capillary: 105 mg/dL — ABNORMAL HIGH (ref 65–99)

## 2017-02-12 SURGERY — Surgical Case
Anesthesia: *Unknown

## 2017-02-12 SURGERY — OPEN REDUCTION INTERNAL FIXATION (ORIF) MANDIBULAR FRACTURE
Anesthesia: General | Site: Face

## 2017-02-12 MED ORDER — VANCOMYCIN HCL IN DEXTROSE 1-5 GM/200ML-% IV SOLN
1000.0000 mg | Freq: Once | INTRAVENOUS | Status: AC
  Administered 2017-02-12: 1000 mg via INTRAVENOUS
  Filled 2017-02-12: qty 200

## 2017-02-12 MED ORDER — PHENYLEPHRINE 40 MCG/ML (10ML) SYRINGE FOR IV PUSH (FOR BLOOD PRESSURE SUPPORT)
PREFILLED_SYRINGE | INTRAVENOUS | Status: DC | PRN
  Administered 2017-02-12 (×3): 40 ug via INTRAVENOUS

## 2017-02-12 MED ORDER — MIDAZOLAM HCL 2 MG/2ML IJ SOLN
INTRAMUSCULAR | Status: AC
  Filled 2017-02-12: qty 2

## 2017-02-12 MED ORDER — 0.9 % SODIUM CHLORIDE (POUR BTL) OPTIME
TOPICAL | Status: DC | PRN
  Administered 2017-02-12: 1000 mL

## 2017-02-12 MED ORDER — CHLORHEXIDINE GLUCONATE CLOTH 2 % EX PADS: 6.0000 | MEDICATED_PAD | Freq: Once | CUTANEOUS | Status: DC

## 2017-02-12 MED ORDER — PIVOT 1.5 CAL PO LIQD
1000.0000 mL | ORAL | Status: DC
  Administered 2017-02-13 – 2017-02-15 (×3): 1000 mL
  Filled 2017-02-12 (×9): qty 1000

## 2017-02-12 MED ORDER — LACTATED RINGERS IV SOLN
INTRAVENOUS | Status: DC | PRN
  Administered 2017-02-12: 07:00:00 via INTRAVENOUS

## 2017-02-12 MED ORDER — CHLORHEXIDINE GLUCONATE CLOTH 2 % EX PADS
6.0000 | MEDICATED_PAD | Freq: Every day | CUTANEOUS | Status: DC
  Administered 2017-02-12 – 2017-02-19 (×6): 6 via TOPICAL

## 2017-02-12 MED ORDER — CEFAZOLIN SODIUM-DEXTROSE 2-4 GM/100ML-% IV SOLN
2.0000 g | INTRAVENOUS | Status: DC
  Filled 2017-02-12: qty 100

## 2017-02-12 MED ORDER — SODIUM CHLORIDE 0.9 % IV SOLN
500.0000 mg | Freq: Three times a day (TID) | INTRAVENOUS | Status: DC
  Administered 2017-02-12 – 2017-02-14 (×5): 500 mg via INTRAVENOUS
  Filled 2017-02-12 (×6): qty 500

## 2017-02-12 MED ORDER — CEFAZOLIN SODIUM 1 G IJ SOLR
INTRAMUSCULAR | Status: DC | PRN
  Administered 2017-02-12: 2 g via INTRAMUSCULAR

## 2017-02-12 MED ORDER — ROCURONIUM BROMIDE 10 MG/ML (PF) SYRINGE
PREFILLED_SYRINGE | INTRAVENOUS | Status: DC | PRN
  Administered 2017-02-12: 20 mg via INTRAVENOUS
  Administered 2017-02-12: 50 mg via INTRAVENOUS

## 2017-02-12 MED ORDER — PIPERACILLIN-TAZOBACTAM 3.375 G IVPB
3.3750 g | Freq: Three times a day (TID) | INTRAVENOUS | Status: AC
  Administered 2017-02-12 – 2017-02-17 (×15): 3.375 g via INTRAVENOUS
  Filled 2017-02-12 (×17): qty 50

## 2017-02-12 MED ORDER — FENTANYL CITRATE (PF) 250 MCG/5ML IJ SOLN
INTRAMUSCULAR | Status: AC
  Filled 2017-02-12: qty 5

## 2017-02-12 MED ORDER — PHENYLEPHRINE HCL 10 MG/ML IJ SOLN
INTRAMUSCULAR | Status: DC | PRN
  Administered 2017-02-12: 20 ug/min via INTRAVENOUS

## 2017-02-12 MED ORDER — PROPOFOL 500 MG/50ML IV EMUL
INTRAVENOUS | Status: DC | PRN
  Administered 2017-02-12: 25 ug/kg/min via INTRAVENOUS

## 2017-02-12 MED ORDER — PROPOFOL 10 MG/ML IV BOLUS
INTRAVENOUS | Status: AC
  Filled 2017-02-12: qty 60

## 2017-02-12 MED ORDER — LIDOCAINE-EPINEPHRINE 2 %-1:100000 IJ SOLN
INTRAMUSCULAR | Status: DC | PRN
  Administered 2017-02-12: .9 mL via INTRADERMAL

## 2017-02-12 MED ORDER — OXYMETAZOLINE HCL 0.05 % NA SOLN
NASAL | Status: DC | PRN
Start: 2017-02-12 — End: 2017-02-12
  Administered 2017-02-12: 1 via TOPICAL

## 2017-02-12 MED ORDER — PROPOFOL 10 MG/ML IV BOLUS
INTRAVENOUS | Status: DC | PRN
  Administered 2017-02-12: 50 mg via INTRAVENOUS

## 2017-02-12 MED ORDER — MIDAZOLAM HCL 5 MG/5ML IJ SOLN
INTRAMUSCULAR | Status: DC | PRN
  Administered 2017-02-12 (×2): 2 mg via INTRAVENOUS

## 2017-02-12 MED ORDER — OXYMETAZOLINE HCL 0.05 % NA SOLN
NASAL | Status: AC
  Filled 2017-02-12: qty 15

## 2017-02-12 MED ORDER — ENOXAPARIN SODIUM 40 MG/0.4ML ~~LOC~~ SOLN
40.0000 mg | SUBCUTANEOUS | Status: DC
  Administered 2017-02-13 – 2017-02-19 (×7): 40 mg via SUBCUTANEOUS
  Filled 2017-02-12 (×7): qty 0.4

## 2017-02-12 MED ORDER — FENTANYL CITRATE (PF) 100 MCG/2ML IJ SOLN
INTRAMUSCULAR | Status: DC | PRN
  Administered 2017-02-12 (×2): 50 ug via INTRAVENOUS

## 2017-02-12 MED ORDER — LIDOCAINE-EPINEPHRINE 2 %-1:100000 IJ SOLN
INTRAMUSCULAR | Status: AC
  Filled 2017-02-12: qty 1

## 2017-02-12 SURGICAL SUPPLY — 42 items
BLADE 10 SAFETY STRL DISP (BLADE) ×3 IMPLANT
BLADE SURG 15 STRL LF DISP TIS (BLADE) ×1 IMPLANT
BLADE SURG 15 STRL SS (BLADE) ×3
CANISTER SUCT 3000ML PPV (MISCELLANEOUS) ×3 IMPLANT
CLEANER TIP ELECTROSURG 2X2 (MISCELLANEOUS) ×3 IMPLANT
COVER SURGICAL LIGHT HANDLE (MISCELLANEOUS) ×3 IMPLANT
CRADLE DONUT ADULT HEAD (MISCELLANEOUS) ×3 IMPLANT
DECANTER SPIKE VIAL GLASS SM (MISCELLANEOUS) ×2 IMPLANT
ELECT COATED BLADE 2.86 ST (ELECTRODE) ×3 IMPLANT
ELECT REM PT RETURN 9FT ADLT (ELECTROSURGICAL) ×3
ELECTRODE REM PT RTRN 9FT ADLT (ELECTROSURGICAL) ×1 IMPLANT
GLOVE BIO SURGEON STRL SZ 6.5 (GLOVE) ×2 IMPLANT
GLOVE BIO SURGEONS STRL SZ 6.5 (GLOVE) ×1
GOWN STRL REUS W/ TWL LRG LVL3 (GOWN DISPOSABLE) ×2 IMPLANT
GOWN STRL REUS W/TWL LRG LVL3 (GOWN DISPOSABLE) ×6
KIT BASIN OR (CUSTOM PROCEDURE TRAY) ×3 IMPLANT
KIT ROOM TURNOVER OR (KITS) ×3 IMPLANT
NDL PRECISIONGLIDE 27X1.5 (NEEDLE) ×1 IMPLANT
NEEDLE PRECISIONGLIDE 27X1.5 (NEEDLE) ×3 IMPLANT
NS IRRIG 1000ML POUR BTL (IV SOLUTION) ×3 IMPLANT
PAD ARMBOARD 7.5X6 YLW CONV (MISCELLANEOUS) ×6 IMPLANT
PENCIL BUTTON HOLSTER BLD 10FT (ELECTRODE) ×3 IMPLANT
PLATE HYBRID MMF (Plate) ×2 IMPLANT
PROTECTOR CORNEAL (OPHTHALMIC RELATED) IMPLANT
SCISSORS WIRE ANG 4 3/4 DISP (INSTRUMENTS) ×3 IMPLANT
SCREW LOCK SELFDRIL 2.0X8M MMF (Screw) ×2 IMPLANT
SCREW LOCKING SELF DRILL 2.0X6 (Screw) ×16 IMPLANT
SPLINT NASAL DOYLE BI-VL (GAUZE/BANDAGES/DRESSINGS) IMPLANT
SUT MON AB 3-0 SH 27 (SUTURE) ×6
SUT MON AB 3-0 SH27 (SUTURE) ×2 IMPLANT
SUT PROLENE 6 0 PC 1 (SUTURE) IMPLANT
SUT STEEL 0 (SUTURE)
SUT STEEL 0 18XMFL TIE 17 (SUTURE) IMPLANT
SUT STEEL 1 (SUTURE) IMPLANT
SUT STEEL 2 (SUTURE) IMPLANT
SUT STEEL 4 (SUTURE) IMPLANT
SUT VIC AB 3-0 PS2 18 (SUTURE) ×4 IMPLANT
SUT VICRYL 4-0 PS2 18IN ABS (SUTURE) IMPLANT
TOWEL OR 17X24 6PK STRL BLUE (TOWEL DISPOSABLE) ×3 IMPLANT
TOWEL OR 17X26 10 PK STRL BLUE (TOWEL DISPOSABLE) ×3 IMPLANT
TRAY ENT MC OR (CUSTOM PROCEDURE TRAY) ×3 IMPLANT
YANKAUER SUCT BULB TIP NO VENT (SUCTIONS) ×2 IMPLANT

## 2017-02-12 NOTE — Transfer of Care (Signed)
Immediate Anesthesia Transfer of Care Note  Patient: Arthur Beasley  Procedure(s) Performed: Procedure(s): OPEN REDUCTION INTERNAL FIXATION (ORIF) MANDIBULAR FRACTURE WITH MAXILLARY MANDIBULAR FIXATION (N/A)  Patient Location: NICU  Anesthesia Type:General  Level of Consciousness: sedated and Patient remains intubated per anesthesia plan  Airway & Oxygen Therapy: Patient remains intubated per anesthesia plan and Patient placed on Ventilator (see vital sign flow sheet for setting)  Post-op Assessment: Report given to RN and Post -op Vital signs reviewed and stable  Post vital signs: Reviewed and stable  Last Vitals:  Vitals:   02/12/17 0600 02/12/17 0700  BP: (!) 143/97 (!) 147/106  Pulse: (!) 113 (!) 129  Resp: 17 (!) 25  Temp:      Last Pain:  Vitals:   02/12/17 0533  TempSrc:   PainSc: Asleep      Patients Stated Pain Goal: 3 (03/00/92 3300)  Complications: No apparent anesthesia complications    Pt VSS during transport to ICU. Pt met my ICU RN and RT. Report given to RN/RT and all questions answered. Pt connected to ICU monitors and continued stability confirmed.
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
## 2017-02-12 NOTE — Progress Notes (Signed)
Pt back from OR and placed on vent.  RT will attempt to place pt back on ATC once more awake.  RT will continue to monitor.
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
## 2017-02-12 NOTE — Progress Notes (Signed)
PT Cancellation Note  Patient Details Name: Arthur Beasley MRN: [id number] DOB: 1981/05/12   Cancelled Treatment:    Reason Eval/Treat Not Completed: Patient at procedure or test/unavailable.  Pt to OR today.  Will f/u another time.     [place], [place] 02/12/2017, 8:06 AM
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
## 2017-02-13 LAB — BASIC METABOLIC PANEL
Anion gap: 7 (ref 5–15)
BUN: 16 mg/dL (ref 6–20)
CALCIUM: 8.1 mg/dL — AB (ref 8.9–10.3)
CHLORIDE: 102 mmol/L (ref 101–111)
CO2: 28 mmol/L (ref 22–32)
Creatinine, Ser: 0.55 mg/dL — ABNORMAL LOW (ref 0.61–1.24)
GFR calc non Af Amer: >60 mL/min (ref >60)
Glucose, Bld: 114 mg/dL — ABNORMAL HIGH (ref 65–99)
Potassium: 3.7 mmol/L (ref 3.5–5.1)
Sodium: 137 mmol/L (ref 135–145)

## 2017-02-13 LAB — GLUCOSE, CAPILLARY
GLUCOSE-CAPILLARY: 142 mg/dL — AB (ref 65–99)
GLUCOSE-CAPILLARY: 141 mg/dL — AB (ref 65–99)
Glucose-Capillary: 120 mg/dL — ABNORMAL HIGH (ref 65–99)
Glucose-Capillary: 123 mg/dL — ABNORMAL HIGH (ref 65–99)
Glucose-Capillary: 127 mg/dL — ABNORMAL HIGH (ref 65–99)
Glucose-Capillary: 148 mg/dL — ABNORMAL HIGH (ref 65–99)

## 2017-02-13 MED ORDER — ASPIRIN 81 MG PO CHEW
81.0000 mg | CHEWABLE_TABLET | Freq: Every day | ORAL | Status: DC
  Administered 2017-02-13 – 2017-02-19 (×7): 81 mg via ORAL
  Filled 2017-02-13 (×7): qty 1

## 2017-02-13 MED ORDER — CLOPIDOGREL BISULFATE 75 MG PO TABS
75.0000 mg | ORAL_TABLET | Freq: Every day | ORAL | Status: DC
  Administered 2017-02-13 – 2017-02-19 (×7): 75 mg via ORAL
  Filled 2017-02-13 (×7): qty 1
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
## 2017-02-13 NOTE — Progress Notes (Signed)
Stopped by to visit but pt asleep and no family were present. Per nurse, pt may be stable enough to transfer out to another floor tomorrow. Chaplain available for f/u.    02/13/17 1100  Clinical Encounter Type  Visited With Patient not available  Visit Type Follow-up;Psychological support;Spiritual support;Social support;Critical Care  Referral From [place] Rhea Thrun, [place]
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
## 2017-02-13 NOTE — Anesthesia Postprocedure Evaluation (Signed)
Anesthesia Post Note  Patient: Arthur Beasley  Procedure(s) Performed: Procedure(s) (LRB): OPEN REDUCTION INTERNAL FIXATION (ORIF) MANDIBULAR FRACTURE WITH MAXILLARY MANDIBULAR FIXATION (N/A)  Patient location during evaluation: ICU Anesthesia Type: General Level of consciousness: sedated and patient cooperative Pain management: pain level controlled Vital Signs Assessment: post-procedure vital signs reviewed and stable Respiratory status: spontaneous breathing Cardiovascular status: stable Anesthetic complications: no       Last Vitals:  Vitals:   02/13/17 0600 02/13/17 0739  BP: 114/84   Pulse: (!) 109 (!) 116  Resp: 13 16  Temp:      Last Pain:  Vitals:   02/13/17 0400  TempSrc: Axillary  PainSc:                  [name]
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
## 2017-02-14 LAB — URINE CULTURE
Culture: >=100000 — AB
SPECIAL REQUESTS: NORMAL

## 2017-02-14 LAB — GLUCOSE, CAPILLARY
GLUCOSE-CAPILLARY: 127 mg/dL — AB (ref 65–99)
GLUCOSE-CAPILLARY: 122 mg/dL — AB (ref 65–99)
GLUCOSE-CAPILLARY: 137 mg/dL — AB (ref 65–99)
GLUCOSE-CAPILLARY: 114 mg/dL — AB (ref 65–99)
Glucose-Capillary: 119 mg/dL — ABNORMAL HIGH (ref 65–99)
Glucose-Capillary: 113 mg/dL — ABNORMAL HIGH (ref 65–99)
Glucose-Capillary: 144 mg/dL — ABNORMAL HIGH (ref 65–99)

## 2017-02-14 MED ORDER — FREE WATER
250.0000 mL | Freq: Four times a day (QID) | Status: DC
  Administered 2017-02-14 – 2017-02-17 (×13): 250 mL
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
## 2017-02-14 NOTE — Progress Notes (Signed)
Password changed by pt's sisters to 20. Consuelo Pandy RN

## 2017-02-15 LAB — CBC
HEMATOCRIT: 24.7 % — AB (ref 39.0–52.0)
HEMOGLOBIN: 8.3 g/dL — AB (ref 13.0–17.0)
MCH: 28.9 pg (ref 26.0–34.0)
MCHC: 33.6 g/dL (ref 30.0–36.0)
MCV: 86.1 fL (ref 78.0–100.0)
Platelets: 641 10*3/uL — ABNORMAL HIGH (ref 150–400)
RBC: 2.87 MIL/uL — AB (ref 4.22–5.81)
RDW: 12.9 % (ref 11.5–15.5)
WBC: 7.9 10*3/uL (ref 4.0–10.5)

## 2017-02-15 LAB — BASIC METABOLIC PANEL
ANION GAP: 8 (ref 5–15)
BUN: 12 mg/dL (ref 6–20)
CHLORIDE: 99 mmol/L — AB (ref 101–111)
CO2: 29 mmol/L (ref 22–32)
Calcium: 8.6 mg/dL — ABNORMAL LOW (ref 8.9–10.3)
Creatinine, Ser: 0.51 mg/dL — ABNORMAL LOW (ref 0.61–1.24)
GFR calc non Af Amer: >60 mL/min (ref >60)
Glucose, Bld: 120 mg/dL — ABNORMAL HIGH (ref 65–99)
POTASSIUM: 3.6 mmol/L (ref 3.5–5.1)
SODIUM: 136 mmol/L (ref 135–145)

## 2017-02-15 LAB — CULTURE, RESPIRATORY W GRAM STAIN: Culture: NORMAL

## 2017-02-15 LAB — GLUCOSE, CAPILLARY
GLUCOSE-CAPILLARY: 143 mg/dL — AB (ref 65–99)
GLUCOSE-CAPILLARY: 141 mg/dL — AB (ref 65–99)
GLUCOSE-CAPILLARY: 128 mg/dL — AB (ref 65–99)
Glucose-Capillary: 106 mg/dL — ABNORMAL HIGH (ref 65–99)
Glucose-Capillary: 110 mg/dL — ABNORMAL HIGH (ref 65–99)
Glucose-Capillary: 139 mg/dL — ABNORMAL HIGH (ref 65–99)

## 2017-02-15 LAB — C DIFFICILE QUICK SCREEN W PCR REFLEX
C DIFFICILE (CDIFF) INTERP: NOT DETECTED
C DIFFICILE (CDIFF) TOXIN: NEGATIVE
C DIFFICLE (CDIFF) ANTIGEN: NEGATIVE

## 2017-02-15 LAB — CULTURE, RESPIRATORY: SPECIAL REQUESTS: NORMAL

## 2017-02-15 MED ORDER — WHITE PETROLATUM GEL
Status: AC
  Administered 2017-02-15: 12:00:00
  Filled 2017-02-15: qty 1

## 2017-02-15 MED ORDER — CALCIUM POLYCARBOPHIL 625 MG PO TABS
625.0000 mg | ORAL_TABLET | Freq: Every day | ORAL | Status: DC
  Administered 2017-02-15 – 2017-02-19 (×5): 625 mg via ORAL
  Filled 2017-02-15 (×5): qty 1
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
## 2017-02-16 ENCOUNTER — Encounter (HOSPITAL_COMMUNITY): Payer: Self-pay | Admitting: Plastic Surgery

## 2017-02-16 DIAGNOSIS — G8191 Hemiplegia, unspecified affecting right dominant side: Secondary | ICD-10-CM

## 2017-02-16 LAB — GLUCOSE, CAPILLARY
GLUCOSE-CAPILLARY: 119 mg/dL — AB (ref 65–99)
GLUCOSE-CAPILLARY: 112 mg/dL — AB (ref 65–99)
Glucose-Capillary: 134 mg/dL — ABNORMAL HIGH (ref 65–99)
Glucose-Capillary: 117 mg/dL — ABNORMAL HIGH (ref 65–99)
Glucose-Capillary: 101 mg/dL — ABNORMAL HIGH (ref 65–99)

## 2017-02-16 MED ORDER — CHLORHEXIDINE GLUCONATE 0.12 % MT SOLN
15.0000 mL | Freq: Two times a day (BID) | OROMUCOSAL | Status: DC
  Administered 2017-02-16 – 2017-02-19 (×6): 15 mL via OROMUCOSAL
  Filled 2017-02-16 (×6): qty 15

## 2017-02-16 MED ORDER — ORAL CARE MOUTH RINSE
15.0000 mL | Freq: Two times a day (BID) | OROMUCOSAL | Status: DC
  Administered 2017-02-16 – 2017-02-19 (×7): 15 mL via OROMUCOSAL

## 2017-02-16 NOTE — Progress Notes (Signed)
Patient has been hospitalized since 01/27/17. He has been unable to communicate, sign papers, or make phone calls during this time. Staff has been unable to assess patient's orientation or cognitive abilities due to his inability to communicate.   Joellen Jersey, RN.

## 2017-02-16 NOTE — Plan of Care (Signed)
Problem: Health Behavior/Discharge Planning: Goal: Ability to manage health-related needs will improve Outcome: Progressing Patient consulted for CIR.   Problem: Activity: Goal: Risk for activity intolerance will decrease Outcome: Progressing PT/OT consulted. Patient up in chair yesterday 02/15/17.  Problem: Fluid Volume: Goal: Ability to maintain a balanced intake and output will improve Outcome: Progressing Patient receiving free water flushes via peg tube. Voiding appropriately.  Problem: Bowel/Gastric: Goal: Will not experience complications related to bowel motility Outcome: Progressing Patient has been having frequent loose stools while on tube feeds. Cdiff negative. Fiber supplements started by MD.
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
## 2017-02-17 LAB — CULTURE, BLOOD (ROUTINE X 2)
CULTURE: NO GROWTH
Culture: NO GROWTH

## 2017-02-17 LAB — GLUCOSE, CAPILLARY
GLUCOSE-CAPILLARY: 111 mg/dL — AB (ref 65–99)
GLUCOSE-CAPILLARY: 130 mg/dL — AB (ref 65–99)
Glucose-Capillary: 106 mg/dL — ABNORMAL HIGH (ref 65–99)
Glucose-Capillary: 106 mg/dL — ABNORMAL HIGH (ref 65–99)
Glucose-Capillary: 112 mg/dL — ABNORMAL HIGH (ref 65–99)
Glucose-Capillary: 131 mg/dL — ABNORMAL HIGH (ref 65–99)
Glucose-Capillary: 95 mg/dL (ref 65–99)

## 2017-02-17 MED ORDER — JEVITY 1.2 CAL PO LIQD
1000.0000 mL | ORAL | Status: DC
  Administered 2017-02-17 – 2017-02-18 (×2): 1000 mL
  Filled 2017-02-17 (×6): qty 1000

## 2017-02-17 MED ORDER — FREE WATER
200.0000 mL | Freq: Four times a day (QID) | Status: DC
  Administered 2017-02-17 – 2017-02-19 (×9): 200 mL

## 2017-02-17 NOTE — Progress Notes (Signed)
Rehab admissions - I met with patient and his sister, Josephine Cables.  I gave them rehab booklets.  Sister is familiar with CIR because her son was here this past year.  Sisters plan to care for patient after rehab.  Patient has a trach.  He is up in chair with therapies today and did well.  I think over the next few days as his trach secretions decrease, we should be able to admit to acute inpatient rehab.  Call me for questions.  #[phone]
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
## 2017-02-17 NOTE — Progress Notes (Signed)
Notified pharmacy of missing IV piggyback. Will send dose. Wendee Copp
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
## 2017-02-18 LAB — GLUCOSE, CAPILLARY
GLUCOSE-CAPILLARY: 119 mg/dL — AB (ref 65–99)
GLUCOSE-CAPILLARY: 123 mg/dL — AB (ref 65–99)
GLUCOSE-CAPILLARY: 91 mg/dL (ref 65–99)
Glucose-Capillary: 164 mg/dL — ABNORMAL HIGH (ref 65–99)
Glucose-Capillary: 122 mg/dL — ABNORMAL HIGH (ref 65–99)
Glucose-Capillary: 119 mg/dL — ABNORMAL HIGH (ref 65–99)
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
## 2017-02-18 NOTE — Progress Notes (Signed)
Letter prepared for pt's sister at her request, stating that pt unable to handle his own benefits.  Letter given to bedside nurse, who states she will give to sister when she visits this evening.    Reinaldo Raddle, RN, BSN  Trauma/Neuro ICU Case Manager [phone]

## 2017-02-18 NOTE — Progress Notes (Signed)
RT changed patient from O2 to humidified room air. RT will monitor pt.
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
## 2017-02-19 ENCOUNTER — Encounter (HOSPITAL_COMMUNITY): Payer: Self-pay | Admitting: *Deleted

## 2017-02-19 ENCOUNTER — Inpatient Hospital Stay (HOSPITAL_COMMUNITY)
Admission: RE | Admit: 2017-02-19 | Discharge: 2017-03-19 | DRG: 057 | Disposition: A | Discharge status: Home Health | Payer: Medicaid Other | Source: Intra-hospital | Attending: Physical Medicine & Rehabilitation | Admitting: Physical Medicine & Rehabilitation

## 2017-02-19 DIAGNOSIS — W19XXXD Unspecified fall, subsequent encounter: Secondary | ICD-10-CM

## 2017-02-19 DIAGNOSIS — F17210 Nicotine dependence, cigarettes, uncomplicated: Secondary | ICD-10-CM | POA: Diagnosis not present

## 2017-02-19 DIAGNOSIS — Z931 Gastrostomy status: Secondary | ICD-10-CM

## 2017-02-19 DIAGNOSIS — W19XXXA Unspecified fall, initial encounter: Secondary | ICD-10-CM

## 2017-02-19 DIAGNOSIS — I69390 Apraxia following cerebral infarction: Secondary | ICD-10-CM | POA: Diagnosis not present

## 2017-02-19 DIAGNOSIS — D75839 Thrombocytosis, unspecified: Secondary | ICD-10-CM

## 2017-02-19 DIAGNOSIS — R739 Hyperglycemia, unspecified: Secondary | ICD-10-CM | POA: Diagnosis not present

## 2017-02-19 DIAGNOSIS — Z930 Tracheostomy status: Secondary | ICD-10-CM

## 2017-02-19 DIAGNOSIS — R7989 Other specified abnormal findings of blood chemistry: Secondary | ICD-10-CM | POA: Diagnosis not present

## 2017-02-19 DIAGNOSIS — I7771 Dissection of carotid artery: Secondary | ICD-10-CM | POA: Diagnosis present

## 2017-02-19 DIAGNOSIS — I69320 Aphasia following cerebral infarction: Secondary | ICD-10-CM | POA: Diagnosis not present

## 2017-02-19 DIAGNOSIS — I69391 Dysphagia following cerebral infarction: Secondary | ICD-10-CM | POA: Diagnosis not present

## 2017-02-19 DIAGNOSIS — D473 Essential (hemorrhagic) thrombocythemia: Secondary | ICD-10-CM | POA: Diagnosis not present

## 2017-02-19 DIAGNOSIS — R1310 Dysphagia, unspecified: Secondary | ICD-10-CM | POA: Diagnosis not present

## 2017-02-19 DIAGNOSIS — R609 Edema, unspecified: Secondary | ICD-10-CM | POA: Diagnosis not present

## 2017-02-19 DIAGNOSIS — Z79899 Other long term (current) drug therapy: Secondary | ICD-10-CM | POA: Diagnosis not present

## 2017-02-19 DIAGNOSIS — D62 Acute posthemorrhagic anemia: Secondary | ICD-10-CM | POA: Diagnosis not present

## 2017-02-19 DIAGNOSIS — W3400XA Accidental discharge from unspecified firearms or gun, initial encounter: Secondary | ICD-10-CM

## 2017-02-19 DIAGNOSIS — I10 Essential (primary) hypertension: Secondary | ICD-10-CM

## 2017-02-19 DIAGNOSIS — I69351 Hemiplegia and hemiparesis following cerebral infarction affecting right dominant side: Principal | ICD-10-CM

## 2017-02-19 DIAGNOSIS — G939 Disorder of brain, unspecified: Secondary | ICD-10-CM

## 2017-02-19 DIAGNOSIS — Y249XXA Unspecified firearm discharge, undetermined intent, initial encounter: Secondary | ICD-10-CM

## 2017-02-19 DIAGNOSIS — G8191 Hemiplegia, unspecified affecting right dominant side: Secondary | ICD-10-CM

## 2017-02-19 DIAGNOSIS — I69319 Unspecified symptoms and signs involving cognitive functions following cerebral infarction: Secondary | ICD-10-CM | POA: Diagnosis not present

## 2017-02-19 DIAGNOSIS — R339 Retention of urine, unspecified: Secondary | ICD-10-CM | POA: Diagnosis not present

## 2017-02-19 DIAGNOSIS — S02609D Fracture of mandible, unspecified, subsequent encounter for fracture with routine healing: Secondary | ICD-10-CM

## 2017-02-19 DIAGNOSIS — S069XAA Unspecified intracranial injury with loss of consciousness status unknown, initial encounter: Secondary | ICD-10-CM | POA: Diagnosis present

## 2017-02-19 DIAGNOSIS — I63512 Cerebral infarction due to unspecified occlusion or stenosis of left middle cerebral artery: Secondary | ICD-10-CM | POA: Diagnosis not present

## 2017-02-19 DIAGNOSIS — R4701 Aphasia: Secondary | ICD-10-CM

## 2017-02-19 DIAGNOSIS — S069X9A Unspecified intracranial injury with loss of consciousness of unspecified duration, initial encounter: Secondary | ICD-10-CM | POA: Diagnosis present

## 2017-02-19 LAB — GLUCOSE, CAPILLARY
GLUCOSE-CAPILLARY: 99 mg/dL (ref 65–99)
GLUCOSE-CAPILLARY: 101 mg/dL — AB (ref 65–99)
Glucose-Capillary: 109 mg/dL — ABNORMAL HIGH (ref 65–99)
Glucose-Capillary: 120 mg/dL — ABNORMAL HIGH (ref 65–99)

## 2017-02-19 MED ORDER — SODIUM CHLORIDE 0.9% FLUSH
10.0000 mL | INTRAVENOUS | Status: DC | PRN
  Administered 2017-02-21: 20 mL
  Administered 2017-02-28 – 2017-03-01 (×4): 10 mL
  Administered 2017-03-02: 20 mL
  Administered 2017-03-04: 40 mL
  Filled 2017-02-19 (×7): qty 40

## 2017-02-19 MED ORDER — LEVETIRACETAM 500 MG PO TABS: 500.0000 mg | ORAL_TABLET | Freq: Two times a day (BID) | ORAL | Status: DC

## 2017-02-19 MED ORDER — BISACODYL 10 MG RE SUPP: 10.0000 mg | Freq: Every day | RECTAL | Status: DC | PRN

## 2017-02-19 MED ORDER — PANTOPRAZOLE SODIUM 40 MG PO PACK
40.0000 mg | PACK | Freq: Every day | ORAL | Status: DC
  Administered 2017-02-20 – 2017-03-15 (×22): 40 mg
  Filled 2017-02-19 (×21): qty 20

## 2017-02-19 MED ORDER — FREE WATER
200.0000 mL | Freq: Four times a day (QID) | Status: DC
  Administered 2017-02-20 – 2017-02-25 (×23): 200 mL

## 2017-02-19 MED ORDER — AMLODIPINE BESYLATE 5 MG PO TABS
5.0000 mg | ORAL_TABLET | Freq: Every day | ORAL | Status: DC
  Administered 2017-02-20 – 2017-03-11 (×20): 5 mg
  Filled 2017-02-19 (×20): qty 1

## 2017-02-19 MED ORDER — ENOXAPARIN SODIUM 40 MG/0.4ML ~~LOC~~ SOLN
40.0000 mg | SUBCUTANEOUS | Status: DC
  Administered 2017-02-20 – 2017-03-18 (×27): 40 mg via SUBCUTANEOUS
  Filled 2017-02-19 (×27): qty 0.4

## 2017-02-19 MED ORDER — POLYETHYLENE GLYCOL 3350 17 G PO PACK
17.0000 g | PACK | Freq: Every day | ORAL | Status: DC
  Administered 2017-02-20 – 2017-03-15 (×23): 17 g
  Filled 2017-02-19 (×24): qty 1

## 2017-02-19 MED ORDER — PROCHLORPERAZINE 25 MG RE SUPP: 12.5000 mg | Freq: Four times a day (QID) | RECTAL | Status: DC | PRN

## 2017-02-19 MED ORDER — POLYETHYLENE GLYCOL 3350 17 G PO PACK: 17.0000 g | PACK | Freq: Every day | ORAL | Status: DC | PRN

## 2017-02-19 MED ORDER — PROCHLORPERAZINE EDISYLATE 5 MG/ML IJ SOLN: 5.0000 mg | Freq: Four times a day (QID) | INTRAMUSCULAR | Status: DC | PRN

## 2017-02-19 MED ORDER — ALUM & MAG HYDROXIDE-SIMETH 200-200-20 MG/5ML PO SUSP: 30.0000 mL | ORAL | Status: DC | PRN

## 2017-02-19 MED ORDER — INSULIN ASPART 100 UNIT/ML ~~LOC~~ SOLN
0.0000 [IU] | Freq: Four times a day (QID) | SUBCUTANEOUS | Status: DC
  Administered 2017-02-19 – 2017-02-27 (×9): 1 [IU] via SUBCUTANEOUS

## 2017-02-19 MED ORDER — CHLORHEXIDINE GLUCONATE CLOTH 2 % EX PADS
6.0000 | MEDICATED_PAD | Freq: Every day | CUTANEOUS | Status: DC
  Administered 2017-02-20 – 2017-02-22 (×3): 6 via TOPICAL

## 2017-02-19 MED ORDER — CALCIUM POLYCARBOPHIL 625 MG PO TABS
625.0000 mg | ORAL_TABLET | Freq: Every day | ORAL | Status: DC
  Administered 2017-02-20 – 2017-03-15 (×21): 625 mg via ORAL
  Filled 2017-02-19 (×24): qty 1

## 2017-02-19 MED ORDER — DIPHENHYDRAMINE HCL 12.5 MG/5ML PO ELIX: 12.5000 mg | ORAL_SOLUTION | Freq: Four times a day (QID) | ORAL | Status: DC | PRN

## 2017-02-19 MED ORDER — ASPIRIN 81 MG PO CHEW
81.0000 mg | CHEWABLE_TABLET | Freq: Every day | ORAL | Status: DC
  Administered 2017-02-20 – 2017-03-16 (×24): 81 mg
  Filled 2017-02-19 (×26): qty 1

## 2017-02-19 MED ORDER — BETHANECHOL CHLORIDE 10 MG PO TABS
10.0000 mg | ORAL_TABLET | Freq: Three times a day (TID) | ORAL | Status: DC
  Administered 2017-02-19 – 2017-03-15 (×71): 10 mg
  Filled 2017-02-19 (×72): qty 1

## 2017-02-19 MED ORDER — CLOPIDOGREL BISULFATE 75 MG PO TABS
75.0000 mg | ORAL_TABLET | Freq: Every day | ORAL | Status: DC
  Administered 2017-02-20 – 2017-03-16 (×24): 75 mg
  Filled 2017-02-19 (×27): qty 1

## 2017-02-19 MED ORDER — GUAIFENESIN-DM 100-10 MG/5ML PO SYRP: 5.0000 mL | ORAL_SOLUTION | Freq: Four times a day (QID) | ORAL | Status: DC | PRN

## 2017-02-19 MED ORDER — CHLORHEXIDINE GLUCONATE 0.12 % MT SOLN
15.0000 mL | Freq: Two times a day (BID) | OROMUCOSAL | Status: DC
  Administered 2017-02-19 – 2017-03-16 (×35): 15 mL via OROMUCOSAL
  Filled 2017-02-19 (×43): qty 15

## 2017-02-19 MED ORDER — JEVITY 1.2 CAL PO LIQD
1000.0000 mL | ORAL | Status: DC
  Administered 2017-02-19 – 2017-02-20 (×2): 1000 mL
  Filled 2017-02-19 (×3): qty 1000

## 2017-02-19 MED ORDER — ORAL CARE MOUTH RINSE
15.0000 mL | Freq: Two times a day (BID) | OROMUCOSAL | Status: DC
  Administered 2017-02-20 – 2017-03-15 (×20): 15 mL via OROMUCOSAL

## 2017-02-19 MED ORDER — PROCHLORPERAZINE MALEATE 5 MG PO TABS: 5.0000 mg | ORAL_TABLET | Freq: Four times a day (QID) | ORAL | Status: DC | PRN

## 2017-02-19 MED ORDER — FLEET ENEMA 7-19 GM/118ML RE ENEM: 1.0000 | ENEMA | Freq: Once | RECTAL | Status: DC | PRN

## 2017-02-19 MED ORDER — LEVETIRACETAM 100 MG/ML PO SOLN
500.0000 mg | Freq: Two times a day (BID) | ORAL | Status: DC
  Administered 2017-02-19 – 2017-03-16 (×50): 500 mg
  Filled 2017-02-19 (×50): qty 5

## 2017-02-19 MED ORDER — SODIUM CHLORIDE 0.9% FLUSH
10.0000 mL | Freq: Two times a day (BID) | INTRAVENOUS | Status: DC
Start: 2017-02-19 — End: 2017-03-05
  Administered 2017-03-03: 10 mL

## 2017-02-19 MED ORDER — TRAZODONE HCL 50 MG PO TABS
25.0000 mg | ORAL_TABLET | Freq: Every evening | ORAL | Status: DC | PRN
  Administered 2017-02-27 – 2017-03-13 (×4): 50 mg via ORAL
  Filled 2017-02-19 (×7): qty 1

## 2017-02-19 MED ORDER — ADULT MULTIVITAMIN LIQUID CH
15.0000 mL | Freq: Every day | ORAL | Status: DC
  Administered 2017-02-20 – 2017-03-15 (×23): 15 mL
  Filled 2017-02-19 (×27): qty 15

## 2017-02-19 MED ORDER — HYDROCODONE-ACETAMINOPHEN 7.5-325 MG/15ML PO SOLN
15.0000 mL | ORAL | Status: DC | PRN
  Administered 2017-02-19 – 2017-03-02 (×12): 15 mL
  Filled 2017-02-19 (×12): qty 15

## 2017-02-19 MED ORDER — ACETAMINOPHEN 325 MG PO TABS: 325.0000 mg | ORAL_TABLET | ORAL | Status: DC | PRN
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
## 2017-02-19 NOTE — Progress Notes (Signed)
Patient discharged to rehab. Report given to receiving nurse. Patient in good condition at time of discharge. All patient belongings taking to rehab with patient

## 2017-02-19 NOTE — Progress Notes (Signed)
Trach sutures removed per MD order.  Pt tol well, trach ties secure.

## 2017-02-20 ENCOUNTER — Inpatient Hospital Stay (HOSPITAL_COMMUNITY): Payer: Medicaid Other

## 2017-02-20 ENCOUNTER — Inpatient Hospital Stay (HOSPITAL_COMMUNITY): Payer: Medicaid Other | Admitting: Speech Pathology

## 2017-02-20 ENCOUNTER — Inpatient Hospital Stay (HOSPITAL_COMMUNITY): Payer: Medicaid Other | Admitting: Occupational Therapy

## 2017-02-20 ENCOUNTER — Inpatient Hospital Stay (HOSPITAL_COMMUNITY): Payer: Medicaid Other | Admitting: Physical Therapy

## 2017-02-20 DIAGNOSIS — I63512 Cerebral infarction due to unspecified occlusion or stenosis of left middle cerebral artery: Secondary | ICD-10-CM

## 2017-02-20 DIAGNOSIS — R609 Edema, unspecified: Secondary | ICD-10-CM

## 2017-02-20 DIAGNOSIS — Z930 Tracheostomy status: Secondary | ICD-10-CM

## 2017-02-20 DIAGNOSIS — W3400XA Accidental discharge from unspecified firearms or gun, initial encounter: Secondary | ICD-10-CM

## 2017-02-20 DIAGNOSIS — I7771 Dissection of carotid artery: Secondary | ICD-10-CM

## 2017-02-20 DIAGNOSIS — I69391 Dysphagia following cerebral infarction: Secondary | ICD-10-CM

## 2017-02-20 LAB — COMPREHENSIVE METABOLIC PANEL
ALBUMIN: 2.5 g/dL — AB (ref 3.5–5.0)
ALK PHOS: 115 U/L (ref 38–126)
ALT: 101 U/L — AB (ref 17–63)
AST: 42 U/L — ABNORMAL HIGH (ref 15–41)
Anion gap: 8 (ref 5–15)
BILIRUBIN TOTAL: 0.3 mg/dL (ref 0.3–1.2)
BUN: 14 mg/dL (ref 6–20)
CALCIUM: 9.2 mg/dL (ref 8.9–10.3)
CO2: 29 mmol/L (ref 22–32)
CREATININE: 0.65 mg/dL (ref 0.61–1.24)
Chloride: 99 mmol/L — ABNORMAL LOW (ref 101–111)
GFR calc Af Amer: >60 mL/min (ref >60)
GFR calc non Af Amer: >60 mL/min (ref >60)
GLUCOSE: 108 mg/dL — AB (ref 65–99)
Potassium: 4.1 mmol/L (ref 3.5–5.1)
Sodium: 136 mmol/L (ref 135–145)
TOTAL PROTEIN: 6.7 g/dL (ref 6.5–8.1)

## 2017-02-20 LAB — URINALYSIS, ROUTINE W REFLEX MICROSCOPIC
BILIRUBIN URINE: NEGATIVE
GLUCOSE, UA: NEGATIVE mg/dL
HGB URINE DIPSTICK: NEGATIVE
KETONES UR: NEGATIVE mg/dL
Leukocytes, UA: NEGATIVE
Nitrite: NEGATIVE
PH: 6 (ref 5.0–8.0)
Protein, ur: NEGATIVE mg/dL
SPECIFIC GRAVITY, URINE: 1.01 (ref 1.005–1.030)

## 2017-02-20 LAB — CBC WITH DIFFERENTIAL/PLATELET
BASOS PCT: 0 %
Basophils Absolute: 0 10*3/uL (ref 0.0–0.1)
EOS ABS: 0.6 10*3/uL (ref 0.0–0.7)
Eosinophils Relative: 6 %
HEMATOCRIT: 29.2 % — AB (ref 39.0–52.0)
HEMOGLOBIN: 9.4 g/dL — AB (ref 13.0–17.0)
LYMPHS ABS: 1.1 10*3/uL (ref 0.7–4.0)
Lymphocytes Relative: 12 %
MCH: 28.3 pg (ref 26.0–34.0)
MCHC: 32.2 g/dL (ref 30.0–36.0)
MCV: 88 fL (ref 78.0–100.0)
MONOS PCT: 9 %
Monocytes Absolute: 0.8 10*3/uL (ref 0.1–1.0)
NEUTROS ABS: 6.7 10*3/uL (ref 1.7–7.7)
NEUTROS PCT: 73 %
Platelets: 528 10*3/uL — ABNORMAL HIGH (ref 150–400)
RBC: 3.32 MIL/uL — AB (ref 4.22–5.81)
RDW: 14.1 % (ref 11.5–15.5)
WBC: 9.2 10*3/uL (ref 4.0–10.5)

## 2017-02-20 LAB — GLUCOSE, CAPILLARY
GLUCOSE-CAPILLARY: 121 mg/dL — AB (ref 65–99)
Glucose-Capillary: 120 mg/dL — ABNORMAL HIGH (ref 65–99)
Glucose-Capillary: 92 mg/dL (ref 65–99)
Glucose-Capillary: 97 mg/dL (ref 65–99)

## 2017-02-20 MED ORDER — JEVITY 1.2 CAL PO LIQD
1000.0000 mL | ORAL | Status: DC
  Administered 2017-02-20 – 2017-02-24 (×7): 1000 mL
  Filled 2017-02-20: qty 1000
  Filled 2017-02-20: qty 237
  Filled 2017-02-20 (×3): qty 1000
  Filled 2017-02-20: qty 237
  Filled 2017-02-20 (×3): qty 1000
  Filled 2017-02-20: qty 237
  Filled 2017-02-20 (×2): qty 1000
  Filled 2017-02-20: qty 237
  Filled 2017-02-20 (×3): qty 1000
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
## 2017-02-20 NOTE — Progress Notes (Signed)
Social Work Patient ID: [name], male   DOB: May 24, 1981, 36 y.o.   MRN: [id number]   Attempted to reach pt's sister, [name], but person answering phone reports she is not home but may be on her way to hospital.  Need to speak with her to complete assessment interview due to pt's significant cognition and communication deficits.  Assessment currently pended and will complete when able to speak with sister.  Cerrone Debold, LCSW

## 2017-02-20 NOTE — Progress Notes (Signed)
*  PRELIMINARY RESULTS* Vascular Ultrasound BIlateral lower extremity venous duplex has been completed.  Preliminary findings: No evidence of deep vein thrombosis in the visualized veins of the lower extremities.  Negative for baker's cysts bilaterally.   Everrett Coombe 02/20/2017, 8:31 AM
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
## 2017-02-20 NOTE — Care Management Note (Signed)
[facility] Individual Statement of Services  Patient Name:  Arthur Beasley  Date:  02/20/2017  Welcome to the [facility].  Our goal is to provide you with an individualized program based on your diagnosis and situation, designed to meet your specific needs.  With this comprehensive rehabilitation program, you will be expected to participate in at least 3 hours of rehabilitation therapies Monday-Friday, with modified therapy programming on the weekends.  Your rehabilitation program will include the following services:  Physical Therapy (PT), Occupational Therapy (OT), Speech Therapy (ST), 24 hour per day rehabilitation nursing, Therapeutic Recreaction (TR), Neuropsychology, Case Management (Social Worker), Rehabilitation Medicine, Nutrition Services and Pharmacy Services  Weekly team conferences will be held on Tuesdays to discuss your progress.  Your Social Worker will talk with you frequently to get your input and to update you on team discussions.  Team conferences with you and your family in attendance may also be held.  Expected length of stay: 4 weeks  Overall anticipated outcome: minimal assistance  Depending on your progress and recovery, your program may change. Your Social Worker will coordinate services and will keep you informed of any changes. Your Social Worker's name and contact numbers are listed  below.  The following services may also be recommended but are not provided by the [facility] will be made to provide these services after discharge if needed.  Arrangements include referral to agencies that provide these services.  Your insurance has been verified to be:  Medicaid Your primary doctor is:  None  Pertinent information will be shared with your doctor and your  insurance company.  Social Worker:  [place], [place] or [phone]   Information discussed with and copy given to patient by: Lennart Pall, 02/20/2017, 3:58 PM

## 2017-02-20 NOTE — Progress Notes (Signed)
Patient information reviewed and entered into eRehab system by Quinten Allerton, RN, CRRN, PPS Coordinator.  Information including medical coding and functional independence measure will be reviewed and updated through discharge.    

## 2017-02-20 NOTE — Progress Notes (Signed)
Received report from nurse.  Patient in bed resting with #8 cuffed trach. Patent and in place. No distress noted. Skin clean dry and intact.
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
## 2017-02-20 NOTE — Progress Notes (Signed)
Occupational Therapy Note  Patient Details  Name: Arthur Beasley MRN: [id number] Date of Birth: 1981/08/08  Today's Date: 02/20/2017 OT Individual Time: 1400-1430 OT Individual Time Calculation (min): 30 min   Pt with no s/s of pain Individual therapy  Pt resting in bed upon arrival, having just returned from earlier PT session.  Pt initially engaged in bed mobility tasks and sitting balance EOB.  Pt's head laterally flexed to to R with increased muscle tone in R trapezius.  Pt required min/mod A for sitting balance.  Pt nodded yes when asked if he was dizzy and returned to bed with HOB elevated to 30 degrees.  Focus on task initiation, sequencing, bed mobility, sitting balance, and activity tolerance to increase independence with BADLs.    [name] [facility] 02/20/2017, 2:57 PM
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
## 2017-02-21 ENCOUNTER — Inpatient Hospital Stay (HOSPITAL_COMMUNITY): Payer: Medicaid Other | Admitting: Speech Pathology

## 2017-02-21 ENCOUNTER — Inpatient Hospital Stay (HOSPITAL_COMMUNITY): Payer: Self-pay

## 2017-02-21 ENCOUNTER — Inpatient Hospital Stay (HOSPITAL_COMMUNITY): Payer: Self-pay | Admitting: Physical Therapy

## 2017-02-21 DIAGNOSIS — D75839 Thrombocytosis, unspecified: Secondary | ICD-10-CM

## 2017-02-21 DIAGNOSIS — Z931 Gastrostomy status: Secondary | ICD-10-CM

## 2017-02-21 DIAGNOSIS — R739 Hyperglycemia, unspecified: Secondary | ICD-10-CM

## 2017-02-21 DIAGNOSIS — D473 Essential (hemorrhagic) thrombocythemia: Secondary | ICD-10-CM

## 2017-02-21 DIAGNOSIS — D62 Acute posthemorrhagic anemia: Secondary | ICD-10-CM

## 2017-02-21 LAB — GLUCOSE, CAPILLARY
GLUCOSE-CAPILLARY: 113 mg/dL — AB (ref 65–99)
GLUCOSE-CAPILLARY: 99 mg/dL (ref 65–99)
Glucose-Capillary: 112 mg/dL — ABNORMAL HIGH (ref 65–99)
Glucose-Capillary: 117 mg/dL — ABNORMAL HIGH (ref 65–99)

## 2017-02-21 LAB — HEMOGLOBIN A1C
Hgb A1c MFr Bld: 5.4 % (ref 4.8–5.6)
MEAN PLASMA GLUCOSE: 108 mg/dL
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
## 2017-02-21 NOTE — Progress Notes (Signed)
Physical Therapy Session Note  Patient Details  Name: Arthur Beasley MRN: [id number] Date of Birth: Dec 02, 1980  Today's Date: 02/21/2017 PT Individual Time: 1500-1600 PT Individual Time Calculation (min): 60 min   Short Term Goals: Week 1:  PT Short Term Goal 1 (Week 1): Pt will perform bed mobility with mod assist  PT Short Term Goal 2 (Week 1): Pt will perform bed<>chair transfer with mod assist  PT Short Term Goal 3 (Week 1): Pt will ambulate >24f with max assist and LRAD PT Short Term Goal 4 (Week 1): Pt will propel WC 108fwith mod assist.  PT Short Term Goal 5 (Week 1): Pt will maintain dynamic sitting balance with Mod assist while performing reaching tasks.    Skilled Therapeutic Interventions/Progress Updates:   Pt received supine in bed and agreeable to PT. Supine>sit transfer with max assist for safety and max cues for sequencing  PT provided pt with TIS WC to allow improved sitting tolerance.   Squat pivot transfers with max assist from PT.   Car transfer with max assist from PT.  And manx cues for safety.   Sitting balance EOB x 10 minutes with reaching task to L and R with LUE. Max assist to maintain upright posture while reaching and min assist for static balance between   [facility] returned too room [facility] reach and all needs met.           Therapy Documentation Precautions:  Precautions Precautions: Fall Precaution Comments: left flap, no bone graft; R lean in sitting without LUE support Restrictions Weight Bearing Restrictions: No    Vital Signs: Therapy Vitals Temp: 98.5 F (36.9 C) Temp Source: Axillary Pulse Rate: 93 Resp: 20 BP: 123/69 Patient Position (if appropriate): Lying Oxygen Therapy SpO2: 100 % O2 Device: Tracheostomy Collar O2 Flow Rate (L/min): 6 L/min FiO2 (%): 28 % See Function Navigator for Current Functional Status.   Therapy/Group: Individual Therapy  AuLorie Phenix/05/2017, 5:21  PM
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
## 2017-02-21 NOTE — Progress Notes (Signed)
Occupational Therapy Session Note  Patient Details  Name: Arthur Beasley MRN: [id number] Date of Birth: [birth year]  Today's Date: 02/21/2017 OT Individual Time: 1000-1100 OT Individual Time Calculation (min): 60 min    Short Term Goals: Week 1:  OT Short Term Goal 1 (Week 1): Pt will be able to sit at EOB with min A while using LUE to wash RUE. OT Short Term Goal 2 (Week 1): Pt will be able to stand with mod A of one person and use his LUE to pull his pants over his hips at least 50% of the way. OT Short Term Goal 3 (Week 1): Pt will transfer to a BSC with mod A of one person. OT Short Term Goal 4 (Week 1): Pt will use his L hand to self cleanse after toileting with min A. OT Short Term Goal 5 (Week 1): Pt will self range his RUE with min A.  Skilled Therapeutic Interventions/Progress Updates: Skilled OT session completed with focus on adaptive bathing/dressing skills, safe R UE positioning, trunk control, and proper neck alignment. Pt was lying in bed at time of arrival, agreeable to session with no c/o pain. Pt transitioned to EOB with Min A for protecting flaccid R UE. Vitals stable during UB bathing. R UE protected on top of pillow. Pt perseverating on washing eyes, required HOH cues to redirect him to bathing tasks, and left side of body. When OT placed pt in figure 4 position, he was able to wash his right foot (when asked to do this with his left foot, he shook his head). 02 sats at this time decreased to 84% on supplemental 02. Pt cued for diaphragmatic breathing and was instructed on method for self suctioning. He brought the suction to his lift eye, HOH for placing it in mouth. 02 sats increased back to 99%.  Pericare/LB dressing completed bedlevel with pt rolling R<L with Min A and instruction on technique. During ADLs 2 helpers required, 1 tech assisting with sitting balance while OT assisted with bathing tasks and line mgt. Gentle facilitation of bringing neck closer to neutral  provided due to pts laterally flexed neck. At end of session pt was repositioned in bed to protect hemiplegic side. All needs within reach, 02 99%. Bed alarm activated.     Therapy Documentation Precautions:  Precautions Precautions: Fall Precaution Comments: left flap, no bone graft; R lean in sitting without LUE support Restrictions Weight Bearing Restrictions: No General:   Vital Signs: Therapy Vitals Pulse Rate: 83 Resp: 18 Patient Position (if appropriate): Lying Oxygen Therapy SpO2: 100 % O2 Device: Tracheostomy Collar O2 Flow Rate (L/min): 6 L/min FiO2 (%): 28 % Pain: Pt shook his head "no" when asked if he had pain   ADL: ADL ADL Comments: refer to functional navigator     See Function Navigator for Current Functional Status.   Therapy/Group: Individual Therapy  Dez Stauffer A Shiah Berhow 02/21/2017, 12:47 PM

## 2017-02-21 NOTE — Plan of Care (Signed)
Problem: RH BOWEL ELIMINATION Goal: RH STG MANAGE BOWEL WITH ASSISTANCE STG Manage Bowel with mod Assistance.  Outcome: Not Progressing Total assist-incontinent

## 2017-02-21 NOTE — Plan of Care (Signed)
Problem: RH BLADDER ELIMINATION Goal: RH STG MANAGE BLADDER WITH ASSISTANCE STG Manage Bladder With Mod Assistance  Outcome: Not Progressing Total assist

## 2017-02-22 ENCOUNTER — Inpatient Hospital Stay (HOSPITAL_COMMUNITY): Payer: Self-pay

## 2017-02-22 DIAGNOSIS — S069X9S Unspecified intracranial injury with loss of consciousness of unspecified duration, sequela: Secondary | ICD-10-CM

## 2017-02-22 LAB — GLUCOSE, CAPILLARY
GLUCOSE-CAPILLARY: 90 mg/dL (ref 65–99)
GLUCOSE-CAPILLARY: 109 mg/dL — AB (ref 65–99)
GLUCOSE-CAPILLARY: 88 mg/dL (ref 65–99)

## 2017-02-22 LAB — URINE CULTURE

## 2017-02-22 MED ORDER — ALTEPLASE 2 MG IJ SOLR
2.0000 mg | Freq: Once | INTRAMUSCULAR | Status: AC
  Administered 2017-02-22: 2 mg
  Filled 2017-02-22 (×2): qty 2
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
## 2017-02-22 NOTE — Progress Notes (Signed)
Occupational Therapy Session Note  Patient Details  Name: Arthur Beasley MRN: [id number] Date of Birth: April 23, 1981  Today's Date: 02/22/2017 OT Individual Time: 2376-2831 OT Individual Time Calculation (min): 55 min   Short Term Goals: Week 1:  OT Short Term Goal 1 (Week 1): Pt will be able to sit at EOB with min A while using LUE to wash RUE. OT Short Term Goal 2 (Week 1): Pt will be able to stand with mod A of one person and use his LUE to pull his pants over his hips at least 50% of the way. OT Short Term Goal 3 (Week 1): Pt will transfer to a BSC with mod A of one person. OT Short Term Goal 4 (Week 1): Pt will use his L hand to self cleanse after toileting with min A. OT Short Term Goal 5 (Week 1): Pt will self range his RUE with min A.  Skilled Therapeutic Interventions/Progress Updates: ADL-retraining at bed level and EOB with focus on improved bed mobility, NMR of R-U/LE, static sitting balance, adapted bathing/ dressing skills, and transfers.   Pt received supine in bed, alert and response to simple cues using facial gestures/head nods.   Pt receptive to bed level BADL for lower body and EOB for upper body.   With setup to provide supplies, pt instructed on bathing supine w/HOB elevated using left hand.   Pt washed left side of his both with adequate thoroughness but required manual facilitation to lift and place R-U/LE in position to attempt washing right extremities.   With brief removed pt was able to wash his periarea with min vc but required max assist to wash his buttocks.   Pt able to roll right and left with mod assist to lift legs and place in position although pt attempted rolls as instructed.  With mod assist to lift trunk to sit at EOB, pt continued upper body bathing, incorporating left lateral leans as needed while being assisted with washing under each of his arms.   Pt donned new gown and non-skid socks and performed stand-pivot tranfer to w/c with overall max-mod assist  X1, +2 helper standing by for safety.   Pt left in tilt-in-space w/c at end of session with all needs placed within reach.  Therapy Documentation Precautions:  Precautions Precautions: Fall Precaution Comments: left flap, no bone graft; R lean in sitting without LUE support Restrictions Weight Bearing Restrictions: No   Vital Signs: Therapy Vitals Pulse Rate: 77 Resp: 16 Patient Position (if appropriate): Lying Oxygen Therapy SpO2: 100 % O2 Device: Tracheostomy Collar O2 Flow Rate (L/min): 6 L/min FiO2 (%): 28 %   Pain: Pain Assessment Pain Assessment: No/denies pain   ADL: ADL ADL Comments: refer to functional navigator    See Function Navigator for Current Functional Status.   Therapy/Group: Individual Therapy  [place] 02/22/2017, 10:11 AM

## 2017-02-23 ENCOUNTER — Inpatient Hospital Stay (HOSPITAL_COMMUNITY): Payer: Self-pay

## 2017-02-23 ENCOUNTER — Inpatient Hospital Stay (HOSPITAL_COMMUNITY): Payer: Medicaid Other | Admitting: Physical Therapy

## 2017-02-23 ENCOUNTER — Inpatient Hospital Stay (HOSPITAL_COMMUNITY): Payer: Self-pay | Admitting: Physical Therapy

## 2017-02-23 LAB — GLUCOSE, CAPILLARY
GLUCOSE-CAPILLARY: 131 mg/dL — AB (ref 65–99)
GLUCOSE-CAPILLARY: 145 mg/dL — AB (ref 65–99)
Glucose-Capillary: 102 mg/dL — ABNORMAL HIGH (ref 65–99)
Glucose-Capillary: 133 mg/dL — ABNORMAL HIGH (ref 65–99)
Glucose-Capillary: 93 mg/dL (ref 65–99)

## 2017-02-23 MED ORDER — CHLORHEXIDINE GLUCONATE CLOTH 2 % EX PADS: 6.0000 | MEDICATED_PAD | Freq: Every day | CUTANEOUS | Status: DC

## 2017-02-23 MED ORDER — CHLORHEXIDINE GLUCONATE CLOTH 2 % EX PADS
6.0000 | MEDICATED_PAD | Freq: Every day | CUTANEOUS | Status: DC
  Administered 2017-02-23 – 2017-03-08 (×10): 6 via TOPICAL
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
## 2017-02-23 NOTE — Progress Notes (Signed)
Speech Language Pathology Daily Session Note  Patient Details  Name: Arthur Beasley MRN: [id number] Date of Birth: [birth year]  Today's Date: 02/23/2017 SLP Individual Time: 1030-1120 SLP Individual Time Calculation (min): 50 min  Short Term Goals: Week 1: SLP Short Term Goal 1 (Week 1): Pt will use multimodal communication to communicate wants and needs in 50% of opportunities with Mod A multimodal cues.  SLP Short Term Goal 2 (Week 1): Pt will follow 1 step directions with >90% and Min A verbal cues.  SLP Short Term Goal 3 (Week 1): Pt will tolerate PMSV placement for ~ 5 minutes without decreased in vitals or pt request for PMSV to be removeda.  SLP Short Term Goal 4 (Week 1): Pt will produce voicing with Mod A verbal cues for 50% of opportunites and Max A multimodal cues.  SLP Short Term Goal 5 (Week 1): Pt will answer basic biographical questions with gestures or selection of written choices with 75% accuarcy and Mod A cues.  SLP Short Term Goal 6 (Week 1): Given Max A multimodal cues, pt will produce swallow in 25% of opportunites.   Skilled Therapeutic Interventions: Skilled treatment session focused on cognition and dysphagia goals. SLP facilitated session by placing PMSV. Pt maintained vitals of 95% O2 and HR of 95. Pt tolerated without any change in vitals for 10 minute intervals. Pt with attempts to voice but no voicing present. Questionable language/apraxia component. Pt was 75% accurate in placing his children's name on correct paper to indicate that they were his children. Pt ws ~ 75% accurate in selecting correct orientation information from field of 2 written choices. Pt unable to produce swallow with Total A multimodal cues. Pt unable to imitate any oral movements. Pt with unsuccessful attempts to write but can copy words present on page. Pt with overal increased toleration of PMSV and is appropriate to wear during all therapy sessions. Pt was returned to room, left upright in  wheelchair, safety belt donned and all needs within reach. Continue per current plan of care.      Function:  Eating Eating Eating activity did not occur: Safety/medical concerns               Cognition Comprehension Comprehension assist level: Understands basic 75 - 89% of the time/ requires cueing 10 - 24% of the time  Expression Expression assistive device: Talk trach valve Expression assist level: Expresses basis less than 25% of the time/requires cueing >75% of the time.  Social Interaction Social Interaction assist level: Interacts appropriately 50 - 74% of the time - May be physically or verbally inappropriate.  Problem Solving Problem solving assist level: Solves basic 25 - 49% of the time - needs direction more than half the time to initiate, plan or complete simple activities  Memory Memory assist level: Recognizes or recalls 25 - 49% of the time/requires cueing 50 - 75% of the time    Pain Pain Assessment Faces Pain Scale: Hurts little more Pain Type: Acute pain Pain Location: Other (Comment) (whole right side-head nods) Pain Descriptors / Indicators: Aching Pain Intervention(s): Medication (See eMAR)  Therapy/Group: Individual Therapy   Salil Raineri B. Rutherford Nail, M.S., [place] 02/23/2017, 11:45 AM

## 2017-02-23 NOTE — Plan of Care (Signed)
Problem: RH BLADDER ELIMINATION Goal: RH STG MANAGE BLADDER WITH ASSISTANCE STG Manage Bladder With Mod Assistance   Outcome: Not Progressing incontinent

## 2017-02-23 NOTE — Progress Notes (Signed)
Occupational Therapy Session Note  Patient Details  Name: Arthur Beasley MRN: [id number] Date of Birth: 05-06-1981  Today's Date: 02/23/2017 OT Individual Time: 0800-0900 OT Individual Time Calculation (min): 60 min    Short Term Goals: Week 1:  OT Short Term Goal 1 (Week 1): Pt will be able to sit at EOB with min A while using LUE to wash RUE. OT Short Term Goal 2 (Week 1): Pt will be able to stand with mod A of one person and use his LUE to pull his pants over his hips at least 50% of the way. OT Short Term Goal 3 (Week 1): Pt will transfer to a BSC with mod A of one person. OT Short Term Goal 4 (Week 1): Pt will use his L hand to self cleanse after toileting with min A. OT Short Term Goal 5 (Week 1): Pt will self range his RUE with min A.  Skilled Therapeutic Interventions/Progress Updates:    Pt resting in bed upon arrival.  Pt engaged in BADL retraining including bathing/dressing with sit<>stand from w/c at sink.  Pt sat EOB with min A and required min A for sitting balance in preparation for stand pivot transfer to w/c.  Pt required multimodal cues to shift weight onto RLE during transfer.  Pt required mod multimodal cues for task initiation and sequencing during bathing tasks.  Pt perseverates during bathing tasks, requiring intervention to transition.  Pt required max A for hemi dressing techniques but initiated all dressing tasks when presented with clothing.  Pt transitioned to therapy gym and engaged in dynamic sitting activities with weightbearing through RUE while retrieving colored bean bags with LUE.  Pt required min verbal cues to select appropriate colors.  Pt returned to TIS w/c (stand pivot) and remained in w/c with QRB in place and call bell within reach. Focus on activity tolerance, sitting balance, bed mobility, sit<>stand, BADL retraining, task initiation, sequencing, and safety awareness to increase independence with BADLs.   Therapy Documentation Precautions:   Precautions Precautions: Fall Precaution Comments: left flap, no bone graft; R lean in sitting without LUE support Restrictions Weight Bearing Restrictions: No Pain: Pain Assessment Faces Pain Scale: Hurts even more Pain Type: Acute pain Pain Location: Other (Comment) (whole right side-head nods) Pain Descriptors / Indicators: Aching Pain Intervention(s): pt premedicated  See Function Navigator for Current Functional Status.   Therapy/Group: Individual Therapy  Leroy Libman 02/23/2017, 10:03 AM

## 2017-02-23 NOTE — Progress Notes (Signed)
Occupational Therapy Note  Patient Details  Name: Arthur Beasley MRN: [id number] Date of Birth: 08/15/1981  Today's Date: 02/23/2017 OT Individual Time: 1400-1430 OT Individual Time Calculation (min): 30 min   Pt with no s/s of pain Individual Therapy  Pt asleep in bed upon arrival.  Pt required min multimodal cues to arouse.  Focus on sitting balance EOB, attention to R, task initiation, sequencing, and activity tolerance to increase independence with BADLs.  Pt required min a for sitting balance.  Pt perseverated on washing his mouth and required min multimodal cues to sequence washing face.  Pt performed sit<>stand X 3 from EOB with min A but required max A when requested to shift weight to RLE.  Pt returned to bed and remained in bed with all needs within reach.   [name] [facility] 02/23/2017, 2:44 PM

## 2017-02-23 NOTE — Progress Notes (Signed)
RT placed patient on humidified air. Patient is tolerating well at this time with an O2 sat of 100. RN aware.

## 2017-02-23 NOTE — Progress Notes (Signed)
Physical Therapy Session Note  Patient Details  Name: Arthur Beasley MRN: [id number] Date of Birth: June 03, 1981  Today's Date: 02/23/2017 PT Individual Time: 9417-4081 PT Individual Time Calculation (min): 57 min   Short Term Goals: Week 1:  PT Short Term Goal 1 (Week 1): Pt will perform bed mobility with mod assist  PT Short Term Goal 2 (Week 1): Pt will perform bed<>chair transfer with mod assist  PT Short Term Goal 3 (Week 1): Pt will ambulate >81ft with max assist and LRAD PT Short Term Goal 4 (Week 1): Pt will propel WC 134ft with mod assist.  PT Short Term Goal 5 (Week 1): Pt will maintain dynamic sitting balance with Mod assist while performing reaching tasks.    Skilled Therapeutic Interventions/Progress Updates:    Pt in bed upon arrival, agreeable to PT session. Bed mobility performed with mod. Assist supine<>sitting. Cues for sequence and initiation. Sitting balance activities performed with reaching with Lt UE into variable visual fields. Cues for visual tracking to locate hand and various objects in room. Incorporating head positioning and postural cues throughout session. Transfers: sit<>stand with mod assist from EOB. In standing, working on lateral weight shifts with PT blocking Rt knee for stability (repeating X2). Ambulation performed from EOB with HHA on LT and manual assist on Rt for Rt knee control and weightshifting (X2 - 4 feet/attempt). Pt quickly fatiguing during standing, HR increasing to 120. Ending session while sitting EOB, RT requesting activity without O2, SpO2 staying 98-100%. Pt returned to bed after session with O2 back. All needs in reach, call light on.   Therapy Documentation Precautions:  Precautions Precautions: Fall Precaution Comments: left flap, no bone graft; R lean in sitting without LUE support Restrictions Weight Bearing Restrictions: No Pain:  Acknowledges pain but unable to indicate location. Monitored throughout session.   See Function  Navigator for Current Functional Status.   Therapy/Group: Individual Therapy  Linard Millers, PT 02/23/2017, 3:41 PM

## 2017-02-24 ENCOUNTER — Inpatient Hospital Stay (HOSPITAL_COMMUNITY): Payer: Self-pay | Admitting: Physical Therapy

## 2017-02-24 ENCOUNTER — Inpatient Hospital Stay (HOSPITAL_COMMUNITY): Payer: Medicaid Other | Admitting: Speech Pathology

## 2017-02-24 ENCOUNTER — Inpatient Hospital Stay (HOSPITAL_COMMUNITY): Payer: Self-pay

## 2017-02-24 LAB — GLUCOSE, CAPILLARY
GLUCOSE-CAPILLARY: 90 mg/dL (ref 65–99)
GLUCOSE-CAPILLARY: 93 mg/dL (ref 65–99)
Glucose-Capillary: 108 mg/dL — ABNORMAL HIGH (ref 65–99)

## 2017-02-24 NOTE — Progress Notes (Signed)
Speech Language Pathology Daily Session Note  Patient Details  Name: Arthur Beasley MRN: [id number] Date of Birth: 06-Sep-1981  Today's Date: 02/24/2017 SLP Individual Time: 0867-6195 SLP Individual Time Calculation (min): 15 min  Short Term Goals: Week 1: SLP Short Term Goal 1 (Week 1): Pt will use multimodal communication to communicate wants and needs in 50% of opportunities with Mod A multimodal cues.  SLP Short Term Goal 2 (Week 1): Pt will follow 1 step directions with >90% and Min A verbal cues.  SLP Short Term Goal 3 (Week 1): Pt will tolerate PMSV placement for ~ 5 minutes without decreased in vitals or pt request for PMSV to be removeda.  SLP Short Term Goal 4 (Week 1): Pt will produce voicing with Mod A verbal cues for 50% of opportunites and Max A multimodal cues.  SLP Short Term Goal 5 (Week 1): Pt will answer basic biographical questions with gestures or selection of written choices with 75% accuarcy and Mod A cues.  SLP Short Term Goal 6 (Week 1): Given Max A multimodal cues, pt will produce swallow in 25% of opportunites.   Skilled Therapeutic Interventions: Skilled treatment session focused cognition goals. SLP facilitated session by providing supervision cues for following 1 step directions within sitting EOB and transferring to wheelchair activities. Pt able to sustain attention to task with Mod I for > 15 minutes. Pt was left upright in wheelchair, safety belt donned and all needs within reach. Continue per current plan of care.      Function:    Cognition Comprehension Comprehension assist level: Understands basic 75 - 89% of the time/ requires cueing 10 - 24% of the time  Expression Expression assistive device: Talk trach valve Expression assist level: Expresses basis less than 25% of the time/requires cueing >75% of the time.  Social Interaction Social Interaction assist level: Interacts appropriately 25 - 49% of time - Needs frequent redirection.  Problem  Solving Problem solving assist level: Solves basic 50 - 74% of the time/requires cueing 25 - 49% of the time  Memory Memory assist level: Recognizes or recalls 50 - 74% of the time/requires cueing 25 - 49% of the time    Pain    Therapy/Group: Individual Therapy  Arthur Beasley 02/24/2017, 3:12 PM

## 2017-02-24 NOTE — Progress Notes (Signed)
Recreational Therapy Session Note  Patient Details  Name: Arthur Beasley MRN: [id number] Date of Birth: [birth year] Today's Date: 02/24/2017   Pt placed on HOLD for TR services.  Will continue to monitor through team for future participation. [place] 02/24/2017, 9:53 AM

## 2017-02-24 NOTE — Progress Notes (Signed)
Orthopedic Tech Progress Note Patient Details:  Arthur Beasley [birth year] [id number] Called in brace order Patient ID: JESHUA RANSFORD, male   DOB: 1981-02-21, 36 y.o.   MRN: [id number]   Braulio Bosch 02/24/2017, 4:51 PM

## 2017-02-24 NOTE — Progress Notes (Signed)
Physical Therapy Session Note  Patient Details  Name: Arthur Beasley MRN: [id number] Date of Birth: 06-Nov-1981  Today's Date: 02/24/2017 PT Individual Time: 1000-1100 PT Individual Time Calculation (min): 60 min  And PT Co-treatment Time: 1445-1500 PT Co-treatment Time Calculation: 15 min (total 75 min)  Short Term Goals: Week 1:  PT Short Term Goal 1 (Week 1): Pt will perform bed mobility with mod assist  PT Short Term Goal 2 (Week 1): Pt will perform bed<>chair transfer with mod assist  PT Short Term Goal 3 (Week 1): Pt will ambulate >37ft with max assist and LRAD PT Short Term Goal 4 (Week 1): Pt will propel WC 135ft with mod assist.  PT Short Term Goal 5 (Week 1): Pt will maintain dynamic sitting balance with Mod assist while performing reaching tasks.    Skilled Therapeutic Interventions/Progress Updates: Tx 1: Pt received seated semi-reclined in tilt in space w/c, denies pain through yes/no questions, and agreeable to treatment. Per RT and RN, planned to have pt to participate in therapy session without supplemental O2 and monitor O2. Pt remained on room air throughout session with O2 100% at each assessment, HR 98-100bpm following activity. PMSV donned initially, however pt began coughing and valve removed with secretions expelled through trach, and pt indicating he does not want to wear valve anymore. Gait 2x25' with RUE over therapist's shoulder, able to progress RLE inconsistently however max/totalA for stance control and postural control, +2A for safety. Seated balance with dynamic LUE reaching and min guard to retrieve horseshoes and throw to target. Gait x40' as above to nustep. Performed BLE nustep x7 min total, several rest breaks due to fatigue and RLE assist strap to maintain LE placement. Required assist to initiate RLE extension on bike however then able to demo activation. Squat pivot transfer nustep>w/c with modA to L side. Squat pivot transfer to bed on R side maxA. Sit  >supine modA for RLE management. Remained semi reclined, HOB 30 degrees, RLE/RUE supported on pillow for pressure relief, alarm intact and all needs in reach.   Tx 2: Pt seen for 30 min co-treat with SLP for focus on communication goals with mobility. Pt on humidified room air via trach collar throughout session and vitals remained WNL. Performed supine>sit with maxA d/t impulsive movements and poor trunk control. Seated EOB with min guard focus on sitting balance with reduced reliance on LUE for stability, RUE weight bearing on elbow. Transfer to w/c modA +2 squat pivot. Remained semi-reclined in w/c at end of session, quick release belt intact and all needs in reach.      Therapy Documentation Precautions:  Precautions Precautions: Fall Precaution Comments: left flap, no bone graft; R lean in sitting without LUE support Restrictions Weight Bearing Restrictions: No  See Function Navigator for Current Functional Status.   Therapy/Group: Individual Therapy and Co-Treatment  Luberta Mutter 02/24/2017, 11:15 AM

## 2017-02-24 NOTE — Progress Notes (Signed)
Occupational Therapy Session Note  Patient Details  Name: Arthur Beasley MRN: [id number] Date of Birth: [birth year]  Today's Date: 02/24/2017 OT Individual Time: 0800-0900 OT Individual Time Calculation (min): 60 min    Short Term Goals: Week 1:  OT Short Term Goal 1 (Week 1): Pt will be able to sit at EOB with min A while using LUE to wash RUE. OT Short Term Goal 2 (Week 1): Pt will be able to stand with mod A of one person and use his LUE to pull his pants over his hips at least 50% of the way. OT Short Term Goal 3 (Week 1): Pt will transfer to a BSC with mod A of one person. OT Short Term Goal 4 (Week 1): Pt will use his L hand to self cleanse after toileting with min A. OT Short Term Goal 5 (Week 1): Pt will self range his RUE with min A.  Skilled Therapeutic Interventions/Progress Updates:    Pt resting in bed upon arrival with brother present.  Pt's brother remained in room and assisted with tasks during therapy session.  Pt engaged in BADL retraining including bathing/dressing with sit<>stand from w/c at sink.  Pt followed one step commands throughout session but required min verbal cues for sequencing during bathing tasks.  Pt performed supine>sit EOB with min A and stand pivot transfer to w/c with mod A.  Pt currently not shifting weight onto RLE during transfers and standing at sink.  Pt requires min A for standing balance for LB bathing/dressing tasks.  Pt initiated dressing tasks when presented with clothing.  Pt requires tot A for hemi dressing techniques.  Pt remained in [place] w/c with QRB in place and RN present. Focus on activity tolerance, bed mobility, sitting balance, sit<>stand, functional transfers, standing balance, attention to L, and safety awareness to increase independence with BADLs.   Therapy Documentation Precautions:  Precautions Precautions: Fall Precaution Comments: left flap, no bone graft; R lean in sitting without LUE support Restrictions Weight Bearing  Restrictions: No Pain:  Pt denied pain  See Function Navigator for Current Functional Status.   Therapy/Group: Individual Therapy  Leroy Libman 02/24/2017, 9:05 AM
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
## 2017-02-24 NOTE — Progress Notes (Signed)
Speech Language Pathology Daily Session Note  Patient Details  Name: CLELL TRAHAN MRN: [id number] Date of Birth: August 20, 1981  Today's Date: 02/24/2017 SLP Individual Time: 1300-1400 SLP Individual Time Calculation (min): 60 min  Short Term Goals: Week 1: SLP Short Term Goal 1 (Week 1): Pt will use multimodal communication to communicate wants and needs in 50% of opportunities with Mod A multimodal cues.  SLP Short Term Goal 2 (Week 1): Pt will follow 1 step directions with >90% and Min A verbal cues.  SLP Short Term Goal 3 (Week 1): Pt will tolerate PMSV placement for ~ 5 minutes without decreased in vitals or pt request for PMSV to be removeda.  SLP Short Term Goal 4 (Week 1): Pt will produce voicing with Mod A verbal cues for 50% of opportunites and Max A multimodal cues.  SLP Short Term Goal 5 (Week 1): Pt will answer basic biographical questions with gestures or selection of written choices with 75% accuarcy and Mod A cues.  SLP Short Term Goal 6 (Week 1): Given Max A multimodal cues, pt will produce swallow in 25% of opportunites.   Skilled Therapeutic Interventions: Skilled treatment session focused on cognition goals. SLP facilitated session by placing PMSV on valve. He tolerated placement of PMSV for 60 minute session without any decline in vitals. Pt continues with secretions which he coughs at trach and blows PMSV off trach hub. Pt with intermittent audible secretions but pt unable to produce cough on demand with Total A. Initially, it appeared that pt was possibly voicing but no vibrations felt and no facial indication that he was attempting phonation - it appears that it was audible secretions not phonation. After cough and secretions cleared, no phonation heard. Pt able to select printed words in field of 2 for biographical and orientation questions with 90% accuracy and supervision question cues. Pt able to select requested object in field of 3 with supervision cues. Simple money  task attempted but pt didn't demonstrate understanding. During money tasks, pt attempted to communicate with gestures but not effective. No swallow initiation during this session. PMSV removed, pt left upright in bed, bed alarm on and all needs within reach. Continue per current plan of care.      Function:    Cognition Comprehension Comprehension assist level: Understands basic 75 - 89% of the time/ requires cueing 10 - 24% of the time  Expression Expression assistive device: Talk trach valve Expression assist level: Expresses basis less than 25% of the time/requires cueing >75% of the time.  Social Interaction Social Interaction assist level: Interacts appropriately 25 - 49% of time - Needs frequent redirection.  Problem Solving Problem solving assist level: Solves basic 50 - 74% of the time/requires cueing 25 - 49% of the time  Memory Memory assist level: Recognizes or recalls 50 - 74% of the time/requires cueing 25 - 49% of the time    Pain    Therapy/Group: Individual Therapy   Caylynn Minchew B. Rutherford Nail, M.S., CCC-SLP Speech-Language Pathologist   Ervin Hensley 02/24/2017, 2:03 PM

## 2017-02-25 ENCOUNTER — Inpatient Hospital Stay (HOSPITAL_COMMUNITY): Payer: Self-pay | Admitting: Physical Therapy

## 2017-02-25 ENCOUNTER — Inpatient Hospital Stay (HOSPITAL_COMMUNITY): Payer: Self-pay

## 2017-02-25 ENCOUNTER — Inpatient Hospital Stay (HOSPITAL_COMMUNITY): Payer: Self-pay | Admitting: Speech Pathology

## 2017-02-25 LAB — GLUCOSE, CAPILLARY
GLUCOSE-CAPILLARY: 112 mg/dL — AB (ref 65–99)
GLUCOSE-CAPILLARY: 118 mg/dL — AB (ref 65–99)
GLUCOSE-CAPILLARY: 143 mg/dL — AB (ref 65–99)
GLUCOSE-CAPILLARY: 89 mg/dL (ref 65–99)
Glucose-Capillary: 129 mg/dL — ABNORMAL HIGH (ref 65–99)

## 2017-02-25 MED ORDER — JEVITY 1.5 CAL/FIBER PO LIQD
325.0000 mL | Freq: Four times a day (QID) | ORAL | Status: DC
  Administered 2017-02-25 – 2017-03-03 (×23): 325 mL
  Filled 2017-02-25 (×30): qty 1000

## 2017-02-25 MED ORDER — PRO-STAT SUGAR FREE PO LIQD
30.0000 mL | Freq: Every day | ORAL | Status: DC
  Administered 2017-02-25 – 2017-03-06 (×10): 30 mL
  Filled 2017-02-25 (×10): qty 30

## 2017-02-25 MED ORDER — FREE WATER
200.0000 mL | Freq: Four times a day (QID) | Status: DC
  Administered 2017-02-25 – 2017-03-13 (×59): 200 mL
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
## 2017-02-25 NOTE — Progress Notes (Signed)
MEDICATION RELATED NOTE    Pharmacy Re:  Home Meds  Medications:  Prescriptions Prior to Admission  Medication Sig Dispense Refill Last Dose  . amLODipine (NORVASC) 5 MG tablet Take 1 tablet (5 mg total) by mouth daily. (Patient not taking: Reported on 12/10/2016) 30 tablet 11 Not Taking at Unknown time  . benzonatate (TESSALON) 100 MG capsule Take 1 capsule (100 mg total) by mouth every 8 (eight) hours. 21 capsule 0   . ibuprofen (ADVIL,MOTRIN) 800 MG tablet Take 1 tablet (800 mg total) by mouth 3 (three) times daily. 21 tablet 0   . ondansetron (ZOFRAN ODT) 4 MG disintegrating tablet Take 1 tablet (4 mg total) by mouth every 8 (eight) hours as needed for nausea or vomiting. 20 tablet 0   . oxyCODONE (OXYCONTIN) 10 mg 12 hr tablet Take 10 mg by mouth daily as needed. For pain   12/09/2016 at Unknown time    Assessment: Medications reviewed, no RX dispense history on file and last refill information for medications was Jan. 24 2018 of this year for benzonatate, ibuprofen and ondansetron.  He has amlodipine on file but we are unable to identify if this was a current medication with him.  Levaquin and Prednisone were prescribed without refills and these therapies should have been completed.  I have updated his admission record to reflect this.  Plan:  I have marked this record as complete.  Please resume those medications you feel most appropriate for care.  Rober Minion, PharmD., MS Clinical Pharmacist Pager:  [phone] Thank you for allowing pharmacy to be part of this patients care team. 02/25/2017,2:10 PM
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
## 2017-02-25 NOTE — Progress Notes (Signed)
Occupational Therapy Session Note  Patient Details  Name: Arthur Beasley MRN: [id number] Date of Birth: 14-Jun-1981  Today's Date: 02/25/2017 OT Individual Time: 0900-1000 OT Individual Time Calculation (min): 60 min    Short Term Goals: Week 1:  OT Short Term Goal 1 (Week 1): Pt will be able to sit at EOB with min A while using LUE to wash RUE. OT Short Term Goal 2 (Week 1): Pt will be able to stand with mod A of one person and use his LUE to pull his pants over his hips at least 50% of the way. OT Short Term Goal 3 (Week 1): Pt will transfer to a BSC with mod A of one person. OT Short Term Goal 4 (Week 1): Pt will use his L hand to self cleanse after toileting with min A. OT Short Term Goal 5 (Week 1): Pt will self range his RUE with min A.  Skilled Therapeutic Interventions/Progress Updates:    Pt engaged in BADL retraining including bathing/dressing with sit<>stand from w/c at sink.  Pt resting in bed upon arrival and sat EOB with min A in preparation for stand pivot transfer to w/c with min A.  Pt initiated bathing tasks when presented with supplies but perseverated washing face and chest.  Pt required max multimodal cues to sequence and transition. Pt performed sit<>stand with min A and required mod A for standing balance.  Pt required max multimodal cues for weight transfers in standing with R knee blocked.  Pt transitioned to [employer] and engaged in [place] tasks with R inattention noted.  Pt required max verbal cues to locate lights on R half of board.  Pt returned to room and remained in TIS w/c with RN present.  Pt O2 sats >93% on RA during therapy session.   Therapy Documentation Precautions:  Precautions Precautions: Fall Precaution Comments: left flap, no bone graft; R lean in sitting without LUE support Restrictions Weight Bearing Restrictions: No Pain:  Pt denied pain  See Function Navigator for Current Functional Status.   Therapy/Group: Individual  Therapy  Leroy Libman 02/25/2017, 10:04 AM

## 2017-02-25 NOTE — Progress Notes (Signed)
Physical Therapy Session Note  Patient Details  Name: Arthur Beasley MRN: [id number] Date of Birth: 11/14/1981  Today's Date: 02/25/2017 PT Individual Time: 6213-0865 PT Individual Time Calculation (min): 75 min   Short Term Goals: Week 1:  PT Short Term Goal 1 (Week 1): Pt will perform bed mobility with mod assist  PT Short Term Goal 2 (Week 1): Pt will perform bed<>chair transfer with mod assist  PT Short Term Goal 3 (Week 1): Pt will ambulate >64ft with max assist and LRAD PT Short Term Goal 4 (Week 1): Pt will propel WC 19ft with mod assist.  PT Short Term Goal 5 (Week 1): Pt will maintain dynamic sitting balance with Mod assist while performing reaching tasks.    Skilled Therapeutic Interventions/Progress Updates: Pt received semi-reclined in bed, denies pain and agreeable to treatment. Supine>sit EOB with modA for trunk control and RLE management. Transfer bed>w/c with modA stand pivot. W/c propulsion in 18x16 w/c with L hemi technique, minA and max verbal/tactile cues initially, faded to S. Gait RUE over therapist's shoulder x100' with maxA, +2 for safety and w/c follow. Required assist for RLE progression, reduce scissoring and for stance control. Squat pivot transfer w/c <>mat table modA. Standing balance LLE toe taps to 3" step for RLE weight bearing and stance control; maxA for RLE management in standing with improved weight shifting and RLE activation with repetition. Sit <>stand with RW and R hand splint x2 trials; standing tolerance of 1-2 min per trial with cues for L weight shift and RLE activation. Returned to bed modA squat pivot. Sit >supine minA for RLE management. Remained semi-reclined in bed at end of session, alarm intact and all needs in reach.      Therapy Documentation Precautions:  Precautions Precautions: Fall Precaution Comments: left flap, no bone graft; R lean in sitting without LUE support Restrictions Weight Bearing Restrictions: No   See Function  Navigator for Current Functional Status.   Therapy/Group: Individual Therapy  Luberta Mutter 02/25/2017, 3:13 PM

## 2017-02-25 NOTE — Progress Notes (Signed)
Physical Therapy Session Note  Patient Details  Name: Arthur Beasley MRN: [id number] Date of Birth: Feb 19, 1981  Today's Date: 02/25/2017 PT Individual Time: 3267-1245 PT Individual Time Calculation (min): 29 min   Short Term Goals: Week 1:  PT Short Term Goal 1 (Week 1): Pt will perform bed mobility with mod assist  PT Short Term Goal 2 (Week 1): Pt will perform bed<>chair transfer with mod assist  PT Short Term Goal 3 (Week 1): Pt will ambulate >55ft with max assist and LRAD PT Short Term Goal 4 (Week 1): Pt will propel WC 112ft with mod assist.  PT Short Term Goal 5 (Week 1): Pt will maintain dynamic sitting balance with Mod assist while performing reaching tasks.    Skilled Therapeutic Interventions/Progress Updates:    Pt up in TIS w/c upon arrival, gives thumbs up regarding PT. Gait training performed in hall with +2 assist, HHA on Lt and Rt arm reaching over PT shoulder. Ambulating 50 ft X2 with physical assist needed to assist with advancing Rt LE and maintaining knee extension in stance phase. Without assist, pt scissoring Rt LE across midline and unable to place on floor for weightbearing. Seated rest taken between ambulation attempts. Static standing balance performed with manual assist for Rt knee extension and weight shift to Rt. Cues provided throughout session for posture and head position. After session pt up in TIS w/c with safety belt on and call light in hand.    Therapy Documentation Precautions:  Precautions Precautions: Fall Precaution Comments: left flap, no bone graft; R lean in sitting without LUE support Restrictions Weight Bearing Restrictions: No    Vital Signs: SpO2 100% on RA after ambulation.  Pain: Denies pain  See Function Navigator for Current Functional Status.   Therapy/Group: Individual Therapy  Linard Millers, PT 02/25/2017, 12:50 PM

## 2017-02-25 NOTE — Progress Notes (Signed)
Social Work Patient ID: Arthur Beasley, male   DOB: 17-Dec-1980, 36 y.o.   MRN: [id number]   Met yesterday with pt, sister, brother and niece to review team conference.  All aware and agreeable with ELOS of 4 weeks.  Discussed possibility that pt will d/c with trach still in place and that they will need to be trained on care.  Discussed goals of min assist overall for mobility and ADLs.  RN able to add information about his secretions.  Explained that MD will follow up on ENT plans moving forward for jaw/ wiring.  Pt listening attentively and nodding appropriately.  Actually responding to humor between all in the room.  Will continue to follow.  Yoel Kaufhold, LCSW

## 2017-02-25 NOTE — Plan of Care (Signed)
Problem: RH BOWEL ELIMINATION Goal: RH STG MANAGE BOWEL WITH ASSISTANCE STG Manage Bowel with mod Assistance.   Outcome: Not Progressing Patient requires max assist and incontinent

## 2017-02-25 NOTE — Progress Notes (Signed)
Speech Language Pathology Daily Session Note  Patient Details  Name: Arthur Beasley MRN: [id number] Date of Birth: [birth year]  Today's Date: 02/25/2017 SLP Individual Time: 1000-1100 SLP Individual Time Calculation (min): 60 min  Short Term Goals: Week 1: SLP Short Term Goal 1 (Week 1): Pt will use multimodal communication to communicate wants and needs in 50% of opportunities with Mod A multimodal cues.  SLP Short Term Goal 2 (Week 1): Pt will follow 1 step directions with >90% and Min A verbal cues.  SLP Short Term Goal 3 (Week 1): Pt will tolerate PMSV placement for ~ 5 minutes without decreased in vitals or pt request for PMSV to be removeda.  SLP Short Term Goal 4 (Week 1): Pt will produce voicing with Mod A verbal cues for 50% of opportunites and Max A multimodal cues.  SLP Short Term Goal 5 (Week 1): Pt will answer basic biographical questions with gestures or selection of written choices with 75% accuarcy and Mod A cues.  SLP Short Term Goal 6 (Week 1): Given Max A multimodal cues, pt will produce swallow in 25% of opportunites.   Skilled Therapeutic Interventions: Skilled treatment session focused on cognition goals. SLP placed PMSV at beginning of session and pt tolerated without decline in vitals for 30 minutes. Pt didn't cough any secretions through trach hub during that time. Pt able to select written choices in field of 2 for biographical and orientation information with supervision cues and >90% accuracy. SLP facilitated lip movement my manually closing pt's lips and by having him manually close lips with his left hand. Some lip movement noted with verbal cues after physical stimulation. Pt selected song of his choice and was given Max multimodal cues to hum with song. Pt responded with increased audible airflow in response to short rhythem of the song. SLP removed PMSV at end of session. Pt was left upright in wheelchair, safety belt donned and all needs within reach. Continue  per current plan of care.      Function:    Cognition Comprehension Comprehension assist level: Understands basic 75 - 89% of the time/ requires cueing 10 - 24% of the time  Expression Expression assistive device: Talk trach valve Expression assist level: Expresses basis less than 25% of the time/requires cueing >75% of the time.  Social Interaction Social Interaction assist level: Interacts appropriately 50 - 74% of the time - May be physically or verbally inappropriate.  Problem Solving Problem solving assist level: Solves basic 50 - 74% of the time/requires cueing 25 - 49% of the time  Memory Memory assist level: Recognizes or recalls 50 - 74% of the time/requires cueing 25 - 49% of the time    Pain Pain Assessment Pain Score: 8   Therapy/Group: Individual Therapy   Ashna Dorough B. Rutherford Nail, M.S., CCC-SLP Speech-Language Pathologist   Drey Shaff 02/25/2017, 12:17 PM

## 2017-02-25 NOTE — Plan of Care (Signed)
Problem: RH BLADDER ELIMINATION Goal: RH STG MANAGE BLADDER WITH EQUIPMENT WITH ASSISTANCE STG Manage Bladder With Equipment With mod Assistance   Outcome: Not Progressing Total assist

## 2017-02-26 ENCOUNTER — Inpatient Hospital Stay (HOSPITAL_COMMUNITY): Payer: Self-pay | Admitting: Physical Therapy

## 2017-02-26 ENCOUNTER — Inpatient Hospital Stay (HOSPITAL_COMMUNITY): Payer: Medicaid Other

## 2017-02-26 ENCOUNTER — Inpatient Hospital Stay (HOSPITAL_COMMUNITY): Payer: Medicaid Other | Admitting: Speech Pathology

## 2017-02-26 ENCOUNTER — Inpatient Hospital Stay (HOSPITAL_COMMUNITY): Payer: Self-pay

## 2017-02-26 LAB — GLUCOSE, CAPILLARY
GLUCOSE-CAPILLARY: 109 mg/dL — AB (ref 65–99)
GLUCOSE-CAPILLARY: 66 mg/dL (ref 65–99)
GLUCOSE-CAPILLARY: 95 mg/dL (ref 65–99)
Glucose-Capillary: 99 mg/dL (ref 65–99)
Glucose-Capillary: 113 mg/dL — ABNORMAL HIGH (ref 65–99)

## 2017-02-26 LAB — BASIC METABOLIC PANEL
Anion gap: 8 (ref 5–15)
BUN: 15 mg/dL (ref 6–20)
CALCIUM: 9.5 mg/dL (ref 8.9–10.3)
CO2: 31 mmol/L (ref 22–32)
Chloride: 97 mmol/L — ABNORMAL LOW (ref 101–111)
Creatinine, Ser: 0.79 mg/dL (ref 0.61–1.24)
GFR calc Af Amer: >60 mL/min (ref >60)
GLUCOSE: 111 mg/dL — AB (ref 65–99)
Potassium: 4.3 mmol/L (ref 3.5–5.1)
SODIUM: 136 mmol/L (ref 135–145)

## 2017-02-26 LAB — CBC
HCT: 30.8 % — ABNORMAL LOW (ref 39.0–52.0)
Hemoglobin: 10.3 g/dL — ABNORMAL LOW (ref 13.0–17.0)
MCH: 29.3 pg (ref 26.0–34.0)
MCHC: 33.4 g/dL (ref 30.0–36.0)
MCV: 87.7 fL (ref 78.0–100.0)
PLATELETS: 322 10*3/uL (ref 150–400)
RBC: 3.51 MIL/uL — ABNORMAL LOW (ref 4.22–5.81)
RDW: 14.5 % (ref 11.5–15.5)
WBC: 8.4 10*3/uL (ref 4.0–10.5)

## 2017-02-26 NOTE — Progress Notes (Addendum)
Pt family arrived around 32 with questions related to the patient falling. The Surveyor, quantity and I explained to the pt sister the circumstances related to the fall and she verbalized understanding. Sister was very concerned, but understood the new interventions that were put in place to prevent another fall occurrence. Will cont to educate patient and family regarding safety. Yamel Bale, Dione Plover

## 2017-02-26 NOTE — Progress Notes (Signed)
Pt is resting comfortably at this time.

## 2017-02-26 NOTE — Progress Notes (Signed)
Patient found on the floor. Felt likely to be reaching for something and chair tilted frontwards? Indicating pain right shoulder and scapular with ROM/palpation. Keeping head flexed to right-- discomfort with attempts to bring head to midline. Neck without step off and minimal tenderness.   Followed up on patient about 45 minutes later--noted patient turning head to midline. Nurse to continue to monitor.

## 2017-02-26 NOTE — Progress Notes (Signed)
Occupational Therapy Session Note  Patient Details  Name: Arthur Beasley MRN: [id number] Date of Birth: Mar 04, 1981  Today's Date: 02/26/2017 OT Individual Time: 7034-0352 OT Individual Time Calculation (min): 55 min    Short Term Goals: Week 2:  OT Short Term Goal 1 (Week 2): Pt will self range his RUE with min A. OT Short Term Goal 2 (Week 2): Pt will use his L hand to self cleanse after toileting with min A. OT Short Term Goal 3 (Week 2): Pt will perform will perform bathing tasks with mod a with sit<>stand from w/c at sink OT Short Term Goal 4 (Week 2): Pt will thread BLE into pants with mod A while seated in w/c. OT Short Term Goal 5 (Week 2): Pt will don pullover shirt with mod A while seated in w/c   Skilled Therapeutic Interventions/Progress Updates:    Pt resting in bed upon arrival and agreeable to participating in therapy.  Pt engaged in BADLs including bathing/dressing with sit<>stand from w/c at sink.  Pt sat EOB with min A and performed stand pivot transfer with mod A to w/c.  Pt initiated bathing tasks when presented with supplies but required mod verbal cues for sequencing and transitions.  Pt initiated dressing tasks when presented with clothing items.  Pt exhibits some apraxia during dressing tasks and attempted to don his shirt over his feet/legs.  Pt continues to required max multimodal cues to attend to his R and bathe his RUE and RLE.  Pt remained in [place] w/c with QRB in place and all needs within reach.   Therapy Documentation Precautions:  Precautions Precautions: Fall Precaution Comments: left flap, no bone graft; R lean in sitting without LUE support Restrictions Weight Bearing Restrictions: No Pain:  Pt denied pain  See Function Navigator for Current Functional Status.   Therapy/Group: Individual Therapy  Leroy Libman 02/26/2017, 9:34 AM

## 2017-02-26 NOTE — Progress Notes (Signed)
Occupational Therapy Note  Patient Details  Name: Arthur Beasley MRN: [id number] Date of Birth: 1980-12-06  Today's Date: 02/26/2017 OT Missed Time: 57 Minutes Missed Time Reason: Nursing care  Pt missed 30 mins skilled OT services secondary to ongoing nursing care. [name] [facility] 02/26/2017, 2:41 PM

## 2017-02-26 NOTE — Plan of Care (Signed)
Problem: RH BOWEL ELIMINATION Goal: RH STG MANAGE BOWEL W/MEDICATION W/ASSISTANCE STG Manage Bowel with Medication with  [place].   Outcome: Not Progressing LBM 02-21-17

## 2017-02-26 NOTE — Progress Notes (Signed)
Physical Therapy Session Note  Patient Details  Name: Arthur Beasley MRN: [id number] Date of Birth: January 21, 1981  Today's Date: 02/26/2017   Short Term Goals: Week 1:  PT Short Term Goal 1 (Week 1): Pt will perform bed mobility with mod assist  PT Short Term Goal 2 (Week 1): Pt will perform bed<>chair transfer with mod assist  PT Short Term Goal 3 (Week 1): Pt will ambulate >25ft with max assist and LRAD PT Short Term Goal 4 (Week 1): Pt will propel WC 13ft with mod assist.  PT Short Term Goal 5 (Week 1): Pt will maintain dynamic sitting balance with Mod assist while performing reaching tasks.    Skilled Therapeutic Interventions/Progress Updates: Pt off unit for first 30 min of session; when pt returned he declined participation in session despite being offered multiple options of things to work on. Family and therapist encouraged pt to participate so they could see his progress but pt continued to decline. Will follow up per POC.      Therapy Documentation Precautions:  Precautions Precautions: Fall Precaution Comments: left flap, no bone graft; R lean in sitting without LUE support Restrictions Weight Bearing Restrictions: No General: PT Amount of Missed Time (min): 60 Minutes PT Missed Treatment Reason: Xray;Patient unwilling to participate   See Function Navigator for Current Functional Status.   Therapy/Group: Individual Therapy  Luberta Mutter 02/26/2017, 3:09 PM

## 2017-02-26 NOTE — Significant Event (Addendum)
Hypoglycemic Event  CBG: 66  Treatment: 15 GM carbohydrate snack  Symptoms: None  Follow-up CBG: Time:1342 CBG Result:99  Possible Reasons for Event: Inadequate meal intake  Comments/MD notified:Pt stable, no changes noted in condition, 0800 Jevity bolus was not given, may have caused low CBG    Nikkole Placzek, Dione Plover

## 2017-02-26 NOTE — Significant Event (Signed)
Around 1130, environmental staff yelled to nsg station that the patient had fell to the floor. Multiple staff members entered the room including myself and found the patient on the floor with wheelchair on top of him, R leg underneath the leg rest and laying on R shoulder and R side of head with helmet in place. Assisted patient back to bed via maxi move without difficulty. VS taken and stable. PA arrived to room while getting patient back to bed and assessed patient when in the bed. Pt c/o pain to R shoulder, and R pelvic area. Multiple orders written by PA for CT and X-ray. Noted slightly reddened area to RLQ, otherwise skin dry and intact. Pt sister Arthur Beasley notified at 29 and verbalized understanding and stated she would be up to the unit soon. Will cont to monitor patient. Arthur Beasley, Dione Plover

## 2017-02-26 NOTE — Plan of Care (Signed)
Problem: RH BOWEL ELIMINATION Goal: RH STG MANAGE BOWEL WITH ASSISTANCE STG Manage Bowel with mod Assistance.   Outcome: Not Progressing LBM 02-21-17
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
## 2017-02-26 NOTE — Progress Notes (Signed)
Speech Language Pathology Daily Session Note  Patient Details  Name: Arthur Beasley MRN: [id number] Date of Birth: Apr 13, 1981  Today's Date: 02/26/2017 SLP Individual Time: 1000-1100 SLP Individual Time Calculation (min): 60 min  Short Term Goals: Week 1: SLP Short Term Goal 1 (Week 1): Pt will use multimodal communication to communicate wants and needs in 50% of opportunities with Mod A multimodal cues.  SLP Short Term Goal 2 (Week 1): Pt will follow 1 step directions with >90% and Min A verbal cues.  SLP Short Term Goal 3 (Week 1): Pt will tolerate PMSV placement for ~ 5 minutes without decreased in vitals or pt request for PMSV to be removeda.  SLP Short Term Goal 4 (Week 1): Pt will produce voicing with Mod A verbal cues for 50% of opportunites and Max A multimodal cues.  SLP Short Term Goal 5 (Week 1): Pt will answer basic biographical questions with gestures or selection of written choices with 75% accuarcy and Mod A cues.  SLP Short Term Goal 6 (Week 1): Given Max A multimodal cues, pt will produce swallow in 25% of opportunites.   Skilled Therapeutic Interventions: Skilled treatment session focused on cognition goals. SLP placed PMSV upon arrival and took pt wot dayroom without O2. Pt's O2 remained >95% during 60 minute session - decreased secretions noted. SLP facilitated session by providing pt's printed name. Pt able to write multiple times and wrote his full name and his nickname without visual representation. Pt able to approximate labial seal after physical/tactile cues. With Mod A verbal cues, pt able to obtain labial closure and maintain seal for count of 10 x 5 opportunities. Pt able to select correct information (even date) with supervision question cues. Pt able to place printed name with pictures of children with supervision question cues. Pt attempted to voice as evidenced by paced exhalation of air and facial indication that he was attempting to verbally communicate. No  voicing heard. No humming with music this session. At 2 points in session, pt's exhalation became wet, pt agreeable that he "sounded wet" and with Max A he attempted throat clears x 5 with increased ability to move secretions (not effectively throat clear but increased motor initiation). Continue to question possible apraxia as well as language deficits. Pt was returned to room, left upright in tilt in space wheelchair tiled back and safety belt donned. Pt gestured that he wanted O2 placed, TV remote in lap, O2 sensor connected to finger, photo album placed within reach and PMSV removed, SLP executed and left with all needs within reach. Pt able to indicate which button was the call light. Continue per current plan of care.   Function:    Cognition Comprehension Comprehension assist level: Understands basic 75 - 89% of the time/ requires cueing 10 - 24% of the time  Expression Expression assistive device: Talk trach valve Expression assist level: Expresses basis less than 25% of the time/requires cueing >75% of the time.  Social Interaction Social Interaction assist level: Interacts appropriately 50 - 74% of the time - May be physically or verbally inappropriate.  Problem Solving Problem solving assist level: Solves basic 50 - 74% of the time/requires cueing 25 - 49% of the time  Memory Memory assist level: Recognizes or recalls 50 - 74% of the time/requires cueing 25 - 49% of the time    Pain    Therapy/Group: Individual Therapy   Kapil Petropoulos B. Rutherford Nail, M.S., CCC-SLP Speech-Language Pathologist   Dijon Kohlman 02/26/2017, 12:22 PM

## 2017-02-26 NOTE — Progress Notes (Signed)
Occupational Therapy Weekly Progress Note  Patient Details  Name: Arthur Beasley MRN: [id number] Date of Birth: 1981/08/09  Beginning of progress report period: February 20, 2016 End of progress report period: February 27, 2016  Patient has met 3 of 5 short term goals.  Pt is making slow but steady progress with sitting balance, standing balance, and BADLs.  Pt initiates bathing and dressing tasks but requires mod/max verbal cues for sequencing and attention to R.  Pt requires HOH assistance to incorporate his RUE into functional tasks.  Pt requires max A to shift weight onto RLE with support at his knee.    Patient continues to demonstrate the following deficits: muscle weakness, decreased cardiorespiratoy endurance and decreased oxygen support, abnormal tone and motor apraxia, decreased visual perceptual skills, decreased visual motor skills and field cut, decreased midline orientation and decreased attention to right, decreased attention, decreased awareness, decreased problem solving, decreased safety awareness, decreased memory and delayed processing and decreased sitting balance, decreased standing balance, decreased postural control, hemiplegia and decreased balance strategies and therefore will continue to benefit from skilled OT intervention to enhance overall performance with BADL.  Patient progressing toward long term goals..  Continue plan of care.  OT Short Term Goals Week 1:  OT Short Term Goal 1 (Week 1): Pt will be able to sit at EOB with min A while using LUE to wash RUE. OT Short Term Goal 1 - Progress (Week 1): Met OT Short Term Goal 2 (Week 1): Pt will be able to stand with mod A of one person and use his LUE to pull his pants over his hips at least 50% of the way. OT Short Term Goal 2 - Progress (Week 1): Met OT Short Term Goal 3 (Week 1): Pt will transfer to a BSC with mod A of one person. OT Short Term Goal 3 - Progress (Week 1): Met OT Short Term Goal 4 (Week 1): Pt will  use his L hand to self cleanse after toileting with min A. OT Short Term Goal 4 - Progress (Week 1): Progressing toward goal OT Short Term Goal 5 (Week 1): Pt will self range his RUE with min A. OT Short Term Goal 5 - Progress (Week 1): Progressing toward goal Week 2:  OT Short Term Goal 1 (Week 2): Pt will self range his RUE with min A. OT Short Term Goal 2 (Week 2): Pt will use his L hand to self cleanse after toileting with min A. OT Short Term Goal 3 (Week 2): Pt will perform will perform bathing tasks with mod a with sit<>stand from w/c at sink OT Short Term Goal 4 (Week 2): Pt will thread BLE into pants with mod A while seated in w/c. OT Short Term Goal 5 (Week 2): Pt will don pullover shirt with mod A while seated in w/c       Therapy Documentation Precautions:  Precautions Precautions: Fall Precaution Comments: left flap, no bone graft; R lean in sitting without LUE support Restrictions Weight Bearing Restrictions: No  See Function Navigator for Current Functional Status.     [name] [facility] 02/26/2017, 6:54 AM

## 2017-02-27 ENCOUNTER — Inpatient Hospital Stay (HOSPITAL_COMMUNITY): Payer: Self-pay | Admitting: Physical Therapy

## 2017-02-27 ENCOUNTER — Inpatient Hospital Stay (HOSPITAL_COMMUNITY): Payer: Self-pay

## 2017-02-27 ENCOUNTER — Inpatient Hospital Stay (HOSPITAL_COMMUNITY): Payer: Medicaid Other | Admitting: Speech Pathology

## 2017-02-27 LAB — GLUCOSE, CAPILLARY
GLUCOSE-CAPILLARY: 126 mg/dL — AB (ref 65–99)
GLUCOSE-CAPILLARY: 94 mg/dL (ref 65–99)
Glucose-Capillary: 137 mg/dL — ABNORMAL HIGH (ref 65–99)
Glucose-Capillary: 100 mg/dL — ABNORMAL HIGH (ref 65–99)
Glucose-Capillary: 99 mg/dL (ref 65–99)

## 2017-02-27 MED ORDER — INSULIN ASPART 100 UNIT/ML ~~LOC~~ SOLN
0.0000 [IU] | Freq: Four times a day (QID) | SUBCUTANEOUS | Status: DC
  Administered 2017-02-28 – 2017-03-05 (×4): 1 [IU] via SUBCUTANEOUS

## 2017-02-27 NOTE — Progress Notes (Signed)
Patient complaining of pain at the site of the middle screw in the lower gum. Site appears reddened and the screw appears to have irritated the tissue. A slight amount of blood was noted int the patient's sputum. Algis Liming, PA was notified.

## 2017-02-27 NOTE — Progress Notes (Signed)
Physical Therapy Session Note  Patient Details  Name: Arthur Beasley MRN: [id number] Date of Birth: 30-Sep-1981  Today's Date: 02/27/2017 PT Individual Time: 1130-1155 PT Individual Time Calculation (min): 25 min   Short Term Goals: Week 2:  PT Short Term Goal 1 (Week 2): Pt will perform bed mobility minA PT Short Term Goal 2 (Week 2): Pt will perform transfers minA w/c <>bed PT Short Term Goal 3 (Week 2): Pt will ambulate >75' modA and LRAD PT Short Term Goal 4 (Week 2): Pt will propel w/c 150' with L hemi technique and S PT Short Term Goal 5 (Week 2): Pt will initiate stair training Week 3:     Skilled Therapeutic Interventions/Progress Updates:   Pt received supine in bed and agreeable to PT. Supine>sit transfer with mod assist from PT moderate cues for use of bed rails.   Sit<>stand with mod assist from PT at EOB x 4. Standing balance with mod assist from PT to block the RLE and encourage weight bearing over the LLE. Pt demonstrates mild pushing tendencies  in standing with decreased acceptance of WC on the LLE.  Pt instructed in building pipe tree x 2 with max cues for L lateral reach in standing and visual scanning to refer to reference picture and improved completion of R side of pipe tree.   Pt returned to supine with mod assist and left in bed with call bell in reach and 4 rails up per pt required.       Therapy Documentation Precautions:  Precautions Precautions: Fall Precaution Comments: left flap, no bone graft; Helmet when OOB; R lean in sitting without LUE support Restrictions Weight Bearing Restrictions: No General:   Vital Signs: Therapy Vitals Temp: 98.1 F (36.7 C) Temp Source: Oral Pulse Rate: 98 Resp: 16 BP: 113/77 Patient Position (if appropriate): Lying Oxygen Therapy SpO2: 94 % Pain: Faces: no pain.   See Function Navigator for Current Functional Status.   Therapy/Group: Individual Therapy  Lorie Phenix 02/27/2017, 5:22 PM

## 2017-02-27 NOTE — Progress Notes (Signed)
Physical Therapy Weekly Progress Note  Patient Details  Name: Arthur Beasley MRN: [id number] Date of Birth: 08-07-1981  Beginning of progress report period: February 20, 2017 End of progress report period: February 27, 2017  Today's Date: 02/27/2017 PT Individual Time: 1000-1100 PT Individual Time Calculation (min): 60 min   Patient has met 5 of 5 short term goals.  Pt currently requires modA for bed mobility, modA for stand and squat pivot transfers, and maxA for gait d/t R hemiparesis and impaired midline perception. Pt actively participatory in therapy sessions, following multi-step commands and able to accurately respond to yes/no questions with regard to care. Will require family education prior to d/c.   Patient continues to demonstrate the following deficits muscle weakness and muscle paralysis, decreased cardiorespiratoy endurance, abnormal tone, unbalanced muscle activation, motor apraxia, ataxia, decreased coordination and decreased motor planning, decreased attention to right, right side neglect and decreased motor planning, decreased awareness, decreased problem solving and decreased safety awareness and decreased sitting balance, decreased standing balance, decreased postural control, hemiplegia and decreased balance strategies and therefore will continue to benefit from skilled PT intervention to increase functional independence with mobility.  Patient progressing toward long term goals..  Continue plan of care.  PT Short Term Goals Week 1:  PT Short Term Goal 1 (Week 1): Pt will perform bed mobility with mod assist  PT Short Term Goal 1 - Progress (Week 1): Met PT Short Term Goal 2 (Week 1): Pt will perform bed<>chair transfer with mod assist  PT Short Term Goal 2 - Progress (Week 1): Met PT Short Term Goal 3 (Week 1): Pt will ambulate >61f with max assist and LRAD PT Short Term Goal 3 - Progress (Week 1): Met PT Short Term Goal 4 (Week 1): Pt will propel WC [id number] mod  assist.  PT Short Term Goal 4 - Progress (Week 1): Met PT Short Term Goal 5 (Week 1): Pt will maintain dynamic sitting balance with Mod assist while performing reaching tasks.   PT Short Term Goal 5 - Progress (Week 1): Met Week 2:  PT Short Term Goal 1 (Week 2): Pt will perform bed mobility minA PT Short Term Goal 2 (Week 2): Pt will perform transfers minA w/c <>bed PT Short Term Goal 3 (Week 2): Pt will ambulate >75' modA and LRAD PT Short Term Goal 4 (Week 2): Pt will propel w/c 150' with L hemi technique and S PT Short Term Goal 5 (Week 2): Pt will initiate stair training  Skilled Therapeutic Interventions/Progress Updates: Pt received supine in bed, denies pain and agreeable to treatment. Supine>sit modA for trunk control. Stand pivot transfer w/c <>bed with modA and cues for technique to complete turn prior to sitting. W/c propulsion L hemi technique and S, min cues for navigation. Gait x75' with maxA, RUE over therapist's shoulder, assist for RLE placement to reduce scissoring however improving LE progression without assist. Limited by drop foot. Second gait trial x50' with ace wrap dorsiflexion assist RLE with improved foot clearance and placement; cues for LLE stepping past RLE for increased RLE stance time and weight bearing. Standing balance LUE reaching to L side for focus on L weight shifting to improve RLE foot clearance in gait, and postural control. When returning to midline pt continues to demo poor stance control RLE for neutral posture standing. Standing LLE toe taps to 3" step; maxA and multimodal cues for RLE stance control. RLE taps to step with maxA for continued focus on L weight shifting.  Gait x50' maxA, max cues for LLE step length. Returned to bed modA stand pivot as above. Remained supine in bed with alarm intact and all needs in reach.      Therapy Documentation Precautions:  Precautions Precautions: Fall Precaution Comments: left flap, no bone graft; R lean in sitting  without LUE support Restrictions Weight Bearing Restrictions: No   See Function Navigator for Current Functional Status.  Therapy/Group: Individual Therapy  Luberta Mutter 02/27/2017, 11:04 AM

## 2017-02-27 NOTE — Progress Notes (Signed)
Occupational Therapy Session Note  Patient Details  Name: Arthur Beasley MRN: [id number] Date of Birth: 20-Jul-1981  Today's Date: 02/27/2017 OT Individual Time: 1300-1330 OT Individual Time Calculation (min): 30 min    Short Term Goals: Week 2:  OT Short Term Goal 1 (Week 2): Pt will self range his RUE with min A. OT Short Term Goal 2 (Week 2): Pt will use his L hand to self cleanse after toileting with min A. OT Short Term Goal 3 (Week 2): Pt will perform will perform bathing tasks with mod a with sit<>stand from w/c at sink OT Short Term Goal 4 (Week 2): Pt will thread BLE into pants with mod A while seated in w/c. OT Short Term Goal 5 (Week 2): Pt will don pullover shirt with mod A while seated in w/c   Skilled Therapeutic Interventions/Progress Updates:    Pt asleep in bed upon arrival.  Pt required mod multimodal cues to arouse.  Focus on bed mobility, sitting balance, attention to R, and safety awareness.  Pt sat EOB with min A and engaged in table activities with focus on locating items in R field of vision, reaching with LUE across midline while maintaining sitting balance.  Pt required min a for dynamic sitting balance.  Pt required min verbal cues to locate in far R field of vision (cues to scan to his R). Pt returned to supine with HOB elevated (min A to lift RLE into bed). Pt remained in bed with bed alarm activated and all needs within reach.   Therapy Documentation Precautions:  Precautions Precautions: Fall Precaution Comments: left flap, no bone graft; R lean in sitting without LUE support Restrictions Weight Bearing Restrictions: No Pain:  Pt denied pain  See Function Navigator for Current Functional Status.   Therapy/Group: Individual Therapy  Leroy Libman 02/27/2017, 2:42 PM

## 2017-02-27 NOTE — Progress Notes (Signed)
Occupational Therapy Session Note  Patient Details  Name: Arthur Beasley MRN: [id number] Date of Birth: Aug 09, 1981  Today's Date: 02/27/2017 OT Individual Time: 0700-0800 OT Individual Time Calculation (min): 60 min    Short Term Goals: Week 2:  OT Short Term Goal 1 (Week 2): Pt will self range his RUE with min A. OT Short Term Goal 2 (Week 2): Pt will use his L hand to self cleanse after toileting with min A. OT Short Term Goal 3 (Week 2): Pt will perform will perform bathing tasks with mod a with sit<>stand from w/c at sink OT Short Term Goal 4 (Week 2): Pt will thread BLE into pants with mod A while seated in w/c. OT Short Term Goal 5 (Week 2): Pt will don pullover shirt with mod A while seated in w/c   Skilled Therapeutic Interventions/Progress Updates:    Focus on BADL retraining, sitting balance, functional transfers, standing balance, task initiation, sequencing, attention to R, and safety awareness to increase independence with BADLs. Pt resting in bed upon arrival.  Pt required min A for supine>sit EOB in preparation for stand pivot transfer to w/c.  Pt exhibits impulsive behaviors with transitional movements and required max multimodal cues for safety.  Pt engaged in BADL retaining including bathing/dressing with sit<>stand from w/c at sink.  Pt initiates tasks but required max multimodal cues for sequencing and thoroughness.  Pt requires max A to attend to RUE and RLE during bathing tasks.  Pt attempted to don his shirt over BLE.  Pt requires mod A for standing balance during LB bathing tasks and to pull up pants.  Pt returned to bed at end of session and remained in bed with all needs within reach and bed alarm activated.   Therapy Documentation Precautions:  Precautions Precautions: Fall Precaution Comments: left flap, no bone graft; Helmet when OOB; R lean in sitting without LUE support Restrictions Weight Bearing Restrictions: No Pain:  Pt denied pain  See Function  Navigator for Current Functional Status.   Therapy/Group: Individual Therapy  Leroy Libman 02/27/2017, 2:49 PM
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
## 2017-02-27 NOTE — Progress Notes (Signed)
Speech Language Pathology Weekly Progress and Session Note  Patient Details  Name: Arthur Beasley MRN: [id number] Date of Birth: [birth year]  Beginning of progress report period: February 20, 2017 End of progress report period: February 25, 2017  Today's Date: 02/27/2017 SLP Individual Time: 1400-1500 SLP Individual Time Calculation (min): 60 min  Short Term Goals: Week 1: SLP Short Term Goal 1 (Week 1): Pt will use multimodal communication to communicate wants and needs in 50% of opportunities with Mod A multimodal cues.  SLP Short Term Goal 1 - Progress (Week 1): Not met SLP Short Term Goal 2 (Week 1): Pt will follow 1 step directions with >90% and Min A verbal cues.  SLP Short Term Goal 2 - Progress (Week 1): Met SLP Short Term Goal 3 (Week 1): Pt will tolerate PMSV placement for ~ 5 minutes without decreased in vitals or pt request for PMSV to be removeda.  SLP Short Term Goal 3 - Progress (Week 1): Met SLP Short Term Goal 4 (Week 1): Pt will produce voicing with Mod A verbal cues for 50% of opportunites and Max A multimodal cues.  SLP Short Term Goal 4 - Progress (Week 1): Not met SLP Short Term Goal 5 (Week 1): Pt will answer basic biographical questions with gestures or selection of written choices with 75% accuarcy and Mod A cues.  SLP Short Term Goal 5 - Progress (Week 1): Met SLP Short Term Goal 6 (Week 1): Given Max A multimodal cues, pt will produce swallow in 25% of opportunites.  SLP Short Term Goal 6 - Progress (Week 1): Not met    New Short Term Goals: Week 2: SLP Short Term Goal 1 (Week 2): Pt will use multimodal communication to communicate wants and needs in 50% of opportunities with Mod A multimodal cues.  SLP Short Term Goal 2 (Week 2): Patient will tolerate PMSV with full supervision with all vitals remaining [facility] SLP Short Term Goal 3 (Week 2): Patient will initiate a swallow with saliva on command in 25% of opportunities and Max A multimodal cues. SLP Short Term Goal  4 (Week 2): Pt will hum with SLP model and Max multimodal cues.  SLP Short Term Goal 5 (Week 2): Pt will achieve and maintain labial seal for 1 minute with Mod A multimodal cues.   Weekly Progress Updates: Pt has made good progress this reporting period and as a result has met 3 of 6 STGs. Pt with progress in tolerating PMSV for ~ 60 minutes without decline in vitals, emerging attempts to mouth words, emerging attempts to produce throat clear/cough, answering basic yes/no questions (head gestures), object identification/selection in field of 3 objects, pairing objects with written word, writing his name with visual model, selecting appropriate written choices for all orientation information as well as biographical information related to his children and following 1 step directions. Pt continues with deficits in the areas of tolerating PMSV in all therapy sessions, consistently producing voice, safety awareness/awareness of deficits, swallow initiation and problem solving. Pt appears to have language as well as apraxic characteristics. Skilled ST is required to address these deficits, increase pt's functional independence and reduce caregiver burden.    Intensity: Minumum of 1-2 x/day, 30 to 90 minutes Frequency: 3 to 5 out of 7 days Duration/Length of Stay: 4 weeks Treatment/Interventions: Cognitive remediation/compensation;Cueing hierarchy;Environmental controls;Functional tasks;Internal/external aids;Medication managment;Multimodal communication approach;Speech/Language facilitation;Therapeutic Activities;Dysphagia/aspiration precaution training;Oral motor exercises;Patient/family education   Daily Session  Skilled Therapeutic Interventions: Skilled treatment session focused on cognition goals. SLP  received pt in bed and transferred pt with help of OT. Pt able to flow 1 step directions within transfer. Pt with mouth movements and gestures to attempt to communicate something in his room before we  left. First attempt that pt has appeared to form words with mouth.  Pt ineffective in conveying message. PMSV placed for ~ 60 minutes without change in vitals.Pt's salvia appeared to have tinged color and pt answered "yes' when asked if his mouth tasted different. Just on observation, it appears that a screw on bottom jaw is irritating soft tissue. Nursing and PA aware. Pt able to select music of his choice and then jokingly change music to one disliked by SLP. Pt able to achieve and maintain labial seal for ~ 1 minute 30 seconds x 3 attempts.  Music was paused with lips parted to increase sensory feedback and awareness. Pt required written model of his name before he was able to write it. Pt with obvious motor planning difficulty with forming letters and his expressions conveyed frustration in motor difficulties. Pt able to independently identify requested piece of money and he was able to demonstrate simple money counting with Mod A multimodal cues. Pt with great improvement with each session. Pt was returned to room, tranfered to bed with nursing help and left in care of nursing staff. Continue per current plan of care.     Function:   Eating Eating Eating activity did not occur: Safety/medical concerns   Eating Assist Level: Helper performs IV, parenteral or tube feed           Cognition Comprehension Comprehension assist level: Understands basic 75 - 89% of the time/ requires cueing 10 - 24% of the time  Expression Expression assistive device: Talk trach valve Expression assist level: Expresses basis less than 25% of the time/requires cueing >75% of the time.  Social Interaction Social Interaction assist level: Interacts appropriately 50 - 74% of the time - May be physically or verbally inappropriate.  Problem Solving Problem solving assist level: Solves basic 50 - 74% of the time/requires cueing 25 - 49% of the time  Memory Memory assist level: Recognizes or recalls 75 - 89% of the  time/requires cueing 10 - 24% of the time   General    Pain    Therapy/Group: Individual Therapy  Virginie Josten 02/27/2017, 3:14 PM

## 2017-02-28 ENCOUNTER — Inpatient Hospital Stay (HOSPITAL_COMMUNITY): Payer: Medicaid Other | Admitting: Speech Pathology

## 2017-02-28 ENCOUNTER — Inpatient Hospital Stay (HOSPITAL_COMMUNITY): Payer: Self-pay | Admitting: Occupational Therapy

## 2017-02-28 LAB — GLUCOSE, CAPILLARY
GLUCOSE-CAPILLARY: 119 mg/dL — AB (ref 65–99)
GLUCOSE-CAPILLARY: 124 mg/dL — AB (ref 65–99)
Glucose-Capillary: 105 mg/dL — ABNORMAL HIGH (ref 65–99)
Glucose-Capillary: 125 mg/dL — ABNORMAL HIGH (ref 65–99)
Glucose-Capillary: 97 mg/dL (ref 65–99)

## 2017-02-28 NOTE — Plan of Care (Signed)
Problem: RH BLADDER ELIMINATION Goal: RH STG MANAGE BLADDER WITH ASSISTANCE STG Manage Bladder With Mod Assistance   Outcome: Not Progressing Max assist at this time
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
## 2017-02-28 NOTE — Progress Notes (Signed)
Occupational Therapy Session Note  Patient Details  Name: Arthur Beasley MRN: [id number] Date of Birth: 1980-12-24  Today's Date: 02/28/2017 OT Individual Time: 1102-1117 OT Individual Time Calculation (min): 60 min    Short Term Goals: Week 1:  OT Short Term Goal 1 (Week 1): Pt will be able to sit at EOB with min A while using LUE to wash RUE. OT Short Term Goal 1 - Progress (Week 1): Met OT Short Term Goal 2 (Week 1): Pt will be able to stand with mod A of one person and use his LUE to pull his pants over his hips at least 50% of the way. OT Short Term Goal 2 - Progress (Week 1): Met OT Short Term Goal 3 (Week 1): Pt will transfer to a BSC with mod A of one person. OT Short Term Goal 3 - Progress (Week 1): Met OT Short Term Goal 4 (Week 1): Pt will use his L hand to self cleanse after toileting with min A. OT Short Term Goal 4 - Progress (Week 1): Progressing toward goal OT Short Term Goal 5 (Week 1): Pt will self range his RUE with min A. OT Short Term Goal 5 - Progress (Week 1): Progressing toward goal  Skilled Therapeutic Interventions/Progress Updates: 1:1 focus on self care retraining at shower level. Pt declined wearing the PMSV in session. Pt also didn't want to wear the helmet and difficulty grasping why he has to wear the helmet- continuing to shake his head no. Focus on forced use of right UE in familiar tasks and visual attention to the right. Pt able to engage right Ue with mod A with max cues. Pt performed all sit to stands with min A. Pt continued to have increased oral secretions requiring suction or constant wiping with a cloth. Stand pivot transfers with mod A with max cues for attention to right side of his body.  Transitioned to the gym and performed 5 min on the Nustep with mod A for activation of right Le and again mod A for right body awareness. Returned to supine in bed at end of session.  Vitals all WFL on trach collar     Therapy Documentation Precautions:   Precautions Precautions: Fall Precaution Comments: left flap, no bone graft; Helmet when OOB; R lean in sitting without LUE support Restrictions Weight Bearing Restrictions: No Pain:no c/o   ADL: ADL ADL Comments: refer to functional navigator  See Function Navigator for Current Functional Status.   Therapy/Group: Individual Therapy  [name] [facility] 02/28/2017, 4:07 PM

## 2017-02-28 NOTE — Progress Notes (Signed)
Speech Language Pathology Daily Session Note  Patient Details  Name: Arthur Beasley MRN: [id number] Date of Birth: 1981/09/06  Today's Date: 02/28/2017 SLP Individual Time: 2800-3491 SLP Individual Time Calculation (min): 45 min  Short Term Goals: Week 2: SLP Short Term Goal 1 (Week 2): Pt will use multimodal communication to communicate wants and needs in 50% of opportunities with Mod A multimodal cues.  SLP Short Term Goal 2 (Week 2): Patient will tolerate PMSV with full supervision with all vitals remaining [facility] SLP Short Term Goal 3 (Week 2): Patient will initiate a swallow with saliva on command in 25% of opportunities and Max A multimodal cues. SLP Short Term Goal 4 (Week 2): Pt will hum with SLP model and Max multimodal cues.  SLP Short Term Goal 5 (Week 2): Pt will achieve and maintain labial seal for 2 minutes with Mod A multimodal cues.  SLP Short Term Goal 6 (Week 2): Pt will complete basic, familiar problem solving tasks related to ADLs with Min A cues.   Skilled Therapeutic Interventions: Skilled treatment session focused on cognition goals. SLP facilitated session by providing Mod A faded to Min A cues for completion of basic, familiar problem solving within pt's room. Music offered and pt with notable increased mouthing to words of songs. No recognizable words formed or voicing heard. Pt left in bed, bed alarm on, PMSV off and all needs within reach. Continue per current plan of care.      Function:  Cognition Comprehension Comprehension assist level: Understands basic 75 - 89% of the time/ requires cueing 10 - 24% of the time  Expression Expression assistive device: Talk trach valve Expression assist level: Expresses basis less than 25% of the time/requires cueing >75% of the time.  Social Interaction Social Interaction assist level: Interacts appropriately 50 - 74% of the time - May be physically or verbally inappropriate.  Problem Solving Problem solving assist level:  Solves basic 50 - 74% of the time/requires cueing 25 - 49% of the time  Memory Memory assist level: Recognizes or recalls 75 - 89% of the time/requires cueing 10 - 24% of the time    Pain    Therapy/Group: Individual Therapy   Arthur Beasley B. Rutherford Nail, M.S., CCC-SLP Speech-Language Pathologist   Tayshun Gappa 02/28/2017, 9:53 AM

## 2017-03-01 ENCOUNTER — Inpatient Hospital Stay (HOSPITAL_COMMUNITY): Payer: Self-pay | Admitting: Occupational Therapy

## 2017-03-01 LAB — GLUCOSE, CAPILLARY
GLUCOSE-CAPILLARY: 103 mg/dL — AB (ref 65–99)
GLUCOSE-CAPILLARY: 119 mg/dL — AB (ref 65–99)
Glucose-Capillary: 106 mg/dL — ABNORMAL HIGH (ref 65–99)
Glucose-Capillary: 116 mg/dL — ABNORMAL HIGH (ref 65–99)
Glucose-Capillary: 87 mg/dL (ref 65–99)

## 2017-03-01 NOTE — Plan of Care (Signed)
Problem: RH BOWEL ELIMINATION Goal: RH STG MANAGE BOWEL WITH ASSISTANCE STG Manage Bowel with mod Assistance.   Outcome: Not Progressing Total assist

## 2017-03-01 NOTE — Progress Notes (Signed)
Multiple children visiting patient. Asked parent to make sure children did not continue to play with water and wet floor for safety. Patient's visitor verbalized understanding. Caution sign placed on door.

## 2017-03-01 NOTE — Progress Notes (Signed)
Arthur Beasley called in regards to security risk to patient. Per Sister the girlfriend is ok to come with the children to see patient.

## 2017-03-01 NOTE — Progress Notes (Signed)
Occupational Therapy Session Note  Patient Details  Name: Arthur Beasley MRN: [id number] Date of Birth: [birth year]  Today's Date: 03/01/2017 OT Individual Time: 1258-1330 OT Individual Time Calculation (min): 32 min    Short Term Goals: Week 2:  OT Short Term Goal 1 (Week 2): Pt will self range his RUE with min A. OT Short Term Goal 2 (Week 2): Pt will use his L hand to self cleanse after toileting with min A. OT Short Term Goal 3 (Week 2): Pt will perform will perform bathing tasks with mod a with sit<>stand from w/c at sink OT Short Term Goal 4 (Week 2): Pt will thread BLE into pants with mod A while seated in w/c. OT Short Term Goal 5 (Week 2): Pt will don pullover shirt with mod A while seated in w/c      Skilled Therapeutic Interventions/Progress Updates: Pt was lying in bed with RN at time of arrival. Girlfriend and children were present. Pt nodding yes to get out of bed for tx. Allowed therapist to don helmet. Supine<sit with Min A and extra time, SPT to w/c without AD completed with Mod A for weight shift and cues for technique/safety. Once in w/c had pt engage in towel slides with R UE focusing on shoulder protraction/retraction and trunk flexion for NMR. Suctioning completed by therapist PRN. Afterwards pt completed transfer back to bed. He was left with all needs and hemiplegic side protected at time of departure. Family also present.      Therapy Documentation Precautions:  Precautions Precautions: Fall Precaution Comments: left flap, no bone graft; Helmet when OOB; R lean in sitting without LUE support Restrictions Weight Bearing Restrictions: No General:   Vital Signs: Therapy Vitals Pulse Rate: 82 Resp: 18 Patient Position (if appropriate): Lying Oxygen Therapy SpO2: 100 % O2 Device: Tracheostomy Collar O2 Flow Rate (L/min): 6 L/min FiO2 (%): 21 % Pain: No c/o or expressions of pain during tx   ADL: ADL ADL Comments: refer to functional navigator     See Function Navigator for Current Functional Status.   Therapy/Group: Individual Therapy  Indianna [name] 03/01/2017, 4:13 PM
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
## 2017-03-02 ENCOUNTER — Inpatient Hospital Stay (HOSPITAL_COMMUNITY): Payer: Self-pay | Admitting: Occupational Therapy

## 2017-03-02 ENCOUNTER — Inpatient Hospital Stay (HOSPITAL_COMMUNITY): Payer: Medicaid Other | Admitting: Speech Pathology

## 2017-03-02 ENCOUNTER — Inpatient Hospital Stay (HOSPITAL_COMMUNITY): Payer: Self-pay | Admitting: Physical Therapy

## 2017-03-02 LAB — CBC
HCT: 30 % — ABNORMAL LOW (ref 39.0–52.0)
HEMOGLOBIN: 9.7 g/dL — AB (ref 13.0–17.0)
MCH: 28.2 pg (ref 26.0–34.0)
MCHC: 32.3 g/dL (ref 30.0–36.0)
MCV: 87.2 fL (ref 78.0–100.0)
Platelets: 297 10*3/uL (ref 150–400)
RBC: 3.44 MIL/uL — ABNORMAL LOW (ref 4.22–5.81)
RDW: 14 % (ref 11.5–15.5)
WBC: 5.3 10*3/uL (ref 4.0–10.5)

## 2017-03-02 LAB — GLUCOSE, CAPILLARY
GLUCOSE-CAPILLARY: 103 mg/dL — AB (ref 65–99)
GLUCOSE-CAPILLARY: 114 mg/dL — AB (ref 65–99)
Glucose-Capillary: 107 mg/dL — ABNORMAL HIGH (ref 65–99)
Glucose-Capillary: 110 mg/dL — ABNORMAL HIGH (ref 65–99)
Glucose-Capillary: 123 mg/dL — ABNORMAL HIGH (ref 65–99)
Glucose-Capillary: 87 mg/dL (ref 65–99)

## 2017-03-02 NOTE — Progress Notes (Signed)
Physical Therapy Session Note  Patient Details  Name: Arthur Beasley MRN: [id number] Date of Birth: 01-21-1981  Today's Date: 03/02/2017 PT Individual Time: 1000-1100 and 1300-1330 PT Individual Time Calculation (min): 60 min and 30 min (total 90 min)   Short Term Goals: Week 2:  PT Short Term Goal 1 (Week 2): Pt will perform bed mobility minA PT Short Term Goal 2 (Week 2): Pt will perform transfers minA w/c <>bed PT Short Term Goal 3 (Week 2): Pt will ambulate >75' modA and LRAD PT Short Term Goal 4 (Week 2): Pt will propel w/c 150' with L hemi technique and S PT Short Term Goal 5 (Week 2): Pt will initiate stair training  Skilled Therapeutic Interventions/Progress Updates: Tx 1: Pt received supine in bed with RN present administering tube feed; denies pain and agreeable to treatment. Pt declines use of PMSV during session despite therapist encouragement to trial. Supine>sit with minA and improving trunk control and balance, however LOB in sitting to R side requiring minA to recover. Stand pivot transfer x4 during session modA and cues for technique. Standing balance LLE on 3" step for RLE weight bearing. Attempted to cue pt in lifting LLE off step to facilitate R weight bearing; pt repeatedly performs step ups with LLE despite max multimodal cueing. On 3" and 6" stairs, performed RLE step ups with modA overall, tactile cueing for quad/glute activation. W/c propulsion with L hemi technique 2x150' with S and min cues for navigation due to inattention to R side. Returned to bed modA stand pivot. Sit >supine minA. Remained supine in bed with alarm intact and all needs in reach.   Tx 2: Pt received supine in bed, denies pain and agreeable to treatment. Pt indicating urgency to use restroom. Stand pivot transfer bed>w/c and w/c <>toilet with modA and cues to reduce impulsivity. Pt incontinent by the time he reached toilet; totalA for changing brief and managing clothing. Gait with RW and +2A, ace  wrap dorsiflexion assist RLE, and assist needed for R foot clearance, stance control, trunk control d/t R lateral lean, and management of RW, however noticeable improvement from first trial to second trial. Returned to bed modA. Remained supine in bed with alarm intact and all needs in reach.      Therapy Documentation Precautions:  Precautions Precautions: Fall Precaution Comments: left flap, no bone graft; Helmet when OOB; R lean in sitting without LUE support Restrictions Weight Bearing Restrictions: (P) No   See Function Navigator for Current Functional Status.   Therapy/Group: Individual Therapy  Luberta Mutter 03/02/2017, 11:51 AM

## 2017-03-02 NOTE — Progress Notes (Signed)
Occupational Therapy Session Note  Patient Details  Name: Arthur Beasley MRN: [id number] Date of Birth: [birth year]  Today's Date: 03/02/2017 OT Individual Time: 1751-0258 OT Individual Time Calculation (min): 66 min    Short Term Goals: Week 1:  OT Short Term Goal 1 (Week 1): Pt will be able to sit at EOB with min A while using LUE to wash RUE. OT Short Term Goal 1 - Progress (Week 1): Met OT Short Term Goal 2 (Week 1): Pt will be able to stand with mod A of one person and use his LUE to pull his pants over his hips at least 50% of the way. OT Short Term Goal 2 - Progress (Week 1): Met OT Short Term Goal 3 (Week 1): Pt will transfer to a BSC with mod A of one person. OT Short Term Goal 3 - Progress (Week 1): Met OT Short Term Goal 4 (Week 1): Pt will use his L hand to self cleanse after toileting with min A. OT Short Term Goal 4 - Progress (Week 1): Progressing toward goal OT Short Term Goal 5 (Week 1): Pt will self range his RUE with min A. OT Short Term Goal 5 - Progress (Week 1): Progressing toward goal Week 2:  OT Short Term Goal 1 (Week 2): Pt will self range his RUE with min A. OT Short Term Goal 2 (Week 2): Pt will use his L hand to self cleanse after toileting with min A. OT Short Term Goal 3 (Week 2): Pt will perform will perform bathing tasks with mod a with sit<>stand from w/c at sink OT Short Term Goal 4 (Week 2): Pt will thread BLE into pants with mod A while seated in w/c. OT Short Term Goal 5 (Week 2): Pt will don pullover shirt with mod A while seated in w/c      Skilled Therapeutic Interventions/Progress Updates: Pt was lying in bed at time of arrival with RN present, agreeable to shower with understanding that he would have to wear PMSV and helmet. OT collected necessary items including trach guard and supplies to cover PEG. Pt transferred from EOB<w/c with Mod A and helmet donned. PMSV ejected after a minute of being applied. Pt then shaking his head no and pushed  valve away when OT tried to reapply. Education provided on needing to wear valve during shower with pt still refusing to wear. He was agreeable to complete ADLs w/c level at sink. Cues provided for right inattention, attention to task, and sequencing during bathing. Mod A sit<stand with R UE weightbearing on sink. Assist for suctioning throughout due to secretions, pt often becoming distracted by secretions and perseverating on washing chest. Pt able to wash feet in figure 4 position, able to don right sock. At end of tx pt was returned to bed and repositioned to protect hemiplegic side. Pt was left with all needs and bed alarm activated at time of departure.   Vitals WFL during session via trach collar.      Therapy Documentation Precautions:  Precautions Precautions: Fall Precaution Comments: left flap, no bone graft; Helmet when OOB; R lean in sitting without LUE support Restrictions Weight Bearing Restrictions: No General:   Vital Signs: Therapy Vitals Pulse Rate: 96 Resp: 16 Oxygen Therapy SpO2: 98 % O2 Device: Tracheostomy Collar O2 Flow Rate (L/min): 6 L/min FiO2 (%): 21 % Pain: Pt medicated prior to tx  Pain Assessment Pain Score: 5  ADL: ADL ADL Comments: refer to functional navigator  See Function Navigator for Current Functional Status.   Therapy/Group: Individual Therapy  Dreyah Montrose A Micalah Cabezas 03/02/2017, 12:20 PM
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
## 2017-03-02 NOTE — Progress Notes (Signed)
Speech Language Pathology Daily Session Note  Patient Details  Name: Arthur Beasley MRN: [id number] Date of Birth: [birth year]  Today's Date: 03/02/2017 SLP Individual Time: 1400-1500 SLP Individual Time Calculation (min): 60 min  Short Term Goals: Week 2: SLP Short Term Goal 1 (Week 2): Pt will use multimodal communication to communicate wants and needs in 50% of opportunities with Mod A multimodal cues.  SLP Short Term Goal 2 (Week 2): Patient will tolerate PMSV with full supervision with all vitals remaining [facility] SLP Short Term Goal 3 (Week 2): Patient will initiate a swallow with saliva on command in 25% of opportunities and Max A multimodal cues. SLP Short Term Goal 4 (Week 2): Pt will hum with SLP model and Max multimodal cues.  SLP Short Term Goal 5 (Week 2): Pt will achieve and maintain labial seal for 2 minutes with Mod A multimodal cues.  SLP Short Term Goal 6 (Week 2): Pt will complete basic, familiar problem solving tasks related to ADLs with Min A cues.   Skilled Therapeutic Interventions: Skilled treatment session focused on cognition goals. SLP placed PMSV on trach when entering room. Pt able to transfer from bed to chair with following 1 step directions, mildly impulsive. Pt tolerate PMSV for ~ 60 minutes. Education provided to pt that PMSV was recommended to be worn during all therapies. When discussing humming or talking, pt pointed to trach to indicate that he had trach and was not able to speak. Education provided that having trach didn't prevent his voice from "turning on." Pt with increased attempts after discussion. Pt mouthed at least 50% of song with periodic glottal sounds/min voicing. For writing, pt required first letter of requested word to be printed and then he was able to finish word. Pt with increased frustration with attempting to write, suspect d/t to apraxia. Pt required Min A to Mod A to complete checkers. Pt was let in bed, bed alarm on, side rails up and all  needs within reach. His sister, brother and niece were present. Continue per current plan of care.      Function:    Cognition Comprehension Comprehension assist level: Understands basic 75 - 89% of the time/ requires cueing 10 - 24% of the time  Expression Expression assistive device: Talk trach valve Expression assist level: Expresses basis less than 25% of the time/requires cueing >75% of the time.  Social Interaction Social Interaction assist level: Interacts appropriately 25 - 49% of time - Needs frequent redirection.  Problem Solving Problem solving assist level: Solves basic 50 - 74% of the time/requires cueing 25 - 49% of the time  Memory Memory assist level: Recognizes or recalls 50 - 74% of the time/requires cueing 25 - 49% of the time    Pain    Therapy/Group: Individual Therapy  Niam Nepomuceno B. Rutherford Nail, M.S., CCC-SLP Speech-Language Pathologist  Keisa Blow 03/02/2017, 3:05 PM

## 2017-03-03 ENCOUNTER — Inpatient Hospital Stay (HOSPITAL_COMMUNITY): Payer: Self-pay | Admitting: Occupational Therapy

## 2017-03-03 ENCOUNTER — Inpatient Hospital Stay (HOSPITAL_COMMUNITY): Payer: Medicaid Other | Admitting: Speech Pathology

## 2017-03-03 ENCOUNTER — Inpatient Hospital Stay (HOSPITAL_COMMUNITY): Payer: Self-pay | Admitting: Physical Therapy

## 2017-03-03 ENCOUNTER — Inpatient Hospital Stay (HOSPITAL_COMMUNITY): Payer: Medicaid Other | Admitting: Occupational Therapy

## 2017-03-03 LAB — GLUCOSE, CAPILLARY
GLUCOSE-CAPILLARY: 82 mg/dL (ref 65–99)
GLUCOSE-CAPILLARY: 94 mg/dL (ref 65–99)
GLUCOSE-CAPILLARY: 113 mg/dL — AB (ref 65–99)
GLUCOSE-CAPILLARY: 141 mg/dL — AB (ref 65–99)
Glucose-Capillary: 99 mg/dL (ref 65–99)
Glucose-Capillary: 118 mg/dL — ABNORMAL HIGH (ref 65–99)

## 2017-03-03 MED ORDER — JEVITY 1.5 CAL/FIBER PO LIQD
350.0000 mL | Freq: Four times a day (QID) | ORAL | Status: DC
  Administered 2017-03-03 – 2017-03-10 (×27): 350 mL
  Filled 2017-03-03 (×37): qty 1000

## 2017-03-03 MED ORDER — JEVITY 1.2 CAL PO LIQD
ORAL | Status: AC
  Filled 2017-03-03: qty 237

## 2017-03-03 NOTE — Progress Notes (Signed)
Speech Language Pathology Daily Session Note  Patient Details  Name: Arthur Beasley MRN: [id number] Date of Birth: [birth year]  Today's Date: 03/03/2017 SLP Individual Time: 1500-1530 SLP Individual Time Calculation (min): 30 min  Short Term Goals: Week 2: SLP Short Term Goal 1 (Week 2): Pt will use multimodal communication to communicate wants and needs in 50% of opportunities with Mod A multimodal cues.  SLP Short Term Goal 2 (Week 2): Patient will tolerate PMSV with full supervision with all vitals remaining [facility] SLP Short Term Goal 3 (Week 2): Patient will initiate a swallow with saliva on command in 25% of opportunities and Max A multimodal cues. SLP Short Term Goal 4 (Week 2): Pt will hum with SLP model and Max multimodal cues.  SLP Short Term Goal 5 (Week 2): Pt will achieve and maintain labial seal for 2 minutes with Mod A multimodal cues.  SLP Short Term Goal 6 (Week 2): Pt will complete basic, familiar problem solving tasks related to ADLs with Min A cues.   Skilled Therapeutic Interventions: Skilled treatment session focused on cognition and dysphagia goals. PMSV placed upon arriving to room and education provided to nursing and therapy for pt to wear PMSV during therapies and nursing care. After education, pt was agreeable to wearing with OT and later with PT. SLP facilitated session by providing first written letter of month and day of the week. Pt required Max A multimodal cues to complete writing the word for "April and Tuesday." Suspect d/t to apraxia. Pt able ot select song request in choice of 2 written words but unable to type the song into phone. Pt with attempts of trace phonation around trach during 2 full length songs. Additionally, pt with swallow initiation this session. Pt able to produce swallow for 100% of attempts with own salvia. Single pieces of ice chips given with no change in vitals and no noted increase in wetness. Pt with hyolaryngeal movement/swallow  initiation. Pt was returned to room, left upright in bed, bed alarm on, PMSV off, trach collar back on and all needs within reach. Continue per current plan of care.      Function:  Eating Eating   Modified Consistency Diet: Yes Eating Assist Level: Helper performs IV, parenteral or tube feed           Cognition Comprehension Comprehension assist level: Understands basic 75 - 89% of the time/ requires cueing 10 - 24% of the time  Expression Expression assistive device: Talk trach valve Expression assist level: Expresses basic 50 - 74% of the time/requires cueing 25 - 49% of the time. Needs to repeat parts of sentences.  Social Interaction Social Interaction assist level: Interacts appropriately 50 - 74% of the time - May be physically or verbally inappropriate.  Problem Solving Problem solving assist level: Solves basic 50 - 74% of the time/requires cueing 25 - 49% of the time  Memory Memory assist level: Recognizes or recalls 50 - 74% of the time/requires cueing 25 - 49% of the time    Pain    Therapy/Group: Individual Therapy   Estreya Clay B. Rutherford Nail, M.S., CCC-SLP Speech-Language Pathologist   Marquinn Meschke 03/03/2017, 3:23 PM

## 2017-03-03 NOTE — Progress Notes (Signed)
Occupational Therapy Session Note  Patient Details  Name: Arthur Beasley MRN: [id number] Date of Birth: 08/10/1981  Today's Date: 03/03/2017 OT Individual Time: 1000-1100 OT Individual Time Calculation (min): 60 min    Short Term Goals: Week 2:  OT Short Term Goal 1 (Week 2): Pt will self range his RUE with min A. OT Short Term Goal 2 (Week 2): Pt will use his L hand to self cleanse after toileting with min A. OT Short Term Goal 3 (Week 2): Pt will perform will perform bathing tasks with mod a with sit<>stand from w/c at sink OT Short Term Goal 4 (Week 2): Pt will thread BLE into pants with mod A while seated in w/c. OT Short Term Goal 5 (Week 2): Pt will don pullover shirt with mod A while seated in w/c   Skilled Therapeutic Interventions/Progress Updates:  1:1 Pt in bed when arrived. Educated pt with SLP on PMSV and its purpose with mirror for visual feedback. Pt allowed the PMSV to be donned for the rest of the session and then even when he coughed it off he made attempts to don it again. Pt demonstrated apraxia trying to put it on his lips instead of his trach. Pt indicated he needed to go to the bathroom and transferred with min A with extra time bed<w/c<toilet<w/c. Pt required total A for toileting due to only wearing a brief and requiring A for hygiene. Engaged in dressing from w/c. Pt again demonstrated dressing apraxia without recognition of error (requiring mod A to correct). Pt required mod A for attention and functional use of left Ue in functional tasks. Pt allowed for oral care to be done. Pt indicated with gestures he wanted  His beard trimmed. Total A for trimming his beard. Transferred back to the bed with min A and left in bed to rest until next appointment.      Therapy Documentation Precautions:  Precautions Precautions: Fall Precaution Comments: left flap, no bone graft; Helmet when OOB; R lean in sitting without LUE support Restrictions Weight Bearing  Restrictions: No Pain: No c/o pain in session  See Function Navigator for Current Functional Status.   Therapy/Group: Individual Therapy  [name] [place] 03/03/2017, 12:25 PM

## 2017-03-03 NOTE — Progress Notes (Signed)
Occupational Therapy Session Note  Patient Details  Name: Arthur Beasley MRN: [id number] Date of Birth: 05-Nov-1981  Today's Date: 03/03/2017 OT Individual Time: 1530-1601 OT Individual Time Calculation (min): 31 min    Short Term Goals: Week 2:  OT Short Term Goal 1 (Week 2): Pt will self range his RUE with min A. OT Short Term Goal 2 (Week 2): Pt will use his L hand to self cleanse after toileting with min A. OT Short Term Goal 3 (Week 2): Pt will perform will perform bathing tasks with mod a with sit<>stand from w/c at sink OT Short Term Goal 4 (Week 2): Pt will thread BLE into pants with mod A while seated in w/c. OT Short Term Goal 5 (Week 2): Pt will don pullover shirt with mod A while seated in w/c   Skilled Therapeutic Interventions/Progress Updates:    Upon entering the room, pt seated in wheelchair and transitioning from PT session easily. Pt propelled wheelchair with hemiplegic technique 60' to day room with supervision for safety. Pt standing from wheelchair with min A and R UE placed into weight bearing on table top. Pt engaged in card game with min A for balance and standing 6 minutes and 5 minutes respectively. While standing, pt coughing and inner cannula completely coming out of trach. RN notified and changed cannula immediately and pt in no distress. Pt placing PMSV onto trach but refusing to voice when asked questions and only shaking head or gesturing. Pt returning to room in same manner as above. Stand pivot transfer with mod A for lifting and steady assistance for sit >supine. Bed rails up, bed alarm activated, and call bell within reach upon exiting the room.   Therapy Documentation Precautions:  Precautions Precautions: Fall Precaution Comments: left flap, no bone graft; Helmet when OOB; R lean in sitting without LUE support Restrictions Weight Bearing Restrictions: No General:   Vital Signs: Therapy Vitals Temp: 98.2 F (36.8 C) Temp Source:  Axillary Pulse Rate: (!) 105 Resp: 17 BP: 117/82 Patient Position (if appropriate): Sitting Oxygen Therapy SpO2: 95 % O2 Device: Not Delivered O2 Flow Rate (L/min): 6 L/min FiO2 (%): 21 % Pain:   ADL: ADL ADL Comments: refer to functional navigator Exercises:   Other Treatments:    See Function Navigator for Current Functional Status.   Therapy/Group: Individual Therapy  Gypsy Decant 03/03/2017, 4:15 PM

## 2017-03-03 NOTE — Progress Notes (Signed)
Downsized trach to 6.0 CFS.  No difficulty.  Small amount of bleeding.  ETCO2 confirms placement, Bilat BS equal, sat 100%.  Changed to new ties, dressing and reattached PMV to new trach.  New obturator at head of bed.  Tolerated well.

## 2017-03-03 NOTE — Progress Notes (Signed)
Physical Therapy Session Note  Patient Details  Name: MOSHE WENGER MRN: [id number] Date of Birth: August 01, 1981  Today's Date: 03/03/2017 PT Individual Time: 1430-1530 PT Individual Time Calculation (min): 60 min   Short Term Goals: Week 2:  PT Short Term Goal 1 (Week 2): Pt will perform bed mobility minA PT Short Term Goal 2 (Week 2): Pt will perform transfers minA w/c <>bed PT Short Term Goal 3 (Week 2): Pt will ambulate >75' modA and LRAD PT Short Term Goal 4 (Week 2): Pt will propel w/c 150' with L hemi technique and S PT Short Term Goal 5 (Week 2): Pt will initiate stair training  Skilled Therapeutic Interventions/Progress Updates: Pt received seated in w/c with handoff from RN/NT; no evidence of pain and agreeable to treatment. PMSV worn throughout session with HR increased to 127bpm with activity. W/c propulsion to/from gym with L hemi techniue and S with min cues for navigation. Gait with RW, R hand splint and variable min>maxA d/t R lateral lean, decreased R stance control and poor midline awareness. Improved midline with cues to stop ambulating, distribute weight evenly between feet, then continue ambulating until drift occurred again. Gait trials 100-150' per trial. Stand pivot transfer w/c <>mat table modA. Standing balance with LLE on 6" step for facilitation of R weight shift and weight bearing, while engaged in ball toss with LUE. Required modA and repetitive multimodal cueing for attention to RLE stance control. One additional gait trial x75' with modA overall; improved midline orientation, however maxA when turning due to impulsivity and decreased R attention. Seated nustep BLE x5 min with mod/maxA to facilitate RLE extension. Returned to room w/c propulsion as above; handoff to OT for next session.      Therapy Documentation Precautions:  Precautions Precautions: Fall Precaution Comments: left flap, no bone graft; Helmet when OOB; R lean in sitting without LUE  support Restrictions Weight Bearing Restrictions: No   See Function Navigator for Current Functional Status.   Therapy/Group: Individual Therapy  Luberta Mutter 03/03/2017, 3:30 PM
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
## 2017-03-04 ENCOUNTER — Inpatient Hospital Stay (HOSPITAL_COMMUNITY): Payer: Self-pay | Admitting: Physical Therapy

## 2017-03-04 ENCOUNTER — Inpatient Hospital Stay (HOSPITAL_COMMUNITY): Payer: Self-pay | Admitting: Occupational Therapy

## 2017-03-04 ENCOUNTER — Inpatient Hospital Stay (HOSPITAL_COMMUNITY): Payer: Medicaid Other | Admitting: Speech Pathology

## 2017-03-04 LAB — GLUCOSE, CAPILLARY
GLUCOSE-CAPILLARY: 95 mg/dL (ref 65–99)
GLUCOSE-CAPILLARY: 110 mg/dL — AB (ref 65–99)
Glucose-Capillary: 113 mg/dL — ABNORMAL HIGH (ref 65–99)
Glucose-Capillary: 141 mg/dL — ABNORMAL HIGH (ref 65–99)

## 2017-03-04 NOTE — Progress Notes (Signed)
Physical Therapy Session Note  Patient Details  Name: Arthur Beasley MRN: [id number] Date of Birth: Jan 12, 1981  Today's Date: 03/04/2017 PT Individual Time: 1415-1530 PT Individual Time Calculation (min): 75 min   Short Term Goals: Week 2:  PT Short Term Goal 1 (Week 2): Pt will perform bed mobility minA PT Short Term Goal 2 (Week 2): Pt will perform transfers minA w/c <>bed PT Short Term Goal 3 (Week 2): Pt will ambulate >75' modA and LRAD PT Short Term Goal 4 (Week 2): Pt will propel w/c 150' with L hemi technique and S PT Short Term Goal 5 (Week 2): Pt will initiate stair training  Skilled Therapeutic Interventions/Progress Updates: Pt received seated in w/c with handoff from RN after tube feeding; missed 15 min PT. Gait to gym with RW and min/modA with cueing for RLE management during turns, and stance control at R glute. Stairs ascent/descent 3" stairs with L handrail x2 trials; modA reduced to minA by second trial. Pt self-selected RLE lead ascent for strengthening and NMR, continued RLE descent d/t eccentric strength deficits. Ascent/descent 6" stairs x1 trial, L handrail and minA overall. Gait training x3 trials, with ace wrap dorsiflexion assist, with no device, and with PLS AFO. Significantly improved gait mechanics, foot clearance and safety with AFO. Educated pt and family on purpose of AFO and plan to assess need prior to d/c to get pt brace if needed. Dynamic gait in hallway side stepping R/L for RLE coordination and NMR, weight shifting and reduced R lean. Standing balance with focus on weight shifting on biodex catch game; pt perseverative on LUE on handle despite cueing to reduce use of UE. Stand pivot transfer w/c <>nustep minA. Performed BUE/BLE nustep, RUE/RLE splints to maintain positioning. Encouraged pt to ambulate back to room, pt impulsively sits in w/c and declines ambulation back to house. Returned to bed minA stand pivot. MinA sit >supine for RLE management. Remained  supine in bed with family present, alarm intact and all needs in reach.      Therapy Documentation Precautions:  Precautions Precautions: Fall Precaution Comments: left flap, no bone graft; Helmet when OOB; R lean in sitting without LUE support Restrictions Weight Bearing Restrictions: No General: PT Amount of Missed Time (min): 15 Minutes PT Missed Treatment Reason: Nursing care   See Function Navigator for Current Functional Status.   Therapy/Group: Individual Therapy  Luberta Mutter 03/04/2017, 3:40 PM

## 2017-03-04 NOTE — Progress Notes (Signed)
Occupational Therapy Session Note  Patient Details  Name: Arthur Beasley MRN: [id number] Date of Birth: 1981-09-24  Today's Date: 03/04/2017 OT Individual Time: 1000-1100 OT Individual Time Calculation (min): 60 min    Short Term Goals: Week 1:  OT Short Term Goal 1 (Week 1): Pt will be able to sit at EOB with min A while using LUE to wash RUE. OT Short Term Goal 1 - Progress (Week 1): Met OT Short Term Goal 2 (Week 1): Pt will be able to stand with mod A of one person and use his LUE to pull his pants over his hips at least 50% of the way. OT Short Term Goal 2 - Progress (Week 1): Met OT Short Term Goal 3 (Week 1): Pt will transfer to a BSC with mod A of one person. OT Short Term Goal 3 - Progress (Week 1): Met OT Short Term Goal 4 (Week 1): Pt will use his L hand to self cleanse after toileting with min A. OT Short Term Goal 4 - Progress (Week 1): Progressing toward goal OT Short Term Goal 5 (Week 1): Pt will self range his RUE with min A. OT Short Term Goal 5 - Progress (Week 1): Progressing toward goal  Skilled Therapeutic Interventions/Progress Updates: 1:1 self care retraining at shower level. Pt tolerated the PMSV donned with trach cover and showered sitting on the tub bench (chest down). Pt able to perform stand step transfers with min A with mod cues for sequencing.  Pt required max A for attention to the right body and field. Pt required max A to use right Ue. Pt ambulated out of the bathroom with mod A with therapist positioned on the right. Pt sat EOB to dress. Pt with increased success donning shirt; only requiring min A for sequencing with threading right sleeve. Pt able to pull on socks if they first are threaded on his toes.   Pt ambulated from room to elevators ~50 feet with mod A HHA with A to weight shift to the left to clear right foot. Pt returned to bed to rest at end of shower.      Therapy Documentation Precautions:  Precautions Precautions: Fall Precaution  Comments: left flap, no bone graft; Helmet when OOB; R lean in sitting without LUE support Restrictions Weight Bearing Restrictions: No Pain:  no c/o pain ADL: ADL ADL Comments: refer to functional navigator  See Function Navigator for Current Functional Status.   Therapy/Group: Individual Therapy  [name] [facility] 03/04/2017, 3:36 PM
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
## 2017-03-04 NOTE — Progress Notes (Signed)
Speech Language Pathology Daily Session Note  Patient Details  Name: Arthur Beasley MRN: [id number] Date of Birth: 10-03-1981  Today's Date: 03/04/2017 SLP Individual Time: 1300-1400 SLP Individual Time Calculation (min): 60 min  Short Term Goals: Week 2: SLP Short Term Goal 1 (Week 2): Pt will use multimodal communication to communicate wants and needs in 50% of opportunities with Mod A multimodal cues.  SLP Short Term Goal 2 (Week 2): Patient will tolerate PMSV with full supervision with all vitals remaining [facility] SLP Short Term Goal 3 (Week 2): Patient will initiate a swallow with saliva on command in 25% of opportunities and Max A multimodal cues. SLP Short Term Goal 4 (Week 2): Pt will hum with SLP model and Max multimodal cues.  SLP Short Term Goal 5 (Week 2): Pt will achieve and maintain labial seal for 2 minutes with Mod A multimodal cues.  SLP Short Term Goal 6 (Week 2): Pt will complete basic, familiar problem solving tasks related to ADLs with Min A cues.   Skilled Therapeutic Interventions: Skilled treatment session focused on communication and dysphagia goals. Pt downsized to #6 cuffless trach on evening of 03/04/17. Pt in bed when SLP entered room. Pt donned PMSV independently and was supervision when transferring from bed to wheelchair. Pt tolerated PMSV for ~ 60 minutes within any decline in vitals. Pt able to manage his salvia with Min A to supervision cues. Pt produced phonation in response to questions and when singing along with song. 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 Pt's voice is hoarse with low vocal intensity. Pt attempted to say ABCs but produced rote aphasic phrase for each letter with visual letters present. Pt able to write his name without visual word and copy correct choice in field of 2 written words for orientation information. Pt hummed voluntarily during writing. Pt consumed 10 small pieces of ice  without any overt s/s of aspiration. Pt attempted to produce voicing after each trial but occasionally has difficulty phonating on demand. Pt with great progress. Pt handed off to PT. Continue per current plan of care.      Function:  Eating Eating   Modified Consistency Diet: Yes (With SLP only) Eating Assist Level: Helper performs IV, parenteral or tube feed           Cognition Comprehension Comprehension assist level: Understands basic 75 - 89% of the time/ requires cueing 10 - 24% of the time  Expression Expression assistive device: Talk trach valve Expression assist level: Expresses basic 50 - 74% of the time/requires cueing 25 - 49% of the time. Needs to repeat parts of sentences.  Social Interaction Social Interaction assist level: Interacts appropriately 90% of the time - Needs monitoring or encouragement for participation or interaction.  Problem Solving Problem solving assist level: Solves basic 50 - 74% of the time/requires cueing 25 - 49% of the time  Memory Memory assist level: Recognizes or recalls 75 - 89% of the time/requires cueing 10 - 24% of the time    Pain    Therapy/Group: Individual Therapy   Jeweldean Drohan B. Rutherford Nail, M.S., CCC-SLP Speech-Language Pathologist   Minola Guin 03/04/2017, 4:01 PM

## 2017-03-05 ENCOUNTER — Inpatient Hospital Stay (HOSPITAL_COMMUNITY): Payer: Self-pay | Admitting: Speech Pathology

## 2017-03-05 ENCOUNTER — Inpatient Hospital Stay (HOSPITAL_COMMUNITY): Payer: Self-pay

## 2017-03-05 ENCOUNTER — Inpatient Hospital Stay (HOSPITAL_COMMUNITY): Payer: Self-pay | Admitting: Physical Therapy

## 2017-03-05 LAB — BASIC METABOLIC PANEL
ANION GAP: 9 (ref 5–15)
BUN: 12 mg/dL (ref 6–20)
CALCIUM: 9.4 mg/dL (ref 8.9–10.3)
CHLORIDE: 99 mmol/L — AB (ref 101–111)
CO2: 30 mmol/L (ref 22–32)
CREATININE: 0.78 mg/dL (ref 0.61–1.24)
GFR calc Af Amer: >60 mL/min (ref >60)
GFR calc non Af Amer: >60 mL/min (ref >60)
Glucose, Bld: 104 mg/dL — ABNORMAL HIGH (ref 65–99)
Potassium: 4 mmol/L (ref 3.5–5.1)
SODIUM: 138 mmol/L (ref 135–145)

## 2017-03-05 LAB — CBC
HCT: 30.6 % — ABNORMAL LOW (ref 39.0–52.0)
HEMOGLOBIN: 9.9 g/dL — AB (ref 13.0–17.0)
MCH: 28.4 pg (ref 26.0–34.0)
MCHC: 32.4 g/dL (ref 30.0–36.0)
MCV: 87.9 fL (ref 78.0–100.0)
Platelets: 301 10*3/uL (ref 150–400)
RBC: 3.48 MIL/uL — AB (ref 4.22–5.81)
RDW: 14.3 % (ref 11.5–15.5)
WBC: 5.9 10*3/uL (ref 4.0–10.5)

## 2017-03-05 LAB — GLUCOSE, CAPILLARY
GLUCOSE-CAPILLARY: 94 mg/dL (ref 65–99)
Glucose-Capillary: 111 mg/dL — ABNORMAL HIGH (ref 65–99)
Glucose-Capillary: 121 mg/dL — ABNORMAL HIGH (ref 65–99)
Glucose-Capillary: 129 mg/dL — ABNORMAL HIGH (ref 65–99)
Glucose-Capillary: 90 mg/dL (ref 65–99)

## 2017-03-05 MED ORDER — INSULIN ASPART 100 UNIT/ML ~~LOC~~ SOLN
0.0000 [IU] | Freq: Four times a day (QID) | SUBCUTANEOUS | Status: DC
  Administered 2017-03-06 – 2017-03-10 (×5): 1 [IU] via SUBCUTANEOUS

## 2017-03-05 NOTE — Progress Notes (Signed)
Speech Language Pathology Daily Session Note  Patient Details  Name: Arthur Beasley MRN: [id number] Date of Birth: [birth year]  Today's Date: 03/05/2017 SLP Individual Time: 1015-1100 SLP Individual Time Calculation (min): 45 min  Short Term Goals: Week 2: SLP Short Term Goal 1 (Week 2): Pt will use multimodal communication to communicate wants and needs in 50% of opportunities with Mod A multimodal cues.  SLP Short Term Goal 2 (Week 2): Patient will tolerate PMSV with full supervision with all vitals remaining [facility] SLP Short Term Goal 3 (Week 2): Patient will initiate a swallow with saliva on command in 25% of opportunities and Max A multimodal cues. SLP Short Term Goal 4 (Week 2): Pt will hum with SLP model and Max multimodal cues.  SLP Short Term Goal 5 (Week 2): Pt will achieve and maintain labial seal for 2 minutes with Mod A multimodal cues.  SLP Short Term Goal 6 (Week 2): Pt will complete basic, familiar problem solving tasks related to ADLs with Min A cues.   Skilled Therapeutic Interventions: Skilled treatment session focused on dysphagia and cognition goals. SLP facilitated session by providing trials of ice chips and sips of thin liquids via straw. Pt with appearance of timely swallow initiation and no overt s/s of aspiration with 4 oz. Pt with voicing and ~ 50 increased intelligibility and consistent voicing when singing with song. Pt able to tolerate PMSV placement for ~ 45 minutes without decline in vitals. He continues to manage his salvia with occasional supervision cues. Pt able to complete complex checkers game with Mod I. Pt returned to room, transferred to bed, bed alarm on and all needs within reach. Pt's family present. Continue per current plan of care.      Function:  Eating Eating   Modified Consistency Diet: No (Trials with ST only)             Cognition Comprehension Comprehension assist level: Understands basic 90% of the time/cues < 10% of the time   Expression Expression assistive device: Talk trach valve Expression assist level: Expresses basic 50 - 74% of the time/requires cueing 25 - 49% of the time. Needs to repeat parts of sentences.  Social Interaction Social Interaction assist level: Interacts appropriately 90% of the time - Needs monitoring or encouragement for participation or interaction.  Problem Solving Problem solving assist level: Solves basic 75 - 89% of the time/requires cueing 10 - 24% of the time  Memory Memory assist level: Recognizes or recalls 75 - 89% of the time/requires cueing 10 - 24% of the time    Pain    Therapy/Group: Individual Therapy   Arthur Beasley, M.S., CCC-SLP Speech-Language Pathologist   Jubal Rademaker 03/05/2017, 12:55 PM

## 2017-03-05 NOTE — Progress Notes (Signed)
Physical Therapy Session Note  Patient Details  Name: Arthur Beasley MRN: [id number] Date of Birth: 10-16-1981  Today's Date: 03/05/2017 PT Individual Time: 0900-1000 PT Individual Time Calculation (min): 60 min   Short Term Goals: Week 2:  PT Short Term Goal 1 (Week 2): Pt will perform bed mobility minA PT Short Term Goal 2 (Week 2): Pt will perform transfers minA w/c <>bed PT Short Term Goal 3 (Week 2): Pt will ambulate >75' modA and LRAD PT Short Term Goal 4 (Week 2): Pt will propel w/c 150' with L hemi technique and S PT Short Term Goal 5 (Week 2): Pt will initiate stair training  Skilled Therapeutic Interventions/Progress Updates: Pt received supine in bed, denies pain and agreeable to treatment. Supine>sit minA for trunk control. Gait into bathroom with modA d/t impulsivity and no AFO donned d/t urgency. Pt with continent of urine on toilet. Pt indicating he did not want to get dressed until he could shower. Ambulatory transfer to padded shower chair with modA. Pt performed bathing with minA for back, buttocks and L arm. Pt dried self with setupA. Shirt donned minA for RUE threading into shirt, with max verbal cues for technique after pt incorrectly donning shirt with head and L arm first. BLE threaded into pants in sitting with close S for balance, min guard for balance while pt pulled pants up. Required assist to fasten pants. L sock and shoe donned with S, totalA for R side after pt unsuccessfully attempted to don R sock. Gait with modA, R AFO 2x150'; note most difficulty with turning d/t R inattention and pt forgetting to progress RLE. Stairs ascent/descent four 6-inch stairs with L handrail and min/modA; pt self-selected RLE ascent for strengthening/NMR. Gait to return to room as above. Sit >supine with minA for RLE management. Remained supine in bed with RN and family present, alarm intact and all needs in reach.      Therapy Documentation Precautions:  Precautions Precautions:  Fall Precaution Comments: left flap, no bone graft; Helmet when OOB; R lean in sitting without LUE support Restrictions Weight Bearing Restrictions: No   See Function Navigator for Current Functional Status.   Therapy/Group: Individual Therapy  Luberta Mutter 03/05/2017, 11:54 AM
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
## 2017-03-05 NOTE — Progress Notes (Signed)
Occupational Therapy Session Note  Patient Details  Name: Arthur Beasley MRN: [id number] Date of Birth: [birth year]  Today's Date: 03/05/2017 OT Individual Time: 1300-1430 OT Individual Time Calculation (min): 90 min    Short Term Goals: Week 1:  OT Short Term Goal 1 (Week 1): Pt will be able to sit at EOB with min A while using LUE to wash RUE. OT Short Term Goal 1 - Progress (Week 1): Met OT Short Term Goal 2 (Week 1): Pt will be able to stand with mod A of one person and use his LUE to pull his pants over his hips at least 50% of the way. OT Short Term Goal 2 - Progress (Week 1): Met OT Short Term Goal 3 (Week 1): Pt will transfer to a BSC with mod A of one person. OT Short Term Goal 3 - Progress (Week 1): Met OT Short Term Goal 4 (Week 1): Pt will use his L hand to self cleanse after toileting with min A. OT Short Term Goal 4 - Progress (Week 1): Progressing toward goal OT Short Term Goal 5 (Week 1): Pt will self range his RUE with min A. OT Short Term Goal 5 - Progress (Week 1): Progressing toward goal  Skilled Therapeutic Interventions/Progress Updates:    Pt resting in bed upon arrival with family present.  Pt completed shower and dressing earlier in the AM with PT.  Pt propelled w/c to gym and engaged in [place] while seated EOM with RUE weight bearing and supine.  Pt requires max multimodal cues to initiate and isolate movement at shoulder and elbow.  Pt transitioned to engaging in colored peg board tasks requiring mod verbal cues to placing correct peg in appropriate pattern.  Pt propelled w/c to day room and engaged in assembling simple puzzle with max verbal cues.  Pt returned to room and requested use of toilet prior to returning to bed.  Pt performed toilet transfers with min A but required max A for toileting.  Pt doffed pants but required A with hygiene and pulling up pants.  Pt returned to bed and remained in bed with bed alarm activated, O2 sensor in place, and all needs  within reach.  Nursing student present.   Therapy Documentation Precautions:  Precautions Precautions: Fall Precaution Comments: left flap, no bone graft; Helmet when OOB; R lean in sitting without LUE support Restrictions Weight Bearing Restrictions: No   Pain:  Pt denied pain  See Function Navigator for Current Functional Status.   Therapy/Group: Individual Therapy  Leroy Libman 03/05/2017, 2:50 PM

## 2017-03-05 NOTE — Progress Notes (Signed)
Occupational Therapy Weekly Progress Note  Patient Details  Name: Arthur Beasley MRN: [id number] Date of Birth: [birth year]  Beginning of progress report period: February 26, 2017 End of progress report period: March 05, 2017  Patient has met 4 of 5 short term goals.  Pt made steady progress with BADLs during the past week.  Pt continues to exhibit impulsive behaviors and requires max multimodal cues for safety during transitional movements.  Pt requires mod multimodal cues to attend to his R during functional tasks and to locate objects in his R lower quadrant field of vision.  Pt performs sit<>stand with steady A/min A but requires mod A for standing balance with max multimodal cues for weight shifts and safety awareness.  Pt RUE noted with inconsistent tone but pt unable to initiate voluntary movement during functional tasks.   Patient continues to demonstrate the following deficits: muscle weakness, decreased cardiorespiratoy endurance and decreased oxygen support, impaired timing and sequencing, unbalanced muscle activation, motor apraxia, decreased coordination and decreased motor planning, decreased visual acuity and decreased visual perceptual skills, decreased midline orientation, decreased attention to right, right side neglect and decreased motor planning, decreased initiation, decreased attention, decreased awareness, decreased problem solving, decreased safety awareness, decreased memory and delayed processing and decreased sitting balance, decreased standing balance, decreased postural control, hemiplegia, decreased balance strategies and difficulty maintaining precautions and therefore will continue to benefit from skilled OT intervention to enhance overall performance with BADL and Reduce care partner burden.  Patient progressing toward long term goals..  Continue plan of care.  OT Short Term Goals Week 2:  OT Short Term Goal 1 (Week 2): Pt will self range his RUE with min A. OT  Short Term Goal 1 - Progress (Week 2): Met OT Short Term Goal 2 (Week 2): Pt will use his L hand to self cleanse after toileting with min A. OT Short Term Goal 2 - Progress (Week 2): Met OT Short Term Goal 3 (Week 2): Pt will perform will perform bathing tasks with mod a with sit<>stand from w/c at sink OT Short Term Goal 3 - Progress (Week 2): Progressing toward goal OT Short Term Goal 4 (Week 2): Pt will thread BLE into pants with mod A while seated in w/c. OT Short Term Goal 4 - Progress (Week 2): Met OT Short Term Goal 5 (Week 2): Pt will don pullover shirt with mod A while seated in w/c  OT Short Term Goal 5 - Progress (Week 2): Met Week 3:  OT Short Term Goal 1 (Week 3): Pt will perform bathing tasks with mod A sit<>stand from seat. OT Short Term Goal 2 (Week 3): Pt will thread BLE into pants with min A while seated in w/c OT Short Term Goal 3 (Week 3): Pt will thread B socks with min A OT Short Term Goal 4 (Week 3): Pt will perform toileting tasks with steady A      Therapy Documentation Precautions:  Precautions Precautions: Fall Precaution Comments: left flap, no bone graft; Helmet when OOB; R lean in sitting without LUE support Restrictions Weight Bearing Restrictions: No  See Function Navigator for Current Functional Status.   [name] [facility] 03/05/2017, 3:02 PM
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
## 2017-03-06 ENCOUNTER — Inpatient Hospital Stay (HOSPITAL_COMMUNITY): Payer: Medicaid Other | Admitting: Speech Pathology

## 2017-03-06 ENCOUNTER — Inpatient Hospital Stay (HOSPITAL_COMMUNITY): Payer: Self-pay

## 2017-03-06 ENCOUNTER — Inpatient Hospital Stay (HOSPITAL_COMMUNITY): Payer: Self-pay | Admitting: Physical Therapy

## 2017-03-06 LAB — GLUCOSE, CAPILLARY
GLUCOSE-CAPILLARY: 106 mg/dL — AB (ref 65–99)
GLUCOSE-CAPILLARY: 120 mg/dL — AB (ref 65–99)
Glucose-Capillary: 96 mg/dL (ref 65–99)
Glucose-Capillary: 101 mg/dL — ABNORMAL HIGH (ref 65–99)
Glucose-Capillary: 144 mg/dL — ABNORMAL HIGH (ref 65–99)
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
## 2017-03-06 NOTE — Progress Notes (Signed)
Occupational Therapy Note  Patient Details  Name: Arthur Beasley MRN: [id number] Date of Birth: [birth year]  Today's Date: 03/06/2017 OT Missed Time: 51 Minutes Missed Time Reason: Patient unwilling/refused to participate without medical reason  Pt resting in bed upon arrival with RN present.  RN stated that pt's trach had just been downsized.  Pt refused to participate in therapy.  Upon questioning, pt hesitant to do anything after trach downsized. Pt missed 30 mins skilled OT services.    [name] [facility] 03/06/2017, 2:45 PM

## 2017-03-06 NOTE — Progress Notes (Signed)
Physical Therapy Weekly Progress Note  Patient Details  Name: Arthur Beasley MRN: [id number] Date of Birth: 10-Feb-1981  Beginning of progress report period: February 27, 2017 End of progress report period: March 06, 2017  Today's Date: 03/06/2017 PT Individual Time: 1000-1100 PT Individual Time Calculation (min): 60 min   Patient has met 5 of 5 short term goals.  Currently requires S to minA for bed mobility depending on fatigue and motivation. Performs stand pivot transfer with minA d/t RLE strength/coordination deficits, R inattention and impulsivity. Gait performed with no AD, RLE AFO, and min/modA overall d/t R neuromuscular deficits, and R lateral lean with impaired perception of midline, righting reaction impairments. Family present for occasional PT sessions however no formal family training done at this time; will need hands-on education session closer to d/c due to likelihood that pt will remain a high fall risk.   Patient continues to demonstrate the following deficits muscle weakness and muscle paralysis, decreased cardiorespiratoy endurance, impaired timing and sequencing, unbalanced muscle activation, motor apraxia, decreased coordination and decreased motor planning, decreased midline orientation, decreased attention to right, decreased motor planning and ideational apraxia, decreased initiation, decreased attention, decreased awareness, decreased problem solving, decreased safety awareness, decreased memory and delayed processing and decreased standing balance, decreased postural control, hemiplegia and decreased balance strategies and therefore will continue to benefit from skilled PT intervention to increase functional independence with mobility.  Patient progressing toward long term goals..  Continue plan of care.  PT Short Term Goals Week 2:  PT Short Term Goal 1 (Week 2): Pt will perform bed mobility minA PT Short Term Goal 1 - Progress (Week 2): Met PT Short Term Goal 2  (Week 2): Pt will perform transfers minA w/c <>bed PT Short Term Goal 2 - Progress (Week 2): Met PT Short Term Goal 3 (Week 2): Pt will ambulate >75' modA and LRAD PT Short Term Goal 3 - Progress (Week 2): Met PT Short Term Goal 4 (Week 2): Pt will propel w/c 150' with L hemi technique and S PT Short Term Goal 4 - Progress (Week 2): Met PT Short Term Goal 5 (Week 2): Pt will initiate stair training PT Short Term Goal 5 - Progress (Week 2): Met Week 3:  PT Short Term Goal 1 (Week 3): Pt will demonstrate bed mobility modI PT Short Term Goal 2 (Week 3): Pt will demonstrate transfers min guard PT Short Term Goal 3 (Week 3): Pt will ambulate x150' consistent modA PT Short Term Goal 4 (Week 3): Pt will ascend/descend 12 stairs 6-inch height minA  Skilled Therapeutic Interventions/Progress Updates: Tx 1: Pt received supine in bed, denies pain with a thumbs up, and agreeable to treatment. Pt dons B shoes with setupA; requires assist for fastening; pt adamantly declining use of AFO at this time. Supine>sit with S. Gait with no AD, no AFO and mod/maxA with increased R foot drag and R lateral LOB. Pt does demonstrate 1/5 dorsiflexion in RLE, however not sufficient for foot clearance at this time. Standing kinetron with BLE and BUE support for focus on weight shifting, RLE stance control and hip/knee extension. Use of mirror for visual feedback, and tactile cueing at RLE to increase awareness and proprioception. Seated RUE ranger; unable to activate RUE for movement, with trunk compensating heavily requiring therapist to stabilize trunk. Pt demonstrates winging of R scapula. Cued pt in performing scapular retractions with max tactile cueing, and scapular elevation again with max tactile cues. Use of mirror again for visual feedback and  attention to R extremity. Pt demonstrates 2/5 AROM in rhomboids/middle traps, 1/5 upper traps for scapular elevation. Palpable trigger points in rhomboids/mid traps; perform soft  tissue massage and mild trigger point release with pt initially reporting relief of pain. With increased time, pt noted to be tearful however not demonstrating evidence of pain; unable to determine cause of pt becoming emotional, however soft tissue massage ceased at that time. Supine BLE bridging with ball squeeze adduction isometric, and BLE straight leg raise with focus on RLE stability when exercising LLE; 2x15 reps each. Gait to return to room with modA as above. Pt doffs shoes with S and increased time; had to return to supine to get R shoe off. Sit >supine with S. Remained semi-reclined with alarm intact, 4 rails per pt request, and all needs in reach. Humidified air not applied per pt request.   Tx 2: Pt received supine in bed; declining participation following trach downsized. Encouraged pt to participate however continues to shake head no and decline participation. Continue per POC.     Therapy Documentation Precautions:  Precautions Precautions: Fall Precaution Comments: left flap, no bone graft; Helmet when OOB; R lean in sitting without LUE support Restrictions Weight Bearing Restrictions: No General: PT Amount of Missed Time (min): 30 Minutes PT Missed Treatment Reason: Patient unwilling to participate Pain: Pain Assessment Pain Assessment: No/denies pain   See Function Navigator for Current Functional Status.  Therapy/Group: Individual Therapy  Luberta Mutter 03/06/2017, 11:09 AM

## 2017-03-06 NOTE — Plan of Care (Signed)
Problem: RH Bed Mobility Goal: LTG Patient will perform bed mobility with assist (PT) LTG: Patient will perform bed mobility with assistance, with/without cues (PT).  Upgraded d/t progress  Problem: RH Bed to Chair Transfers Goal: LTG Patient will perform bed/chair transfers w/assist (PT) LTG: Patient will perform bed/chair transfers with assistance, with/without cues (PT).  Upgraded d/t progress  Problem: RH Ambulation Goal: LTG Patient will ambulate in home environment (PT) LTG: Patient will ambulate in home environment, # of feet with assistance (PT).  Upgraded d/t progress  Problem: RH Stairs Goal: LTG Patient will ambulate up and down stairs w/assist (PT) LTG: Patient will ambulate up and down # of stairs with assistance (PT)  Upgraded d/t progress

## 2017-03-06 NOTE — Procedures (Signed)
Tracheostomy Change Note  Patient Details:   Name: Arthur Beasley DOB: December 18, 1980 MRN: [id number]    Airway Documentation:     Evaluation  O2 sats: stable throughout Complications: No apparent complications Patient did tolerate procedure well. Bilateral Breath Sounds: Clear, Diminished    Called to patient room due to dislodged trach.  Lurline Idol was laying on patient chest.  Lurline Idol was due for a downsize.  Trach downsized to #4 cuffless per MD order.  Pt tolerated well.  There was slight bleeding after trach inserted.  Positive CO2 color change and equal BBS.  O2 sats stable throughout.  RT will continue to monitor.   Carson Myrtle 03/06/2017, 4:42 PM

## 2017-03-06 NOTE — Progress Notes (Signed)
Speech Language Pathology Weekly Progress and Session Note  Patient Details  Name: Arthur Beasley MRN: [id number] Date of Birth: 1981/05/01  Beginning of progress report period: February 27, 2017 End of progress report period: March 06, 2017  Today's Date: 03/06/2017 SLP Individual Time: 0900-0930 SLP Individual Time Calculation (min): 30 min  Short Term Goals: Week 2: SLP Short Term Goal 1 (Week 2): Pt will use multimodal communication to communicate wants and needs in 50% of opportunities with Mod A multimodal cues.  SLP Short Term Goal 1 - Progress (Week 2): Not met SLP Short Term Goal 2 (Week 2): Patient will tolerate PMSV with full supervision with all vitals remaining [facility] SLP Short Term Goal 2 - Progress (Week 2): Met SLP Short Term Goal 3 (Week 2): Patient will initiate a swallow with saliva on command in 25% of opportunities and Max A multimodal cues. SLP Short Term Goal 3 - Progress (Week 2): Met SLP Short Term Goal 4 (Week 2): Pt will hum with SLP model and Max multimodal cues.  SLP Short Term Goal 4 - Progress (Week 2): Met SLP Short Term Goal 5 (Week 2): Pt will achieve and maintain labial seal for 2 minutes with Mod A multimodal cues.  SLP Short Term Goal 5 - Progress (Week 2): Met SLP Short Term Goal 6 (Week 2): Pt will complete basic, familiar problem solving tasks related to ADLs with Min A cues.  SLP Short Term Goal 6 - Progress (Week 2): Not met    New Short Term Goals: Week 3: SLP Short Term Goal 1 (Week 3): Pt will complete basic, familiar problem solving tasks related to ADLs with Min A cues.  SLP Short Term Goal 2 (Week 3): Pt will imitate bilabial and vowel sounds with verbal and visual model in 75% of opportunities.  SLP Short Term Goal 3 (Week 3): Pt will use multimodal communication to communicate wants and needs in 50% of opportunities with Mod A multimodal cues.  SLP Short Term Goal 4 (Week 3): Pt will follow 2 step simple directions with 75% accuracy and  Mod A cues.  SLP Short Term Goal 5 (Week 3): Pt will consume thin liquids without overt s/s of aspiration to demonstrate readiness for further instrumental study/diet advancement.  SLP Short Term Goal 6 (Week 3): Pt will consume thin liquids without overt s/s of aspiration to demonstrate readiness for further instrumental study/diet advancement.   Weekly Progress Updates: Pt with good progress this reporting period as evidenced by meeting 4 of 6 STGs. Pt with good progress in the areas of PMSV toleration, voicing and swallow initiation. Pt is tolerating trials of thin water, obtaining phonation and is able to wear PMSV during all therapy sessions without decline in vitals. Pt recently downsized to #4 cuffless but he has begun refusing to donn PMSV during some sessions. Pt is not effective in communicating why. As such, pt continues to be aphasic and apraxic in spoken language and motoric movements such as total body movements and with writing. Pt continues to require skilled ST to address these deficits in swallow function, communication and problem solving to increase functional independence and decrease caregiver burden.      Intensity: Minumum of 1-2 x/day, 30 to 90 minutes Frequency: 3 to 5 out of 7 days Duration/Length of Stay: 5/4 Treatment/Interventions: Cognitive remediation/compensation;Cueing hierarchy;Environmental controls;Functional tasks;Internal/external aids;Medication managment;Multimodal communication approach;Speech/Language facilitation;Therapeutic Activities;Dysphagia/aspiration precaution training;Oral motor exercises;Patient/family education   Daily Session  Skilled Therapeutic Interventions: Skilled treatment session focused on cognition  and dysphagia goals. RT present and had removed inner-canula for cleaning. It was soaking, so yes//no questions asked for gestural communication. Pt was >90% accurate. After inner canula replaced, pt consumed 6 oz of thin water via straw  without overt s/s of aspiration. Pt was left upright in bed, bed alarm on and all needs within reach. PT and OT state that pt often refuses to wear PMSV during their sessions. Extensive education provided to pt on need to wear PMSV. Continue current plan of care.       Function:   Eating Eating   Modified Consistency Diet: Yes (With SLP only) Eating Assist Level: Helper performs IV, parenteral or tube feed           Cognition Comprehension Comprehension assist level: Understands basic 75 - 89% of the time/ requires cueing 10 - 24% of the time  Expression Expression assistive device: Talk trach valve Expression assist level: Expresses basic 50 - 74% of the time/requires cueing 25 - 49% of the time. Needs to repeat parts of sentences.  Social Interaction Social Interaction assist level: Interacts appropriately 90% of the time - Needs monitoring or encouragement for participation or interaction.  Problem Solving Problem solving assist level: Solves basic 50 - 74% of the time/requires cueing 25 - 49% of the time  Memory Memory assist level: Recognizes or recalls 75 - 89% of the time/requires cueing 10 - 24% of the time   General    Pain    Therapy/Group: Individual Therapy   Liisa Picone B. Rutherford Nail, M.S., CCC-SLP Speech-Language Pathologist   Atleigh Gruen 03/06/2017, 4:40 PM

## 2017-03-06 NOTE — Progress Notes (Signed)
Social Work Patient ID: Arthur Beasley, male   DOB: Dec 15, 1980, 36 y.o.   MRN: [id number]   Met yesterday afternoon with pt and his sister, [name], to review team conference.  Pt so much more alert and engaged throughout our discussion this week.  They both understand that we continue to aim toward a d/c date of 5/4 and sister still planning for pt to come to her home at d/c.  As she states this, pt quickly looks at her and raises eyebrows.  She explains to him that her home is the best and safest option for him and his care needs.  Pt makes expression of reluctant agreement and nodding "yes".  We discussed follow up services, PCS aide and DME.  No concerns at this time.  Faryal Marxen, LCSW

## 2017-03-06 NOTE — Progress Notes (Signed)
Occupational Therapy Session Note  Patient Details  Name: Arthur Beasley MRN: [id number] Date of Birth: [birth year]  Today's Date: 03/06/2017 OT Individual Time: 0700-0800 OT Individual Time Calculation (min): 60 min    Short Term Goals: Week 3:  OT Short Term Goal 1 (Week 3): Pt will perform bathing tasks with mod A sit<>stand from seat. OT Short Term Goal 2 (Week 3): Pt will thread BLE into pants with min A while seated in w/c OT Short Term Goal 3 (Week 3): Pt will thread B socks with min A OT Short Term Goal 4 (Week 3): Pt will perform toileting tasks with steady A  Skilled Therapeutic Interventions/Progress Updates:    Pt resting in bed upon arrival and agreeable to taking a shower for BADL retraining.  Pt increasingly resistant to assistance offered by therapist throughout bathing and dressing tasks.  Pt prevented therapist from bathing his LUE and became upset with steady A when standing to pull up pants.  Pt was able to don his pants and socks without assistance although he donned his underpants backwards but refused to correct.  Pt attempted to don his shirt (head, LUE, over trunk). Pt was unable to problem solve how to thread his RUE and refused to start over to learn hemi dressing techniques.  Pt required assistance with completing UB dressing tasks.  Pt performed all transfers with steady A and became mildly agitated with steady A offered. Pt declined wearing PMSV today.  Pt returned to bed and remained in bed with all needs within reach and bed alarm activated.   Therapy Documentation Precautions:  Precautions Precautions: Fall Precaution Comments: left flap, no bone graft; Helmet when OOB; R lean in sitting without LUE support Restrictions Weight Bearing Restrictions: No Pain:  Pt denied pain  See Function Navigator for Current Functional Status.   Therapy/Group: Individual Therapy  Leroy Libman 03/06/2017, 8:04 AM

## 2017-03-06 NOTE — Progress Notes (Signed)
RN notified by NT that patients trach was completely pulled out.  Upon entering room, patients trach was noted to be out of stoma still attached by neck ties.  Patient states he was trying to remove the PMSV.  Oxygen saturation 100%.  Called RT.  Patient was to have trach downsized today anyway, therefore RT inserted #4 shiley.  Patient tolerated well.  Some bloody drainage noted after insertion complete.  Oxygen saturation still 100%.  RT advised RN that some bloody drainage is to be expected.  Will continue to monitor.  Brita Romp, RN

## 2017-03-07 ENCOUNTER — Inpatient Hospital Stay (HOSPITAL_COMMUNITY): Payer: Self-pay | Admitting: Occupational Therapy

## 2017-03-07 ENCOUNTER — Inpatient Hospital Stay (HOSPITAL_COMMUNITY): Payer: Self-pay | Admitting: Physical Therapy

## 2017-03-07 LAB — GLUCOSE, CAPILLARY
GLUCOSE-CAPILLARY: 144 mg/dL — AB (ref 65–99)
Glucose-Capillary: 108 mg/dL — ABNORMAL HIGH (ref 65–99)

## 2017-03-07 MED ORDER — PRO-STAT SUGAR FREE PO LIQD
30.0000 mL | ORAL | Status: DC
  Administered 2017-03-08 – 2017-03-12 (×5): 30 mL
  Filled 2017-03-07 (×4): qty 30
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
## 2017-03-07 NOTE — Progress Notes (Signed)
Occupational Therapy Session Note  Patient Details  Name: Arthur Beasley MRN: [id number] Date of Birth: 1981-08-16  Today's Date: 03/07/2017 OT Individual Time: 1300-1345 OT Individual Time Calculation (min): 45 min    Short Term Goals: Week 3:  OT Short Term Goal 1 (Week 3): Pt will perform bathing tasks with mod A sit<>stand from seat. OT Short Term Goal 2 (Week 3): Pt will thread BLE into pants with min A while seated in w/c OT Short Term Goal 3 (Week 3): Pt will thread B socks with min A OT Short Term Goal 4 (Week 3): Pt will perform toileting tasks with steady A  Skilled Therapeutic Interventions/Progress Updates:    OT treatment session focused on functional ambulation and R NMR. Helmet donned in bed, then pt came to sitting EOB with supervision. Pt donned shoes with set-up and Min A to thread Velcro strap. Pt ambulated to and from gym with steady A and intermittent Min A to correct LOB w/ R foot. Bioness activity on level 10 with focus on flexion extension there-ex. Progressed to functional grasp/release with guided cup grasp activity. Pt returned to room at end of session and left semi-reclined in bed with needs met. Bioness worksheet completed and pads left on pt's window for further OT use.   Therapy Documentation Precautions:  Precautions Precautions: Fall Precaution Comments: left flap, no bone graft; Helmet when OOB; R lean in sitting without LUE support Restrictions Weight Bearing Restrictions: No Pain: Pain Assessment Pain Assessment: No/denies pain ADL: ADL ADL Comments: refer to functional navigator  See Function Navigator for Current Functional Status.   Therapy/Group: Individual Therapy  Valma Cava 03/07/2017, 1:46 PM

## 2017-03-07 NOTE — Progress Notes (Signed)
Physical Therapy Session Note  Patient Details  Name: Arthur Beasley MRN: [id number] Date of Birth: 04-02-1981  Today's Date: 03/07/2017 PT Individual Time: 0964-3838 PT Individual Time Calculation (min): 50 min   Short Term Goals: Week 3:  PT Short Term Goal 1 (Week 3): Pt will demonstrate bed mobility modI PT Short Term Goal 2 (Week 3): Pt will demonstrate transfers min guard PT Short Term Goal 3 (Week 3): Pt will ambulate x150' consistent modA PT Short Term Goal 4 (Week 3): Pt will ascend/descend 12 stairs 6-inch height minA  Skilled Therapeutic Interventions/Progress Updates: Pt received supine in bed, denies pain and agreeable to treatment. Pt on room air with PMSV throughout session, no apparent distress. Supine<>sit with S and increased time, use of LUE to manage RLE. Gait to/from gym x125' each with min guard/minA overall however occasional LOB to R side requiring modA to correct and prevent fall. Dynamic gait in hall to locate numbered targets in order for focus on visual scanning, attention, as well as RLE coordination and attention with direction changes as pt loses balance most often when changing direction; min guard overall but up to maxA to prevent LOB. Attempted to allow pt to begin falling before assisting to allow for awareness of LOB and give pt opportunity to self correct. Standing balance wii game for weight shifting, R weight bearing and stance control, ankle strategy. Standing balance in parallel bars on rocker board oriented medial/lateral; static stance with standbyA, external perturbations to board for focus on ankle/hip strategy, and far R weight shifts and cues to correct to midline without use of LUE on bars. Pt's family arrived (sister, other male relative) and discussed activities pt had been working on in therapy as well as current deficits. Seated scapular elevation and retraction BUE AAROM with mirror visual feedback d/t apraxia. Gait to return to room min/modA.  When pt reached bed, he impulsively laid face first down onto foot of bed. Required maxA to return to standing, turn around and sit on bed. Remained supine in bed at end of session, alarm intact and all needs in reach.      Therapy Documentation Precautions:  Precautions Precautions: Fall Precaution Comments: left flap, no bone graft; Helmet when OOB; R lean in sitting without LUE support Restrictions Weight Bearing Restrictions: No Pain: Pain Assessment Pain Assessment: No/denies pain  See Function Navigator for Current Functional Status.   Therapy/Group: Individual Therapy  Luberta Mutter 03/07/2017, 3:25 PM

## 2017-03-08 ENCOUNTER — Inpatient Hospital Stay (HOSPITAL_COMMUNITY): Payer: Self-pay

## 2017-03-08 LAB — GLUCOSE, CAPILLARY
GLUCOSE-CAPILLARY: 129 mg/dL — AB (ref 65–99)
Glucose-Capillary: 109 mg/dL — ABNORMAL HIGH (ref 65–99)
Glucose-Capillary: 113 mg/dL — ABNORMAL HIGH (ref 65–99)
Glucose-Capillary: 106 mg/dL — ABNORMAL HIGH (ref 65–99)
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
## 2017-03-08 NOTE — Progress Notes (Signed)
RT in for trach check. Pt sleeping, no obvious respiratory distress noted. RT Will continue to monitor.

## 2017-03-08 NOTE — Progress Notes (Signed)
Occupational Therapy Session Note  Patient Details  Name: Arthur Beasley MRN: [id number] Date of Birth: 05-11-1981  Today's Date: 03/08/2017 OT Individual Time: 4098-1191 OT Individual Time Calculation (min): 58 min    Short Term Goals: Week 3:  OT Short Term Goal 1 (Week 3): Pt will perform bathing tasks with mod A sit<>stand from seat. OT Short Term Goal 2 (Week 3): Pt will thread BLE into pants with min A while seated in w/c OT Short Term Goal 3 (Week 3): Pt will thread B socks with min A OT Short Term Goal 4 (Week 3): Pt will perform toileting tasks with steady A  Skilled Therapeutic Interventions/Progress Updates:    Pt resting in bed upon arrival with RN present.  Pt agreeable to engaging in BADL retraining including bathing at shower level and dressing with sit<>stand from w/c at sink.  Pt amb without AD (steady A) to bathroom and sat on TTB for shower.  Pt continues to become agitated when assistance offered to bathing tasks and is quite adamant that shower curtain be kept closed (therapist periodically checks on pt).  Pt stands in shower to bathe buttocks. Pt continues to reluctantly require assistance with bathing LUE and buttocks. Pt amb back into room for dressing with sit<>stand from w/c at sink.  Pt completed all dressing tasks requiring assistance only for threading his RUE into sleeve.  Pt refuses assistance with teaching of hemi dressing techniques for UB dressing tasks. No bathing or dressing apraxia noted during session. Pt returned to bed and remained in bed with all needs within reach and bed alarm activated.  Focus on functional amb without AD, standing balance, safety awareness, task initiation, sequencing, and activity tolerance to increase independence with BADLs.   Therapy Documentation Precautions:  Precautions Precautions: Fall Precaution Comments: left flap, no bone graft; Helmet when OOB; R lean in sitting without LUE support Restrictions Weight Bearing  Restrictions: No Pain:  Pt denied pain  See Function Navigator for Current Functional Status.   Therapy/Group: Individual Therapy  Leroy Libman 03/08/2017, 9:01 AM

## 2017-03-08 NOTE — Progress Notes (Signed)
Pt sleeping, resting comfortably at this time. No obvious respiratory distress noted. RT will continue to monitor.

## 2017-03-09 ENCOUNTER — Inpatient Hospital Stay (HOSPITAL_COMMUNITY): Payer: Medicaid Other

## 2017-03-09 ENCOUNTER — Inpatient Hospital Stay (HOSPITAL_COMMUNITY): Payer: Self-pay

## 2017-03-09 ENCOUNTER — Inpatient Hospital Stay (HOSPITAL_COMMUNITY): Payer: Self-pay | Admitting: Physical Therapy

## 2017-03-09 ENCOUNTER — Inpatient Hospital Stay (HOSPITAL_COMMUNITY): Payer: Medicaid Other | Admitting: Speech Pathology

## 2017-03-09 LAB — GLUCOSE, CAPILLARY
GLUCOSE-CAPILLARY: 121 mg/dL — AB (ref 65–99)
GLUCOSE-CAPILLARY: 126 mg/dL — AB (ref 65–99)
GLUCOSE-CAPILLARY: 143 mg/dL — AB (ref 65–99)
GLUCOSE-CAPILLARY: 106 mg/dL — AB (ref 65–99)
Glucose-Capillary: 103 mg/dL — ABNORMAL HIGH (ref 65–99)
Glucose-Capillary: 82 mg/dL (ref 65–99)

## 2017-03-09 NOTE — Progress Notes (Signed)
Occupational Therapy Note  Patient Details  Name: AVRAHAM BENISH MRN: [id number] Date of Birth: 06-18-1981  Today's Date: 03/09/2017 OT Missed Time: 54 Minutes Missed Time Reason: Patient unwilling/refused to participate without medical reason  Pt asleep upon arrival and required min multimodal cues to arouse.  Multiple attempts to encourage pt to get OOB for shower, use toilet, etc.  Pt continued to refuse therapies and physically resisted when tactile cues provided.  Pt missed 60 mins skilled OT services 2/2 refusal.    Leroy Libman 03/09/2017, 7:30 AM

## 2017-03-09 NOTE — Progress Notes (Signed)
Modified Barium Swallow Progress Note  Patient Details  Name: Arthur Beasley MRN: [id number] Date of Birth: [birth year]  Today's Date: 03/09/2017  Modified Barium Swallow completed.  Full report located under Chart Review in the Imaging Section.  Brief recommendations include the following:  Clinical Impression  Pt presents with mild oropharyngeal dysphagia c/b delayed swallow initiation and mild pharyngeal residue. Pt's jaw continues to be wired shut and PMSV in place for instrumental study. Pt with swallow initiation at pyriform sinuses with thin liquids via straw, mild pharyngeal residue mostly at level of pyriform sinsuses which were cleared with double swallow. Very shallow frank penetration noted with consecutive sips. Pt consumed trials pf puree but sipping through front teeth. Pt with swallow initiation at the vallecula with mild vallecular residue. Alternating liquids with puree were effective in clearing all residue. Pt is appropriate for upgrade to full clear liquid diet via straw with PMSV in place. Recommend further trials of puree during ST services to assess for swallow fatigue but can be upgraded at bedside.  Recommend use of double swallow and alternating liquids with solids.  Of note, pt with increased resistance to wearing PMSV as evidenced by not indicating that he had it on himself when brought to radiology. Pt wore during MBS but refuses to wear after study despite Total [employer] education. Pt with increased visual resistance and frustration before leaving radiology. He would not let SLP have PMSV and the lease was missing pointed tip (per nursing note, pt attempted to tear PMSV off). Pt has been tolerating PMSV without any decrease in vitals and no physical distress. PMSV MUST BE WORN WHEN CONSUMING FOOD ITEMS.  Will initiate full clear liquid diet on 03/10/17 when pt is more agreeable to PMSV and compensatory swallow strategies can be executed.    Swallow Evaluation  Recommendations       SLP Diet Recommendations: Thin liquid;Other (Comment) (Full clear liquid diet)   Liquid Administration via: Straw   Medication Administration: Via alternative means   Supervision: Staff to assist with self feeding;Full supervision/cueing for compensatory strategies   Compensations: Minimize environmental distractions;Slow rate;Small sips/bites   Postural Changes: Seated upright at 90 degrees   Oral Care Recommendations: Oral care BID   Other Recommendations: Have oral suction available   Naylene Foell B. Rutherford Nail M.S., [place] 03/09/2017,12:29 PM
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
## 2017-03-09 NOTE — Progress Notes (Signed)
Physical Therapy Session Note  Patient Details  Name: Arthur Beasley MRN: [id number] Date of Birth: 23-Aug-1981  Today's Date: 03/09/2017 PT Individual Time: 1000-1015 and 1445-1515 PT Individual Time Calculation (min): 15 min and 30 min (total 45 min)   Short Term Goals: Week 3:  PT Short Term Goal 1 (Week 3): Pt will demonstrate bed mobility modI PT Short Term Goal 2 (Week 3): Pt will demonstrate transfers min guard PT Short Term Goal 3 (Week 3): Pt will ambulate x150' consistent modA PT Short Term Goal 4 (Week 3): Pt will ascend/descend 12 stairs 6-inch height minA  Skilled Therapeutic Interventions/Progress Updates: Tx 1: Pt received supine in bed, repeatedly pointing towards bathroom indicating he needed to go. Supine>sit with S. Therapist instructed pt that shoes needed to be worn d/t high fall risk and likelihood of slipping in regular socks; pt appears irritated but ultimately agreeable to put on slippers. Pt stood with min guard and then gesturing to therapist to not touch him; attempted to reason with pt and remind him that he is unsafe to ambulate without assistance; pt turned quickly towards restroom and began walking, losing balance to R side d/t inattention. As therapist attempted to prevent fall, pt physically pushing therapist away from him using L arm, elbow. Attempted to guide pt back to sit on bed, however pt resisting and continues attempting to walk towards bathroom while pushing therapist. Ultimately able to stop pt briefly leaning against sink; pt told that if he would get to the bathroom and sit therapist would take hands off him. Nurse secretary/tech arrived to assist; provided education on need to continued physical assist, importance of following rules for shoe, helmet and assist for safety. Required +2A to return pt to w/c after toileting d/t pt resisting and refusing assistance . Stand pivot transfer to bed min guard with pt impulsively jumping up from unlocked w/c. Pt  allowed to remain in bed to deescalate, alarm intact and all needs in reach.   Tx 2: Pt received supine in bed, agreeable to take a shower. Pt dons shoes EOB. When therapist warned pt that I would physically assist as little as possible when pt ambulated to bathroom, pt got frustrated, took shoes off and laid back down in bed. Encouraged pt to participate, reminded him that he gets off balance occasionally, and requires assist to prevent a fall that could cause him serious injury and longer time spent in the hospital bed. Pt continues to decline. NT spoke with pt at length and encouraged him to participate; pt then willing to get up. One major LOB to R side while sitting in bed, pt recovered without assist. Gait in/out of bathroom with min guard. Pt removed clothes in standing with modA to maintain balance. Pt seated on shower chair and declines assist during shower; performed majority of bathing with S, LUE and back/buttocks unwashed d/t pt not willing to receive assistance. Pt dons shirt minA from sister d/t incorrect sequencing despite cueing. Dons boxers and shorts in sitting with standbyA and increased time. Pt returned to bed after dressing, unwilling to participate further. Remained in bed with alarm intact and all needs in reach.      Therapy Documentation Precautions:  Precautions Precautions: Fall Precaution Comments: left flap, no bone graft; Helmet when OOB; R lean in sitting without LUE support Restrictions Weight Bearing Restrictions: No General: PT Amount of Missed Time (min): 30 Minutes (AM session) PT Missed Treatment Reason: Increased agitation PT Amount of Missed Time (min): 15  Minutes PT Missed Treatment Reason: Patient unwilling to participate (PM session)  Pain: Pain Assessment Pain Assessment: No/denies pain Pain Score: 0-No pain  See Function Navigator for Current Functional Status.   Therapy/Group: Individual Therapy  Luberta Mutter 03/09/2017, 10:41 AM

## 2017-03-10 ENCOUNTER — Inpatient Hospital Stay (HOSPITAL_COMMUNITY): Payer: Self-pay | Admitting: Physical Therapy

## 2017-03-10 ENCOUNTER — Inpatient Hospital Stay (HOSPITAL_COMMUNITY): Payer: Medicaid Other | Admitting: Speech Pathology

## 2017-03-10 ENCOUNTER — Ambulatory Visit: Payer: Self-pay | Admitting: Plastic Surgery

## 2017-03-10 ENCOUNTER — Inpatient Hospital Stay (HOSPITAL_COMMUNITY): Payer: Self-pay

## 2017-03-10 DIAGNOSIS — S02609A Fracture of mandible, unspecified, initial encounter for closed fracture: Secondary | ICD-10-CM

## 2017-03-10 LAB — GLUCOSE, CAPILLARY
GLUCOSE-CAPILLARY: 124 mg/dL — AB (ref 65–99)
Glucose-Capillary: 101 mg/dL — ABNORMAL HIGH (ref 65–99)
Glucose-Capillary: 130 mg/dL — ABNORMAL HIGH (ref 65–99)
Glucose-Capillary: 101 mg/dL — ABNORMAL HIGH (ref 65–99)

## 2017-03-10 MED ORDER — JEVITY 1.5 CAL/FIBER PO LIQD
350.0000 mL | Freq: Four times a day (QID) | ORAL | Status: DC
  Administered 2017-03-10 – 2017-03-11 (×5): 350 mL
  Filled 2017-03-10 (×11): qty 1000
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
## 2017-03-10 NOTE — Progress Notes (Signed)
Physical Therapy Session Note  Patient Details  Name: Arthur Beasley MRN: [id number] Date of Birth: 08-26-1981  Today's Date: 03/10/2017 PT Individual Time: 8343-7357 PT Individual Time Calculation (min): 20 min   Short Term Goals: Week 2:  PT Short Term Goal 1 (Week 2): Pt will perform bed mobility minA PT Short Term Goal 1 - Progress (Week 2): Met PT Short Term Goal 2 (Week 2): Pt will perform transfers minA w/c <>bed PT Short Term Goal 2 - Progress (Week 2): Met PT Short Term Goal 3 (Week 2): Pt will ambulate >75' modA and LRAD PT Short Term Goal 3 - Progress (Week 2): Met PT Short Term Goal 4 (Week 2): Pt will propel w/c 150' with L hemi technique and S PT Short Term Goal 4 - Progress (Week 2): Met PT Short Term Goal 5 (Week 2): Pt will initiate stair training PT Short Term Goal 5 - Progress (Week 2): Met  Skilled Therapeutic Interventions/Progress Updates:    Pt in bed upon arrival, initially refusing any activity with PT. Encouragement provided and pt eventually willing to participate in bed exercises but continuing to refuse any mobility. Exercises included bilateral; Quad sets, SLR, hip abduction/adduction, heel slides, SAQ (all 1X10). Pt needing repeated cues for task as well as counting. Multimodal cues provided for exercise technique. Pt in bed with bed alarm on and all needs in reach. Discussed need for mobility and activity to achieve goals.   Therapy Documentation Precautions:  Precautions Precautions: Fall Precaution Comments: left flap, no bone graft; Helmet when OOB; R lean in sitting without LUE support Restrictions Weight Bearing Restrictions: No General: PT Amount of Missed Time (min): 10 Minutes PT Missed Treatment Reason: Patient unwilling to participate Pain: Denies pain  See Function Navigator for Current Functional Status.   Therapy/Group: Individual Therapy  Linard Millers, PT 03/10/2017, 3:43 PM

## 2017-03-10 NOTE — Progress Notes (Signed)
Occupational Therapy Session Note  Patient Details  Name: Arthur Beasley MRN: [id number] Date of Birth: 04-Sep-1981  Today's Date: 03/10/2017 OT Individual Time: 0900-1000 OT Individual Time Calculation (min): 60 min    Short Term Goals: Week 2:  OT Short Term Goal 1 (Week 2): Pt will self range his RUE with min A. OT Short Term Goal 1 - Progress (Week 2): Met OT Short Term Goal 2 (Week 2): Pt will use his L hand to self cleanse after toileting with min A. OT Short Term Goal 2 - Progress (Week 2): Met OT Short Term Goal 3 (Week 2): Pt will perform will perform bathing tasks with mod a with sit<>stand from w/c at sink OT Short Term Goal 3 - Progress (Week 2): Progressing toward goal OT Short Term Goal 4 (Week 2): Pt will thread BLE into pants with mod A while seated in w/c. OT Short Term Goal 4 - Progress (Week 2): Met OT Short Term Goal 5 (Week 2): Pt will don pullover shirt with mod A while seated in w/c  OT Short Term Goal 5 - Progress (Week 2): Met Week 3:  OT Short Term Goal 1 (Week 3): Pt will perform bathing tasks with mod A sit<>stand from seat. OT Short Term Goal 2 (Week 3): Pt will thread BLE into pants with min A while seated in w/c OT Short Term Goal 3 (Week 3): Pt will thread B socks with min A OT Short Term Goal 4 (Week 3): Pt will perform toileting tasks with steady A  Skilled Therapeutic Interventions/Progress Updates:    Pt resting in bed upon arrival and initially declined getting OOB.  Upon further questioning, pt gestured towards the bathroom.  Pt continues to refuse to wear PMSV during therapy.  Pt amb without AD to bathroom and completed toileting tasks without assistance.  Pt transferred to shower seat and began doffing clothing.  Pt required assistance doffing shirt but was able doff other clothing without assistance.  Pt continues to refuse education regarding hemi bathing strategies/techniques and refuses assistance during bathing tasks.  Pt donned underpants  while in bathroom and amb wihtout AD to EOB to complete dressing tasks.  Pt dons LB clothing without assistance and more than a reasonable amount of time.  Pt refused assistance donning shirt and refuses instructional cues for hemi dressing techniques.  Pt placed shirt over head first and then threaded LUE.  Pt attempted to thread RUE numerous times but was unsuccessful.  Pt engaged in conversation regarding UB dressing tasks and strategies and pt agreed to therapist assisting during dressing tasks tomorrow.  Pt returned to bed and remained in bed with bed alarm activated and all needs withhin reach.  Therapy Documentation Precautions:  Precautions Precautions: Fall Precaution Comments: left flap, no bone graft; Helmet when OOB; R lean in sitting without LUE support Restrictions Weight Bearing Restrictions: No Pain: Pain Assessment Pain Assessment: No/denies pain Faces Pain Scale: No hurt  See Function Navigator for Current Functional Status.   Therapy/Group: Individual Therapy  Leroy Libman 03/10/2017, 12:09 PM

## 2017-03-10 NOTE — Progress Notes (Signed)
Speech Language Pathology Daily Session Note  Patient Details  Name: Arthur Beasley MRN: [id number] Date of Birth: 01/04/1981  Today's Date: 03/10/2017 SLP Individual Time: 1478-2956 SLP Individual Time Calculation (min): 60 min  Short Term Goals: Week 3: SLP Short Term Goal 1 (Week 3): Pt will complete basic, familiar problem solving tasks related to ADLs with Min A cues.  SLP Short Term Goal 2 (Week 3): Pt will imitate bilabial and vowel sounds with verbal and visual model in 75% of opportunities.  SLP Short Term Goal 3 (Week 3): Pt will use multimodal communication to communicate wants and needs in 50% of opportunities with Mod A multimodal cues.  SLP Short Term Goal 4 (Week 3): Pt will follow 2 step simple directions with 75% accuracy and Mod A cues.  SLP Short Term Goal 5 (Week 3): Pt will consume thin liquids without overt s/s of aspiration to demonstrate readiness for further instrumental study/diet advancement.  SLP Short Term Goal 6 (Week 3): Pt will consume thin liquids without overt s/s of aspiration to demonstrate readiness for further instrumental study/diet advancement.   Skilled Therapeutic Interventions: Skilled treatment session focused on dysphagia and expressive speech goals. SLP facilitated session by providing skilled observation of pt consuming clear, thin liquid full diet. Pt consumed without overt s/s of aspiration. Recommend allowing this diet WITH USE OF PMSV SPEAKING VALVE - PT SHOULD NOT CONSUME WITHOUT SPEAKING VALVE IN USE. With Max A multimodal cues, pt unable to imitate single vowel sounds or bilabials. Pt with new paraphasic utterance "muda" that he frequently says as verbal response. Pt is aware but unable to self-correct. Pt was returned to room, left in bed, PMSV removed and bed alarm on. Continue per current plan of care.      Function:  Eating Eating   Modified Consistency Diet: Yes (Full liquid diet) Eating Assist Level: Swallowing techniques: self  managed           Cognition Comprehension Comprehension assist level: Understands basic 75 - 89% of the time/ requires cueing 10 - 24% of the time  Expression Expression assistive device: Talk trach valve Expression assist level: Expresses basic 50 - 74% of the time/requires cueing 25 - 49% of the time. Needs to repeat parts of sentences.  Social Interaction Social Interaction assist level: Interacts appropriately 90% of the time - Needs monitoring or encouragement for participation or interaction.  Problem Solving Problem solving assist level: Solves basic 50 - 74% of the time/requires cueing 25 - 49% of the time  Memory Memory assist level: Recognizes or recalls 75 - 89% of the time/requires cueing 10 - 24% of the time    Pain Pain Assessment Pain Assessment: No/denies pain Faces Pain Scale: No hurt  Therapy/Group: Individual Therapy  Punam Broussard Rutherford Nail 03/10/2017, 12:36 PM

## 2017-03-10 NOTE — Progress Notes (Signed)
Bed alarm went on,when RN walked in to the room found pt's girlfriend trying to assist him  To go to the bathroom.RN explained to them that she has not been traine to assist him and that they need to call for assistance.Pt. Was sitting on the edge of the bed with his helmet on.RN went to assist him and he refused to be hand held assist several times,when finally went in to the bathroom,pt. Wanted to be leave alone.RN explained the reason why a staff member needs to be with him at all the time for safety reasons.Pt. Got very upset,pushed RN's hands away several times and insist to walked out of the room by himself.RN call for assistance,other staff members were able to redirect pt. Back to the bed.
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
## 2017-03-10 NOTE — Progress Notes (Signed)
Physical Therapy Session Note  Patient Details  Name: Arthur Beasley MRN: [id number] Date of Birth: [birth year]  Today's Date: 03/10/2017 PT Individual Time: 1000-1100 PT Individual Time Calculation (min): 60 min   Short Term Goals: Week 3:  PT Short Term Goal 1 (Week 3): Pt will demonstrate bed mobility modI PT Short Term Goal 2 (Week 3): Pt will demonstrate transfers min guard PT Short Term Goal 3 (Week 3): Pt will ambulate x150' consistent modA PT Short Term Goal 4 (Week 3): Pt will ascend/descend 12 stairs 6-inch height minA  Skilled Therapeutic Interventions/Progress Updates: pt received supine in bed, denies pain and agreeable to treatment. +2 available during session for safety d/t pt impulsivity and agitation. Pt dons slippers with S and increased time. Gait within room with min guard. Pt pointing to light above mirror, suspect he wanted light turned off. Therapist attempted to have pt ambulate over to sink to turn light off however pt becoming agitated, elbowing therapist and losing balance to R side at the same time. Second person turned off light to reduce pt agitation. Gait to gym with min guard, occasional minA d/t R inattention, LOB to R side. Standing basketball throws with LUE, including locating and picking up numbered targets from the floor for added cognitive dual task. Min guard overall, however required maxA to prevent fall during one attempt to pick up numbers from floor. Basketball toss and bounce pass with therapist in hall; again min guard overall but occasional mod/maxA to prevent fall with impaired righting and stepping reactions. Stairs performed x12 on 3" and 6" stairs with close S and LUE on rail. Lateral step ups 3x15 on RLE with BUE support on rail, dycem used to help maintain RUE position. Heel raises 3x15 on step. Returned to room with gait min guard/minA. Returned to supine with S and increased time. Remained supine in bed at end of session, all needs in reach.      Therapy Documentation Precautions:  Precautions Precautions: Fall Precaution Comments: left flap, no bone graft; Helmet when OOB; R lean in sitting without LUE support Restrictions Weight Bearing Restrictions: No   See Function Navigator for Current Functional Status.   Therapy/Group: Individual Therapy  Luberta Mutter 03/10/2017, 3:36 PM

## 2017-03-11 ENCOUNTER — Inpatient Hospital Stay (HOSPITAL_COMMUNITY): Payer: Medicaid Other | Admitting: Speech Pathology

## 2017-03-11 ENCOUNTER — Inpatient Hospital Stay (HOSPITAL_COMMUNITY): Payer: Self-pay

## 2017-03-11 ENCOUNTER — Inpatient Hospital Stay (HOSPITAL_COMMUNITY): Payer: Self-pay | Admitting: Physical Therapy

## 2017-03-11 LAB — GLUCOSE, CAPILLARY
GLUCOSE-CAPILLARY: 100 mg/dL — AB (ref 65–99)
GLUCOSE-CAPILLARY: 131 mg/dL — AB (ref 65–99)
Glucose-Capillary: 106 mg/dL — ABNORMAL HIGH (ref 65–99)

## 2017-03-11 NOTE — Plan of Care (Signed)
Problem: RH Dressing Goal: LTG Patient will perform lower body dressing w/assist (OT) LTG: Patient will perform lower body dressing with assist, with/without cues in positioning using equipment (OT)  Upgraded due to progress JLS  Problem: RH Toileting Goal: LTG Patient will perform toileting w/assist, cues/equip (OT) LTG: Patient will perform toiletiing (clothes management/hygiene) with assist, with/without cues using equipment (OT)  Upgraded due to progress JLS  Problem: RH Vision Goal: RH LTG Vision (Specify) Correction of goal.   Problem: RH Toilet Transfers Goal: LTG Patient will perform toilet transfers w/assist (OT) LTG: Patient will perform toilet transfers with assist, with/without cues using equipment (OT)  Upgraded due to progress JLS  Problem: RH Tub/Shower Transfers Goal: LTG Patient will perform tub/shower transfers w/assist (OT) LTG: Patient will perform tub/shower transfers with assist, with/without cues using equipment (OT)  Upgraded due to progress JLS  Problem: RH Awareness Goal: LTG: Patient will demonstrate intellectual/emergent (OT) LTG: Patient will demonstrate intellectual/emergent/anticipatory awareness with assist during a functional activity  (OT)  Downgraded due to requiring multimodal cues for safety due to decr awareness

## 2017-03-11 NOTE — Progress Notes (Signed)
Came to pt room as RN assisting pt called for help as pt was shooing her away and refusing to listen to her instructions.  Pt was trying to walk away from the RN without his helmet on, again instruct pt to put helmet on, pt girlfriend assist and is also telling pt, "this is why you need to listen, they are trying to keep you from falling", pt shaking head and look of frustration as RN applies helmet and tries to assist him to BR, pt shooing her away, audibly huffs when RN tries to assist with clothing so he can sit on toilet, instruct pt I must stand by while he is in the bathroom and told him to pull call light and  do not get up until we are in there with you, pt proceeds to stand as he is done and did not call for RN.  Again pt shooing RN away with his hand when I try to stand by him for assist, return pt to bed, bed alarm on.  Instruct to call for help and not to get up without staff.

## 2017-03-11 NOTE — Progress Notes (Signed)
Shift Summary: 7p-7a Pt is refusing to wear continuous pulse ox, encourage pt that with a trach this is important, however, has a look of aggravation on his face and shakes his head no and shoos RN away with his other hand, when getting up to the bathroom, pt refuses helmet and starts to walk as RN is following behind him and having to repeatedly ask him to stop so we can put it on.  When RN was administering tube feeding, pt again gets frustrated and rolls eyes as RN is having to administer bolus feeding and then water to rinse tube. Reviewed with pt reasons why we do things the way we do and we have certain procedures we need to do to keep him safe.

## 2017-03-11 NOTE — Progress Notes (Signed)
Occupational Therapy Note  Patient Details  Name: Arthur Beasley MRN: [id number] Date of Birth: [birth year]  Today's Date: 03/11/2017 OT Missed Time: 81 Minutes Missed Time Reason: Patient unwilling/refused to participate without medical reason  Pt asleep in bed upon arrival but easily aroused.  Pt repeatedly refused to get OOB and participate in therapy.  Pt declined getting OOB to use bathroom.  Pt declined getting OOB to take a shower.  Pt repeatedly pointed at lights in room to be turned off.  Checked on pt X 3 to offer therapy.  Pt continued to refuse.  Pt missed 60 mins skilled OT services.   [name] [facility] 03/11/2017, 7:50 AM

## 2017-03-11 NOTE — Progress Notes (Signed)
Speech Language Pathology Daily Session Note  Patient Details  Name: Arthur Beasley MRN: [id number] Date of Birth: [birth year]  Today's Date: 03/11/2017 SLP Individual Time: 0830-0930 SLP Individual Time Calculation (min): 60 min  Short Term Goals: Week 3: SLP Short Term Goal 1 (Week 3): Pt will complete basic, familiar problem solving tasks related to ADLs with Min A cues.  SLP Short Term Goal 2 (Week 3): Pt will imitate bilabial and vowel sounds with verbal and visual model in 75% of opportunities.  SLP Short Term Goal 3 (Week 3): Pt will use multimodal communication to communicate wants and needs in 50% of opportunities with Mod A multimodal cues.  SLP Short Term Goal 4 (Week 3): Pt will follow 2 step simple directions with 75% accuracy and Mod A cues.  SLP Short Term Goal 5 (Week 3): Pt will consume thin liquids without overt s/s of aspiration to demonstrate readiness for further instrumental study/diet advancement.  SLP Short Term Goal 6 (Week 3): Pt will consume thin liquids without overt s/s of aspiration to demonstrate readiness for further instrumental study/diet advancement.   Skilled Therapeutic Interventions: Skilled treatment session focused on dysphagia and communication goals. Pt donned PMSV when SLP entered room. SLP facilitated session by providing skilled observation of pt consuming trial tray of full liquid diet. Pt consumed without overt s/s of aspiration. Recommend pt continuing this diet with full nursing supervision d/t cognitive deficits. SLP further facilitated session by providing Max A verbal cues to select object in field of 2 when given a phrase to read. Pt was 50% accurate indicating decreased ability to read written language at the phrase level. Pt was returned to room, left upright in bed, PMSV removed and all needs within reach. Continue per current plan of care.      Function:  Eating Eating   Modified Consistency Diet: Yes (full liquid diet) Eating  Assist Level: Set up assist for;Supervision or verbal cues   Eating Set Up Assist For: Opening containers       Cognition Comprehension Comprehension assist level: Understands basic 75 - 89% of the time/ requires cueing 10 - 24% of the time  Expression Expression assistive device: Talk trach valve Expression assist level: Expresses basic 50 - 74% of the time/requires cueing 25 - 49% of the time. Needs to repeat parts of sentences.  Social Interaction Social Interaction assist level: Interacts appropriately 90% of the time - Needs monitoring or encouragement for participation or interaction.  Problem Solving Problem solving assist level: Solves basic 50 - 74% of the time/requires cueing 25 - 49% of the time  Memory Memory assist level: Recognizes or recalls 75 - 89% of the time/requires cueing 10 - 24% of the time    Pain    Therapy/Group: Individual Therapy   Marvens Hollars B. Rutherford Nail, M.S., [place] 03/11/2017, 12:30 PM

## 2017-03-11 NOTE — Progress Notes (Signed)
Physical Therapy Session Note  Patient Details  Name: Arthur Beasley MRN: [id number] Date of Birth: January 17, 1981  Today's Date: 03/11/2017 PT Individual Time: 1300-1425 PT Individual Time Calculation (min): 85 min   Short Term Goals: Week 3:  PT Short Term Goal 1 (Week 3): Pt will demonstrate bed mobility modI PT Short Term Goal 2 (Week 3): Pt will demonstrate transfers min guard PT Short Term Goal 3 (Week 3): Pt will ambulate x150' consistent modA PT Short Term Goal 4 (Week 3): Pt will ascend/descend 12 stairs 6-inch height minA Week 4:     Skilled Therapeutic Interventions/Progress Updates:   PT tech present for treatment.  Pt received supine in bed and agreeable to PT. Supine>sit transfer with supervision assist and min cues for cues safety. Pt motioning to therapist that his shirt is dirty and wanting to change it. Pt attempted to don shift but required mod assist to feed RUE through sleeve. Pt donned slippers sitting EOB with supervision assist and increased time and effort. Pt noted to become frustrated when PT tech offered to assist.    Gait training through rehab unit 258f, [id number] supervision-min assist from PT pt noted to have multiple instances of LOB to the R due to poor foot placement. Pt became frustrated each time PT was required to provide increased assistance to prevent LOB.   Basketball 2 bouts x 5 minutes each with pt to toss and catch ball with the LUE. 2 instances of near LOB to the R and min assist from PT to prevent LOB. BallDiona Foleys off trampoline anteriorly and to the R with use of LUE. PT encouraged use of the R UE through all ball activities, but pt indicated that he was unable and unwilling.   Picking numbered discs up from floor while weaving through cones. Pt unable to perform dual task of picking up objects and weave successfully. Addition dynamic gait to weave through 12 cones with supervision-min assist from PT. Stepping over 1-3 inch  obstacles including cane and bolsters  4 bouts x 3 each with min-mod assist to prevent R lateral LOB. Pt noted to have improved success with step to gait over obstacle with increased trials.   Pt returned to room and performed ambulatory transfer to bed with min assist. Sit>supine completed with supervision assist and left supine in bed with call bell in reach and all needs met.          Therapy Documentation Precautions:  Precautions Precautions: Fall Precaution Comments: left flap, no bone graft; Helmet when OOB; R lean in sitting without LUE support Restrictions Weight Bearing Restrictions: No Vital Signs: Therapy Vitals Temp: 98.8 F (37.1 C) Temp Source: Axillary Pulse Rate: (!) 105 Resp: 20 BP: 119/77 Patient Position (if appropriate): Lying Oxygen Therapy SpO2: 99 % O2 Device: Not Delivered FiO2 (%): 21 % Pain: 0/10   See Function Navigator for Current Functional Status.   Therapy/Group: Individual Therapy  [name]5/2018, 2:44 PM
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
## 2017-03-12 ENCOUNTER — Inpatient Hospital Stay (HOSPITAL_COMMUNITY): Payer: Medicaid Other | Admitting: Speech Pathology

## 2017-03-12 ENCOUNTER — Inpatient Hospital Stay (HOSPITAL_COMMUNITY): Payer: Medicaid Other | Admitting: Certified Registered Nurse Anesthetist

## 2017-03-12 ENCOUNTER — Inpatient Hospital Stay (HOSPITAL_COMMUNITY): Payer: Medicaid Other | Admitting: Physical Therapy

## 2017-03-12 ENCOUNTER — Inpatient Hospital Stay (HOSPITAL_COMMUNITY): Payer: Medicaid Other | Admitting: Occupational Therapy

## 2017-03-12 ENCOUNTER — Ambulatory Visit (HOSPITAL_COMMUNITY): Admission: RE | Admit: 2017-03-12 | Payer: Medicaid Other | Source: Ambulatory Visit | Admitting: Plastic Surgery

## 2017-03-12 ENCOUNTER — Encounter (HOSPITAL_COMMUNITY)
Admission: RE | Disposition: A | Discharge status: Home Health | Payer: Self-pay | Source: Intra-hospital | Attending: Physical Medicine & Rehabilitation

## 2017-03-12 ENCOUNTER — Encounter (HOSPITAL_COMMUNITY): Payer: Self-pay | Admitting: Plastic Surgery

## 2017-03-12 HISTORY — PX: HARDWARE REMOVAL: SHX979

## 2017-03-12 LAB — BASIC METABOLIC PANEL
Anion gap: 11 (ref 5–15)
BUN: 9 mg/dL (ref 6–20)
CALCIUM: 10 mg/dL (ref 8.9–10.3)
CO2: 27 mmol/L (ref 22–32)
CREATININE: 0.85 mg/dL (ref 0.61–1.24)
Chloride: 98 mmol/L — ABNORMAL LOW (ref 101–111)
GFR calc Af Amer: >60 mL/min (ref >60)
GFR calc non Af Amer: >60 mL/min (ref >60)
GLUCOSE: 103 mg/dL — AB (ref 65–99)
Potassium: 4.7 mmol/L (ref 3.5–5.1)
SODIUM: 136 mmol/L (ref 135–145)

## 2017-03-12 LAB — CBC
HEMATOCRIT: 36 % — AB (ref 39.0–52.0)
Hemoglobin: 11.6 g/dL — ABNORMAL LOW (ref 13.0–17.0)
MCH: 28.2 pg (ref 26.0–34.0)
MCHC: 32.2 g/dL (ref 30.0–36.0)
MCV: 87.6 fL (ref 78.0–100.0)
PLATELETS: 330 10*3/uL (ref 150–400)
RBC: 4.11 MIL/uL — ABNORMAL LOW (ref 4.22–5.81)
RDW: 13.9 % (ref 11.5–15.5)
WBC: 6.5 10*3/uL (ref 4.0–10.5)

## 2017-03-12 LAB — GLUCOSE, CAPILLARY
Glucose-Capillary: 117 mg/dL — ABNORMAL HIGH (ref 65–99)
Glucose-Capillary: 119 mg/dL — ABNORMAL HIGH (ref 65–99)
Glucose-Capillary: 119 mg/dL — ABNORMAL HIGH (ref 65–99)
Glucose-Capillary: 128 mg/dL — ABNORMAL HIGH (ref 65–99)
Glucose-Capillary: 92 mg/dL (ref 65–99)
Glucose-Capillary: 94 mg/dL (ref 65–99)

## 2017-03-12 SURGERY — REMOVAL, HARDWARE
Anesthesia: Monitor Anesthesia Care

## 2017-03-12 MED ORDER — ROCURONIUM BROMIDE 100 MG/10ML IV SOLN
INTRAVENOUS | Status: DC | PRN
  Administered 2017-03-12: 40 mg via INTRAVENOUS

## 2017-03-12 MED ORDER — MIDAZOLAM HCL 5 MG/5ML IJ SOLN
INTRAMUSCULAR | Status: DC | PRN
  Administered 2017-03-12: 1 mg via INTRAVENOUS

## 2017-03-12 MED ORDER — MIDAZOLAM HCL 2 MG/2ML IJ SOLN
INTRAMUSCULAR | Status: AC
  Filled 2017-03-12: qty 2

## 2017-03-12 MED ORDER — PHENYLEPHRINE 40 MCG/ML (10ML) SYRINGE FOR IV PUSH (FOR BLOOD PRESSURE SUPPORT)
PREFILLED_SYRINGE | INTRAVENOUS | Status: DC | PRN
Start: 2017-03-12 — End: 2017-03-12
  Administered 2017-03-12: 80 ug via INTRAVENOUS

## 2017-03-12 MED ORDER — JEVITY 1.5 CAL/FIBER PO LIQD
350.0000 mL | Freq: Four times a day (QID) | ORAL | Status: DC
  Administered 2017-03-12 (×2): 350 mL
  Filled 2017-03-12 (×10): qty 1000

## 2017-03-12 MED ORDER — PROPOFOL 10 MG/ML IV BOLUS
INTRAVENOUS | Status: DC | PRN
  Administered 2017-03-12: 100 mg via INTRAVENOUS

## 2017-03-12 MED ORDER — LIDOCAINE-EPINEPHRINE 1 %-1:100000 IJ SOLN
INTRAMUSCULAR | Status: AC
  Filled 2017-03-12: qty 1

## 2017-03-12 MED ORDER — FENTANYL CITRATE (PF) 250 MCG/5ML IJ SOLN
INTRAMUSCULAR | Status: AC
  Filled 2017-03-12: qty 5

## 2017-03-12 MED ORDER — ONDANSETRON HCL 4 MG/2ML IJ SOLN
INTRAMUSCULAR | Status: DC | PRN
  Administered 2017-03-12: 4 mg via INTRAVENOUS

## 2017-03-12 MED ORDER — FENTANYL CITRATE (PF) 100 MCG/2ML IJ SOLN
25.0000 ug | INTRAMUSCULAR | Status: DC | PRN
  Administered 2017-03-12 (×2): 50 ug via INTRAVENOUS

## 2017-03-12 MED ORDER — 0.9 % SODIUM CHLORIDE (POUR BTL) OPTIME
TOPICAL | Status: DC | PRN
  Administered 2017-03-12: 1000 mL

## 2017-03-12 MED ORDER — OXYCODONE HCL 5 MG/5ML PO SOLN: 5.0000 mg | Freq: Once | ORAL | Status: DC | PRN

## 2017-03-12 MED ORDER — AMLODIPINE BESYLATE 5 MG PO TABS
5.0000 mg | ORAL_TABLET | Freq: Every day | ORAL | Status: DC
  Administered 2017-03-12 – 2017-03-19 (×8): 5 mg via ORAL
  Filled 2017-03-12 (×8): qty 1
  Filled 2017-03-12: qty 2

## 2017-03-12 MED ORDER — LACTATED RINGERS IV SOLN
INTRAVENOUS | Status: DC | PRN
  Administered 2017-03-12: 10:00:00 via INTRAVENOUS

## 2017-03-12 MED ORDER — ONDANSETRON 4 MG PO TBDP
4.0000 mg | ORAL_TABLET | Freq: Three times a day (TID) | ORAL | Status: DC | PRN
  Filled 2017-03-12: qty 1

## 2017-03-12 MED ORDER — OXYCODONE HCL 5 MG PO TABS: 5.0000 mg | ORAL_TABLET | Freq: Once | ORAL | Status: DC | PRN

## 2017-03-12 MED ORDER — ONDANSETRON HCL 4 MG/2ML IJ SOLN: 4.0000 mg | Freq: Once | INTRAMUSCULAR | Status: DC | PRN

## 2017-03-12 MED ORDER — IBUPROFEN 800 MG PO TABS
800.0000 mg | ORAL_TABLET | Freq: Three times a day (TID) | ORAL | Status: DC
  Administered 2017-03-12 – 2017-03-13 (×3): 800 mg via ORAL
  Filled 2017-03-12 (×4): qty 1

## 2017-03-12 MED ORDER — SUCCINYLCHOLINE CHLORIDE 200 MG/10ML IV SOSY
PREFILLED_SYRINGE | INTRAVENOUS | Status: AC
  Filled 2017-03-12: qty 10

## 2017-03-12 MED ORDER — BENZONATATE 100 MG PO CAPS
100.0000 mg | ORAL_CAPSULE | Freq: Three times a day (TID) | ORAL | Status: DC
  Administered 2017-03-13: 100 mg via ORAL
  Filled 2017-03-12 (×2): qty 1

## 2017-03-12 MED ORDER — OXYCODONE HCL ER 10 MG PO T12A: 10.0000 mg | EXTENDED_RELEASE_TABLET | Freq: Every day | ORAL | Status: DC | PRN

## 2017-03-12 MED ORDER — SUGAMMADEX SODIUM 200 MG/2ML IV SOLN
INTRAVENOUS | Status: DC | PRN
  Administered 2017-03-12: 200 mg via INTRAVENOUS

## 2017-03-12 MED ORDER — ONDANSETRON HCL 4 MG/2ML IJ SOLN
INTRAMUSCULAR | Status: AC
Start: 2017-03-12 — End: 2017-03-12
  Filled 2017-03-12: qty 2

## 2017-03-12 MED ORDER — LIDOCAINE HCL (CARDIAC) 20 MG/ML IV SOLN
INTRAVENOUS | Status: DC | PRN
  Administered 2017-03-12: 40 mg via INTRAVENOUS

## 2017-03-12 MED ORDER — EPHEDRINE 5 MG/ML INJ
INTRAVENOUS | Status: AC
  Filled 2017-03-12: qty 10

## 2017-03-12 MED ORDER — PROPOFOL 10 MG/ML IV BOLUS
INTRAVENOUS | Status: AC
  Filled 2017-03-12: qty 20

## 2017-03-12 SURGICAL SUPPLY — 38 items
BLADE 10 SAFETY STRL DISP (BLADE) ×1 IMPLANT
BLADE SURG 15 STRL LF DISP TIS (BLADE) ×1 IMPLANT
BLADE SURG 15 STRL SS (BLADE) ×3
CANISTER SUCT 3000ML PPV (MISCELLANEOUS) ×3 IMPLANT
COVER SURGICAL LIGHT HANDLE (MISCELLANEOUS) ×3 IMPLANT
DECANTER SPIKE VIAL GLASS SM (MISCELLANEOUS) ×1 IMPLANT
DRAPE BACK TABLE (DRAPES) ×3 IMPLANT
DRAPE HALF SHEET 40X57 (DRAPES) ×3 IMPLANT
ELECT COATED BLADE 2.86 ST (ELECTRODE) ×2 IMPLANT
ELECT REM PT RETURN 9FT ADLT (ELECTROSURGICAL) ×3
ELECTRODE REM PT RTRN 9FT ADLT (ELECTROSURGICAL) IMPLANT
GAUZE SPONGE 4X4 16PLY XRAY LF (GAUZE/BANDAGES/DRESSINGS) ×3 IMPLANT
GLOVE BIO SURGEON STRL SZ 6.5 (GLOVE) ×5 IMPLANT
GLOVE BIO SURGEONS STRL SZ 6.5 (GLOVE) ×4
GOWN STRL REUS W/ TWL LRG LVL3 (GOWN DISPOSABLE) ×2 IMPLANT
GOWN STRL REUS W/TWL LRG LVL3 (GOWN DISPOSABLE) ×15
HYDROGEN PEROXIDE 16OZ (MISCELLANEOUS) ×2 IMPLANT
KIT BASIN OR (CUSTOM PROCEDURE TRAY) ×3 IMPLANT
KIT ROOM TURNOVER OR (KITS) ×3 IMPLANT
NDL PRECISIONGLIDE 27X1.5 (NEEDLE) ×1 IMPLANT
NEEDLE PRECISIONGLIDE 27X1.5 (NEEDLE) ×3 IMPLANT
NS IRRIG 1000ML POUR BTL (IV SOLUTION) ×3 IMPLANT
PAD ARMBOARD 7.5X6 YLW CONV (MISCELLANEOUS) ×6 IMPLANT
PENCIL BUTTON HOLSTER BLD 10FT (ELECTRODE) ×2 IMPLANT
SPONGE DRAIN TRACH 4X4 STRL 2S (GAUZE/BANDAGES/DRESSINGS) ×2 IMPLANT
SUCTION FRAZIER HANDLE 10FR (MISCELLANEOUS)
SUCTION TUBE FRAZIER 10FR DISP (MISCELLANEOUS) IMPLANT
SUT CHROMIC 3 0 PS 2 (SUTURE) ×1 IMPLANT
SUT CHROMIC 4 0 PS 2 18 (SUTURE) ×1 IMPLANT
SUT VICRYL 4-0 PS2 18IN ABS (SUTURE) ×1 IMPLANT
SYR BULB 3OZ (MISCELLANEOUS) ×2 IMPLANT
SYR CONTROL 10ML LL (SYRINGE) ×1 IMPLANT
TOWEL OR 17X24 6PK STRL BLUE (TOWEL DISPOSABLE) ×1 IMPLANT
TOWEL OR 17X26 10 PK STRL BLUE (TOWEL DISPOSABLE) ×3 IMPLANT
TUBE CONNECTING 12'X1/4 (SUCTIONS) ×1
TUBE CONNECTING 12X1/4 (SUCTIONS) ×2 IMPLANT
WATER STERILE IRR 1000ML POUR (IV SOLUTION) IMPLANT
YANKAUER SUCT BULB TIP NO VENT (SUCTIONS) ×1 IMPLANT

## 2017-03-12 NOTE — Transfer of Care (Signed)
Immediate Anesthesia Transfer of Care Note  Patient: Arthur Beasley  Procedure(s) Performed: Procedure(s): REMOVAL OF MMF HARDWARE (N/A)  Patient Location: PACU  Anesthesia Type:General  Level of Consciousness: awake, alert , oriented and patient cooperative  Airway & Oxygen Therapy: Patient Spontanous Breathing and Patient connected to nasal cannula oxygen  Post-op Assessment: Report given to RN  Post vital signs: Reviewed and stable  Last Vitals:  Vitals:   03/12/17 0440 03/12/17 1100  BP: 115/88   Pulse: 94   Resp: 18   Temp: 36.7 C (!) (P) 36 C    Last Pain:  Vitals:   03/12/17 1100  TempSrc:   PainSc: (P) Asleep      Patients Stated Pain Goal: 2 (90/21/11 5520)  Complications: No apparent anesthesia complications

## 2017-03-12 NOTE — Progress Notes (Signed)
Occupational Therapy Note  Patient Details  Name: Arthur Beasley MRN: [id number] Date of Birth: 1981-04-01  Pt in bed and refusing to get up for OT session, shaking his head and using his left hand to motion therapist away.  Attempted to repeatedly get pt to participate and transfer to the EOB but he would pull at covers when therapist would try to remove them as well as keeping his eyes shut and not acknowledging any questions therapist would ask.  Pt missed 60 mins OT.  nursing made aware.    Khristy Kalan OTR/L 03/12/2017, 1:19 PM

## 2017-03-12 NOTE — Progress Notes (Signed)
03/12/17-Pt wasn't available for trach check. RT will to trach check at 1200

## 2017-03-12 NOTE — Anesthesia Preprocedure Evaluation (Addendum)
Anesthesia Evaluation  Patient identified by MRN, date of birth, ID band Patient awake and Patient confused    Airway Mallampati: Trach   Neck ROM: Full    Dental  (+) Teeth Intact   Pulmonary Current Smoker,    + rhonchi        Cardiovascular hypertension,  Rhythm:Regular Rate:Normal     Neuro/Psych    GI/Hepatic   Endo/Other    Renal/GU      Musculoskeletal   Abdominal   Peds  Hematology   Anesthesia Other Findings   Reproductive/Obstetrics                           Anesthesia Physical Anesthesia Plan  ASA: III  Anesthesia Plan: MAC and General   Post-op Pain Management:    Induction:   Airway Management Planned: Tracheostomy  Additional Equipment:   Intra-op Plan:   Post-operative Plan: Extubation in OR  Informed Consent: I have reviewed the patients History and Physical, chart, labs and discussed the procedure including the risks, benefits and alternatives for the proposed anesthesia with the patient or authorized representative who has indicated his/her understanding and acceptance.     Plan Discussed with: CRNA and Anesthesiologist  Anesthesia Plan Comments:        Anesthesia Quick Evaluation

## 2017-03-12 NOTE — Progress Notes (Signed)
Social Work Patient ID: Arthur Beasley, male   DOB: 07-24-1981, 36 y.o.   MRN: [id number]   Have reviewed team conference with pt and sister, [name] (via phone).  They are aware that we continue to plan toward 5/4 discharge date and agreeable.  Pleased with progress overall. Checked in with pt this afternoon and he is happy to have the wires removed!  Talked very briefly with him about plan for him to d/c to sister's home but he shakes his head "no".  He had done this last week with sister present and she quickly explained to him that he would need to come to her home because she can provide needed 24/7 care there.  Pt fatigued this afternoon to discuss further but we made plan to talk again about d/c tomorrow.  Adanya Sosinski, LCSW
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
## 2017-03-12 NOTE — Progress Notes (Signed)
Physical Therapy Note  Patient Details  Name: Arthur Beasley MRN: [id number] Date of Birth: 1981-07-16 Today's Date: 03/12/2017    Attempted to see pt for scheduled therapy session.  Pt initially indicating he did not wish to participate and that he was still lethargic from sedation for procedure this AM.  PT attempted to engage pt in conversation and redirect patient into participation but pt closed eyes and stopped responding to therapist.  Left supine in bed with alarm activated.  Call bell in reach.   Yon Schiffman E Penven-Crew 03/12/2017, 3:36 PM

## 2017-03-12 NOTE — Anesthesia Procedure Notes (Signed)
Procedure Name: Intubation Date/Time: 03/12/2017 10:19 AM Performed by: Carney Living Pre-anesthesia Checklist: Patient identified, Emergency Drugs available, Suction available, Patient being monitored and Timeout performed Patient Re-evaluated:Patient Re-evaluated prior to inductionOxygen Delivery Method: Circle system utilized Preoxygenation: Pre-oxygenation with 100% oxygen Intubation Type: IV induction Tube type: Reinforced Tube size: 6.0 mm Number of attempts: 1 Placement Confirmation: positive ETCO2 and breath sounds checked- equal and bilateral Tube secured with: Tape Dental Injury: Teeth and Oropharynx as per pre-operative assessment  Comments: ETT via existing trachestomy

## 2017-03-12 NOTE — Op Note (Signed)
Operative Note   DATE OF OPERATION: 03/12/2017  LOCATION: [facility]  SURGICAL DIVISION: Plastic Surgery  PREOPERATIVE DIAGNOSES:  Mandible fracture  POSTOPERATIVE DIAGNOSES:  same  PROCEDURE:  Removal of maxillomandibular hardware  SURGEON: Theodoro Kos, DO  ASSISTANT: Erlinda Hong, PA  ANESTHESIA:  General.   COMPLICATIONS: None.   INDICATIONS FOR PROCEDURE:  The patient, Arthur Beasley is a 36 y.o. male born on 1981-09-18, is here for treatment of a mandible fracture that was treated with maxillomandibular hardware MRN: [id number]  CONSENT:  Informed consent was obtained directly from the patient. Risks, benefits and alternatives were fully discussed. Specific risks including but not limited to bleeding, infection, hematoma, seroma, scarring, pain, infection, asymmetry, wound healing problems, and need for further surgery were all discussed. The patient did have an ample opportunity to have questions answered to satisfaction.   DESCRIPTION OF PROCEDURE:  The patient was taken to the operating room. SCDs were placed. The patient's operative site was prepped and draped in a sterile fashion. A time out was performed and all information was confirmed to be correct.  Anesthesia was administered.  The maxillomandibular fixation hardware was removed completely.  There was good alignment of the dentition.  The patient tolerated the procedure well.  There were no complications. The patient was allowed to wake from anesthesia and taken to the recovery room in satisfactory condition.

## 2017-03-12 NOTE — Anesthesia Postprocedure Evaluation (Signed)
Anesthesia Post Note  Patient: Arthur Beasley  Procedure(s) Performed: Procedure(s) (LRB): REMOVAL OF MMF HARDWARE (N/A)  Patient location during evaluation: PACU Anesthesia Type: General Level of consciousness: awake and awake and alert Pain management: pain level controlled Vital Signs Assessment: post-procedure vital signs reviewed and stable Respiratory status: spontaneous breathing, nonlabored ventilation and respiratory function stable Cardiovascular status: blood pressure returned to baseline Anesthetic complications: no       Last Vitals:  Vitals:   03/12/17 1521 03/12/17 1550  BP: (!) 129/93   Pulse: 97 96  Resp: 18 18  Temp: 36.5 C     Last Pain:  Vitals:   03/12/17 1521  TempSrc: Oral  PainSc:                  [name]
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
## 2017-03-12 NOTE — Progress Notes (Signed)
Physical Therapy Session Note  Patient Details  Name: Arthur Beasley MRN: [id number] Date of Birth: May 26, 1981  Today's Date: 03/12/2017 PT Individual Time: 0800-0900 PT Individual Time Calculation (min): 60 min   Short Term Goals: Week 3:  PT Short Term Goal 1 (Week 3): Pt will demonstrate bed mobility modI PT Short Term Goal 2 (Week 3): Pt will demonstrate transfers min guard PT Short Term Goal 3 (Week 3): Pt will ambulate x150' consistent modA PT Short Term Goal 4 (Week 3): Pt will ascend/descend 12 stairs 6-inch height minA Week 4:     Skilled Therapeutic Interventions/Progress Updates:    Pt received supine in bed and agreeable to PT. Pt noted to be urinating supine in bed in urinal. once finished, Supine>sit transfer without assist using heavy UE support on bed rails.   Pt sat EOB for RN to assess Blood glucose, pt became agitated and then refused oral hygiene required to eat. Once NT exited room, pt became less agitated and agreeable to continue therapy.   Gait through hall for 163f x 2 with supervision-min assist with occasional LOB to the R requiring assist from PT to prevent fall. Dynamic gait training across agility ladder. 1 foot in each space x 2, 2 feet in each space (leading with the RLE) x 4, side stepping with 2 feet in each x 2 R and L each. Min-mod assist from PT throughout due to poor R LE clearance.   Stair training x 20steps step to gait pattern progressing to step over step with ascent. Step to gait pattern leading with the RLE progressing to leading with the LLE for strengthening the RLE. Min assist initially progressing to close supervision assist.    Pt returned to room and performed ambulatory  transfer to bed with supervision assist . Sit>supine completed with out assist from PT and left supine in bed with call bell in reach and all needs met.              Therapy Documentation Precautions:  Precautions Precautions: Fall Precaution Comments:  left flap, no bone graft; Helmet when OOB; R lean in sitting without LUE support Restrictions Weight Bearing Restrictions: No Pain: Faces: none.     See Function Navigator for Current Functional Status.   Therapy/Group: Individual Therapy  ALorie Phenix4/26/2018, 9:55 AM

## 2017-03-12 NOTE — Progress Notes (Signed)
Pt missed 45 minutes SLP Cancellation Note  Patient Details Name: Arthur Beasley MRN: [id number] DOB: 1981/10/24   Cancelled treatment:        Pt missed 45 minutes os ST d/t pt unavailable d/t surgery.   Rexine Gowens B. Rutherford Nail, M.S., [place] 03/12/2017, 3:13 PM

## 2017-03-13 ENCOUNTER — Inpatient Hospital Stay (HOSPITAL_COMMUNITY): Payer: Medicaid Other | Admitting: Occupational Therapy

## 2017-03-13 ENCOUNTER — Inpatient Hospital Stay (HOSPITAL_COMMUNITY): Payer: Medicaid Other | Admitting: Speech Pathology

## 2017-03-13 ENCOUNTER — Encounter (HOSPITAL_COMMUNITY): Payer: Self-pay | Admitting: Plastic Surgery

## 2017-03-13 ENCOUNTER — Inpatient Hospital Stay (HOSPITAL_COMMUNITY): Payer: Medicaid Other | Admitting: Physical Therapy

## 2017-03-13 LAB — GLUCOSE, CAPILLARY: Glucose-Capillary: 119 mg/dL — ABNORMAL HIGH (ref 65–99)

## 2017-03-13 MED ORDER — IBUPROFEN 800 MG PO TABS
800.0000 mg | ORAL_TABLET | Freq: Four times a day (QID) | ORAL | Status: DC | PRN
Start: 2017-03-13 — End: 2017-03-17
  Filled 2017-03-13 (×2): qty 1

## 2017-03-13 MED ORDER — FREE WATER
50.0000 mL | Freq: Two times a day (BID) | Status: DC
  Administered 2017-03-13 – 2017-03-15 (×4): 50 mL

## 2017-03-13 NOTE — Progress Notes (Signed)
Speech Language Pathology Weekly Progress and Session Note  Patient Details  Name: Arthur Beasley MRN: [id number] Date of Birth: [birth year]  Beginning of progress report period: March 06, 2017 End of progress report period: March 13, 2017  Today's Date: 03/13/2017 SLP Individual Time: 5681-2751 SLP Individual Time Calculation (min): 60 min  Short Term Goals: Week 3: SLP Short Term Goal 1 (Week 3): Pt will complete basic, familiar problem solving tasks related to ADLs with Min A cues.  SLP Short Term Goal 1 - Progress (Week 3): Met SLP Short Term Goal 2 (Week 3): Pt will imitate bilabial and vowel sounds with verbal and visual model in 75% of opportunities.  SLP Short Term Goal 2 - Progress (Week 3): Not met SLP Short Term Goal 3 (Week 3): Pt will use multimodal communication to communicate wants and needs in 50% of opportunities with Mod A multimodal cues.  SLP Short Term Goal 3 - Progress (Week 3): Not met SLP Short Term Goal 4 (Week 3): Pt will follow 2 step simple directions with 75% accuracy and Mod A cues.  SLP Short Term Goal 4 - Progress (Week 3): Not met SLP Short Term Goal 5 (Week 3): Pt will consume thin liquids without overt s/s of aspiration to demonstrate readiness for further instrumental study/diet advancement.  SLP Short Term Goal 5 - Progress (Week 3): Met SLP Short Term Goal 6 (Week 3): Pt will consume thin liquids without overt s/s of aspiration to demonstrate readiness for further instrumental study/diet advancement.  SLP Short Term Goal 6 - Progress (Week 3): Other (comment) (duplicate goals incorrectly entered)    New Short Term Goals: Week 4: SLP Short Term Goal 1 (Week 4): Pt will complete basic, familiar problem solving tasks related to ADLs with supervision cues.  SLP Short Term Goal 2 (Week 4): Pt will imitate bilabial and vowel sounds with verbal and visual model in 75% of opportunities.  SLP Short Term Goal 3 (Week 4): Pt will use multimodal  communication to communicate wants and needs in 50% of opportunities with Mod A multimodal cues.  SLP Short Term Goal 4 (Week 4): Pt will follow 2 step simple directions with 75% accuracy and Mod A cues.  SLP Short Term Goal 5 (Week 4): Pt will consume dysphagia 3 with thin liquids and supervision cues for use of compensatory swallow strategies.   Weekly Progress Updates: Pt continues to make progress in ST sessions but his progress towards goals has slowed slightly. He has met 2 of 5 STGs and his trach was capped this day with wires removed from jaw on 4/26. Pt is consuming dysphagia 2 with thin liquids and medicines crushed in puree without overt s/s of aspiration. 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 Skilled St is required to continue targeting dysphagia and communication goals to increase functional independence and reduce caregiver burden prior to discharge.      Intensity: Minumum of 1-2 x/day, 30 to 90 minutes Frequency: 3 to 5 out of 7 days Duration/Length of Stay: 5/4 Treatment/Interventions: Cognitive remediation/compensation;Cueing hierarchy;Environmental controls;Functional tasks;Internal/external aids;Medication managment;Multimodal communication approach;Speech/Language facilitation;Therapeutic Activities;Dysphagia/aspiration precaution training;Oral motor exercises;Patient/family education   Daily Session  Skilled Therapeutic Interventions: Skilled treatment session focused on dysphagia and communication goals. SLP facilitated session by providing skilled observation of pt consuming extensive trials of dysphagia 2 and dysphagia 3 textures. Pt consumed without overt s/s of aspiration. Recommend upgrading to dysphagia 2 with thin liquids and medicine crushed in puree  with full nursing supervision. Pt unable to produce tongue movements on command (such as licking lips) and was able to imitate one sequence of consonant  vowel (ma) with Max A cues. Pt able to sing with ~ 75% intelligibility to favorite song. Pt was returned to room, left in bed, bed alarm on and all needs within reach. Continue per current plan of care.      Function:   Eating Eating   Modified Consistency Diet: Yes Eating Assist Level: Set up assist for;Supervision or verbal cues;Helper checks for pocketed food   Eating Set Up Assist For: Opening containers       Cognition Comprehension Comprehension assist level: Understands basic 75 - 89% of the time/ requires cueing 10 - 24% of the time  Expression Expression assistive device: Talk trach valve Expression assist level: Expresses basic 50 - 74% of the time/requires cueing 25 - 49% of the time. Needs to repeat parts of sentences.  Social Interaction Social Interaction assist level: Interacts appropriately 75 - 89% of the time - Needs redirection for appropriate language or to initiate interaction.  Problem Solving Problem solving assist level: Solves basic 75 - 89% of the time/requires cueing 10 - 24% of the time  Memory Memory assist level: Recognizes or recalls 90% of the time/requires cueing < 10% of the time   General    Pain    Therapy/Group: Individual Therapy   Arthur Beasley B. Arthur Beasley, M.S., CCC-SLP Speech-Language Pathologist   Arthur Beasley 03/13/2017, 4:33 PM

## 2017-03-13 NOTE — Progress Notes (Signed)
Dr. Naaman Plummer capped the Pt on 03/13/17. RT will continue to monitor

## 2017-03-13 NOTE — Plan of Care (Signed)
Problem: RH PAIN MANAGEMENT Goal: RH STG PAIN MANAGED AT OR BELOW PT'S PAIN GOAL Outcome: Progressing On schedule Ibuprofen, denied pain

## 2017-03-13 NOTE — Progress Notes (Signed)
Occupational Therapy Session Note  Patient Details  Name: Arthur Beasley MRN: [id number] Date of Birth: November 17, 1981  Today's Date: 03/13/2017 OT Individual Time: 1131-1200 OT Individual Time Calculation (min): 29 min    Short Term Goals: Week 3:  OT Short Term Goal 1 (Week 3): Pt will perform bathing tasks with mod A sit<>stand from seat. OT Short Term Goal 2 (Week 3): Pt will thread BLE into pants with min A while seated in w/c OT Short Term Goal 3 (Week 3): Pt will thread B socks with min A OT Short Term Goal 4 (Week 3): Pt will perform toileting tasks with steady A  Skilled Therapeutic Interventions/Progress Updates:    Pt worked on Brewing technologist for the RUE during the session.  Pt transitioned to the EOB, needing mod assist for fastening soft helmet.  He was able to ambulate to the therapy gym with min guard assist.  Worked on RUE weightbearing in sitting to start with having pt reach across his body with the LUE while supporting himself with the RUE.  Max assist for elbow stabilization in the RUE while reaching.  Progressed to active movement in the RUE using tilted stool to target.  He was able to complete small movements of elbow extension with min assist.  Pt able to maintain sustained attention to task for 15 mins in mod distracting environment with less than min instructional cueing.  Once finished, had pt transfer back to the room with min guard assist.  Pt placed in bed at end of session with call button in place and bed alarm in place.    Therapy Documentation Precautions:  Precautions Precautions: Fall Precaution Comments: left flap, no bone graft; Helmet when OOB Restrictions Weight Bearing Restrictions: No  Pain: Pain Assessment Pain Assessment: No/denies pain ADL: ADL ADL Comments: refer to functional navigator  See Function Navigator for Current Functional Status.   Therapy/Group: Individual Therapy  Ceilidh Torregrossa OTR/L 03/13/2017, 12:42 PM

## 2017-03-13 NOTE — Progress Notes (Signed)
Physical Therapy Session Note  Patient Details  Name: Arthur Beasley MRN: [id number] Date of Birth: 22-Apr-1981  Today's Date: 03/13/2017 PT Individual Time: 1000-1058 PT Individual Time Calculation (min): 58 min   Short Term Goals: Week 3:  PT Short Term Goal 1 (Week 3): Pt will demonstrate bed mobility modI PT Short Term Goal 2 (Week 3): Pt will demonstrate transfers min guard PT Short Term Goal 3 (Week 3): Pt will ambulate x150' consistent modA PT Short Term Goal 4 (Week 3): Pt will ascend/descend 12 stairs 6-inch height minA  Skilled Therapeutic Interventions/Progress Updates:   Pt received supine in bed and agreeable to PT. Supine>sit transfer without assist or cues   Gait in room to bathroom with supervision assist PT attempted to instructed pt in shower while sitting in shower chair. Pt refused to let PT assist with bathing tasks and became very agitated if PT attempted to pull back curtain to assist PT.   Dressing assist by PT including donnig pants and shirt with min assist for sequecing of RUE.  Gait through hall 299f, 1253f [id number] overall supervision assist. Gait over uneven surface including ramp and mulch with min assist steppage gait pattern on ramp and uneven surface. Intermittent scissoring gait on level surface with 2 near LOB requiring min assist to prevent fall.  Dynamic balance for alternating foot taps on 6 inch step and 3 inch step. Lateral foot taps on 3 inch step. Min assist from PT throughout and 1 UE support at 6 inch step progressing to no UE at 3 inch step.   Pt returned to room and performed amulatory transfer to bed with supervision assist. Sit>supine completed without assist and pt left supine in bed with call bell in reach and all needs met.        Therapy Documentation Precautions:  Precautions Precautions: Fall Precaution Comments: left flap, no bone graft; Helmet when OOB; R lean in sitting without LUE support Restrictions Weight Bearing  Restrictions: No Vital Signs: Therapy Vitals Pulse Rate: 87 Resp: 17 Patient Position (if appropriate): Lying Oxygen Therapy SpO2: 99 % O2 Device: Not Delivered   See Function Navigator for Current Functional Status.   Therapy/Group: Individual Therapy  AusLorie Phenix27/2018, 10:58 AM

## 2017-03-13 NOTE — Progress Notes (Signed)
Occupational Therapy Weekly Progress Note  Patient Details  Name: Arthur Beasley MRN: [id number] Date of Birth: [birth year]  Beginning of progress report period: March 06, 2017 End of progress report period: March 13, 2017  Today's Date: 03/13/2017 OT Individual Time: 1300-1346 OT Individual Time Calculation (min): 46 min    Patient has met 3 of 4 short term goals.  Mr. Weldy continues to need min assist overall for most selfcare tasks when completed.  He still requires min assistance for donning pullover shirt following hemi-techniques, with decreased carryover from session to session.  Min guard assist for LB bathing and dressing sit to stand as well. Brunnstrum stage II movement is noted in the right arm with stage I in the hand.  He has been tolerating electrical stimulation to the digit flexors and extensors without any adverse reactions.  Global aphasia is still present as well with variable participation in therapy sessions at times.  Decreased safety awareness is noted with pt becoming agitated if therapist guards him during transfers, even though his balance issues require this.  Current plan is for discharge home with his significant other on 5/4.  Recommend continued OT with integration of family education and continued work toward established supervision to min assist level goals.   Patient continues to demonstrate the following deficits: muscle weakness, impaired timing and sequencing, decreased coordination and decreased motor planning, decreased initiation, decreased awareness, decreased safety awareness and decreased memory and decreased standing balance, hemiplegia and decreased balance strategies and therefore will continue to benefit from skilled OT intervention to enhance overall performance with BADL and Reduce care partner burden.  Patient progressing toward long term goals..  Continue plan of care.  OT Short Term Goals Week 4:  OT Short Term Goal 1 (Week 4): Continue  working on [facility] as supervison to min assist level.  Skilled Therapeutic Interventions/Progress Updates:    Pt worked on Teacher, music and completion of washing hands to start session, as he was using the urinal when this therapist entered the room.  Min instructional cueing to sequence steps for washing his hands, with pt then ambulating to the therapy gym with min guard assist.  Once in the gym therapist applied NMES to digit flexors and extensors of the right hand.  Pt tolerated Exercise Mode for 7 mins with progression to Grasp Mode for 10 mins while incorporating functional reach to pick up and place cups on plinth positioned at knee level.  Max hand over hand assistance needed to initiate and complete shoulder flexion and elbow extension during functional reach.  No adverse reactions to stimulation noted.  Pt ambulated back to room at end of treatment.  Noted increased scissoring of gait with mobility as well as decreased clearing of the RLE, with shuffling noted.  Pt completed session with pt in bed and bed alarm in place.  Call button and phone in reach.    Therapy Documentation Precautions:  Precautions Precautions: Fall Precaution Comments: left flap, no bone graft; Helmet when OOB Restrictions Weight Bearing Restrictions: No  Pain: Pain Assessment Pain Assessment: No/denies pain ADL: ADL ADL Comments: refer to functional navigator  See Function Navigator for Current Functional Status.   Therapy/Group: Individual Therapy  Zale Marcotte OTR/L 03/13/2017, 5:25 PM

## 2017-03-13 NOTE — Progress Notes (Signed)
Pt was capped on 03/13/17
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
## 2017-03-14 ENCOUNTER — Inpatient Hospital Stay (HOSPITAL_COMMUNITY): Payer: Medicaid Other | Admitting: Physical Therapy

## 2017-03-14 NOTE — Progress Notes (Signed)
Patient has refused to be weighed and refused afternoon vital signs.
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
## 2017-03-14 NOTE — Progress Notes (Signed)
Physical Therapy Session Note  Patient Details  Name: Arthur Beasley MRN: [id number] Date of Birth: 24-Nov-1980  Today's Date: 03/14/2017 PT Individual Time: 0900-1000 PT Individual Time Calculation (min): 60 min   Short Term Goals: Week 1:  PT Short Term Goal 1 (Week 1): Pt will perform bed mobility with mod assist  PT Short Term Goal 1 - Progress (Week 1): Met PT Short Term Goal 2 (Week 1): Pt will perform bed<>chair transfer with mod assist  PT Short Term Goal 2 - Progress (Week 1): Met PT Short Term Goal 3 (Week 1): Pt will ambulate >44f with max assist and LRAD PT Short Term Goal 3 - Progress (Week 1): Met PT Short Term Goal 4 (Week 1): Pt will propel WC 1074fwith mod assist.  PT Short Term Goal 4 - Progress (Week 1): Met PT Short Term Goal 5 (Week 1): Pt will maintain dynamic sitting balance with Mod assist while performing reaching tasks.   PT Short Term Goal 5 - Progress (Week 1): Met  Skilled Therapeutic Interventions/Progress Updates:  Pt was seen bedside in the am. Pt transferred supine to edge of bed with no assist and increased time. Pt tolerated edge of bed with S. Pt transferred sit to stand multiple times throughout treatment with S. Pt performed stand pivot transfers with min guard and verbal cues. Pt ambulated 125 and 300 feet with c/s to min guard and verbal cues. In gym treatment focused on NMR utilizing step taps and alternating step taps 3 sets x 10 reps each. Pt rode Nu step at level 2 for 5, 2, 2, and 1 minutes to increase lower extremity endurance. Pt returned to room. Pt transferred edge of bed to supine with no assist and increased time. Pt left sitting up in bed with call bell within reach.   Therapy Documentation Precautions:  Precautions Precautions: Fall Precaution Comments: left flap, no bone graft; Helmet when OOB Restrictions Weight Bearing Restrictions: No General:   Pain: No c/o pain.   See Function Navigator for Current Functional  Status.   Therapy/Group: Individual Therapy  MiDub Amis/28/2018, 12:43 PM

## 2017-03-15 ENCOUNTER — Inpatient Hospital Stay (HOSPITAL_COMMUNITY): Payer: Medicaid Other

## 2017-03-15 NOTE — Progress Notes (Signed)
Patient has refused to allow staff to take his vital signs again today.

## 2017-03-15 NOTE — Progress Notes (Signed)
Occupational Therapy Session Note  Patient Details  Name: Arthur Beasley MRN: [id number] Date of Birth: September 11, 1981  Today's Date: 03/15/2017 OT Individual Time: 0800-0900 OT Individual Time Calculation (min): 60 min   Short Term Goals: Week 4:  OT Short Term Goal 1 (Week 4): Continue working on [facility] as supervison to min assist level.  Skilled Therapeutic Interventions/Progress Updates: ADL-retraining (50 min) at shower level with focus on improved safety awareness, adapted dressing skills, RUE management.   Pt received supine in bed, alert and able to rise to EOB unassisted with supervision for safety d/t hx of impulsivity.  Pt donned helmet and ambulated to bathroom to shower with contact guard although resisting contact as being unnecessary.  Pt expressed mild agitation but this resolved with OT clarification of role of OT to protect pt from fall.   Pt required only setup assist to bathe and gather clothing with extra time required to interpret hand gestures from pt.   Pt dressed lower body seated on tub bench and ambulated to EOB to dress upper body.   With extra time and demonstration using self (OT, highly animated for emphasis), pt donned tee-shirt using hemi technique to dress right arm first.  Reinforcement recommended d/t memory deficits.     Pt deferred grooming in favor of continued NMES using [employer].   Intensity reduced to 8/10 for finger extension/flexion.   Pt demo'd good carry-over of assisted finger extension during 10 min of attended NMES.  Pt recovered to bed at end of session with all needs placed within reach.     Therapy Documentation Precautions:  Precautions Precautions: Fall Precaution Comments: left flap, no bone graft; Helmet when OOB Restrictions Weight Bearing Restrictions: No   Vital Signs: Therapy Vitals Temp:  (pt refused vitals) Pulse Rate: 99 Resp: 18 BP: 129/82 Oxygen Therapy SpO2: 99 % O2 Device: Not Delivered   Pain: Pain  Assessment Pain Assessment: (P) No/denies pain   ADL: ADL ADL Comments: refer to functional navigator   See Function Navigator for Current Functional Status.   Therapy/Group: Individual Therapy  Nayana Lenig 03/15/2017, 9:13 AM
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
## 2017-03-16 ENCOUNTER — Inpatient Hospital Stay (HOSPITAL_COMMUNITY): Payer: Medicaid Other

## 2017-03-16 ENCOUNTER — Inpatient Hospital Stay (HOSPITAL_COMMUNITY): Payer: Medicaid Other | Admitting: Speech Pathology

## 2017-03-16 ENCOUNTER — Inpatient Hospital Stay (HOSPITAL_COMMUNITY): Payer: Medicaid Other | Admitting: Physical Therapy

## 2017-03-16 LAB — BASIC METABOLIC PANEL WITH GFR
Anion gap: 9 (ref 5–15)
BUN: 8 mg/dL (ref 6–20)
CO2: 26 mmol/L (ref 22–32)
Calcium: 9.6 mg/dL (ref 8.9–10.3)
Chloride: 102 mmol/L (ref 101–111)
Creatinine, Ser: 0.79 mg/dL (ref 0.61–1.24)
GFR calc Af Amer: >60 mL/min
GFR calc non Af Amer: >60 mL/min
Glucose, Bld: 103 mg/dL — ABNORMAL HIGH (ref 65–99)
Potassium: 4 mmol/L (ref 3.5–5.1)
Sodium: 137 mmol/L (ref 135–145)

## 2017-03-16 MED ORDER — ENSURE ENLIVE PO LIQD
237.0000 mL | Freq: Three times a day (TID) | ORAL | Status: DC
  Administered 2017-03-17 (×3): 237 mL via ORAL

## 2017-03-16 NOTE — Progress Notes (Signed)
Social Work Patient ID: Arthur Beasley, male   DOB: [birth year], 36 y.o.   MRN: [id number]   Pt's girlfriend, Carolin Sicks, in this afternoon and talking with pt about d/c.  She explains to him that she has to take care of their children and cannot provide 24/7 assistance for him at home.  She is also concerned about a risk of fall on the stairs.  Pt initially stares at her and looks very angry.  GF tries to explain that his choice is either home with sister, [name] or he goes to SNF.  After a lot of discussion and explaining, pt reluctantly agrees to d/c home with sister for a month.  We also discussed need to have follow up txs and that he remain compliant with trach site care, meds and therapy.  He nods "yes" to this. Will continue to monitor behavior until his d/c Friday.  Still need to coordinate education with sister.  Damieon Armendariz, LCSW
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
## 2017-03-16 NOTE — Progress Notes (Signed)
Patient's sister came by to visit and seemed to agitate the patient. Patient requested she leave and at such time Lennart Pall, SW and this RN continued educating patient on the importance of taking his medications. He was agreeable to take his Norvasc, Plavix, and Aspirin. RN gave medications crushed in chocolate pudding.

## 2017-03-16 NOTE — Progress Notes (Signed)
Physical Therapy Session Note  Patient Details  Name: Arthur Beasley MRN: [id number] Date of Birth: [birth year]  Today's Date: 03/16/2017 PT Individual Time: 1402-1526 PT Individual Time Calculation (min): 84 min   Short Term Goals: Week 4:     Skilled Therapeutic Interventions/Progress Updates:    Pt in bed upon arrival, agreeable to PT session. Bed Mobility: mod I supine<>sitting with HOB elevated. Transfers: supervision for safety. Ambulation: pt ambulating 500 ft without device, and close supervision. Multiple distances performed throughout session, pt able to pathfind to<>from gym. Included ambulation on carpeted surfaces. Pt with 3 LOB with min assist to recover. Decreased stability and foot clearance with fatigue. On uneven surface, pt needing min assist due to difficulty with Rt LE catching on surface. Balance: Sitting/standing balance activities performed including ball catch/toss sitting with static/dynamic target, standing disc toss to target, basketball toss "around the world". Cognitive tasks incorporated including counting of miss/makes, sorting numbered discs, and numbered toss locations. Pt taking multiple seated rest breaks throughout session and pt requesting 1 seated break in room. Following session pt returned to bed with all needs in reach. Bed alarm on.   Therapy Documentation Precautions:  Precautions Precautions: Fall Precaution Comments: left flap, no bone graft; Helmet when OOB Restrictions Weight Bearing Restrictions: No Pain: Denies pain.  See Function Navigator for Current Functional Status.   Therapy/Group: Individual Therapy  Linard Millers, PT 03/16/2017, 3:55 PM

## 2017-03-16 NOTE — Progress Notes (Signed)
Decanulated pt.  Pt tolerated well.  Pt SPO2 on RA 100%.  RN at bedside.  Placed gauze dressing with paper tape.

## 2017-03-16 NOTE — Progress Notes (Signed)
Patient refused all 0800 medications except Keppra dose. RN explained to pt. importance of all medications and pt. Still refused. Medications returned to pyxis.

## 2017-03-16 NOTE — Progress Notes (Signed)
Occupational Therapy Session Note  Patient Details  Name: Arthur Beasley MRN: [id number] Date of Birth: 1981/09/08  Today's Date: 03/16/2017 OT Individual Time:  -       Short Term Goals: Week 4:  OT Short Term Goal 1 (Week 4): Continue working on [facility] as supervison to min assist level.  Skilled Therapeutic Interventions/Progress Updates:    Pt resting in bed upon arrival with phone to ear.  Pt attempted to unsuccessfully speak to person on phone.  Pt became frustrated and finally hung up without saying anything.  Pt agreeable to bathing at shower level and dressing with sit<>stand but adamant that no assistance be provided.  Pt resistant to all attempts at education for hemi bathing/dressing techniques and safety awareness.  Pt completes all bathing tasks at supervision level but refuses to allow assistance to bathe RUE.  Pt refuses to employ hemi dressing strategies but is able to complete dressing tasks at supervision level. Pt attempted to remove socks while standing and experienced LOB X 1 requiring assistance for balance.  Pt returned to bed and remained in bed with bed alarm activated and all needs within reach.   Therapy Documentation Precautions:  Precautions Precautions: Fall Precaution Comments: left flap, no bone graft; Helmet when OOB Restrictions Weight Bearing Restrictions: No Pain:  pt denied pain  See Function Navigator for Current Functional Status.   Therapy/Group: Individual Therapy  Leroy Libman 03/16/2017, 9:55 AM

## 2017-03-16 NOTE — Progress Notes (Signed)
SLP Cancellation Note  Patient Details Name: Arthur Beasley MRN: [id number] DOB: 1981/03/13   Cancelled treatment:        Pt missed 45 minutes of session d/t refusal. SLP received pt after CSW meeting with pt and he appeared frustrated.                                                                                              Jeneva Schweizer B. Rutherford Nail, M.S., CCC-SLP Speech-Language Pathologist    Dannya Pitkin 03/16/2017, 11:28 AM
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
## 2017-03-16 NOTE — Progress Notes (Signed)
End of shift summary,  Pt is refusing all vital signs, does not want PEG tube flushed, refused urocholine last night as well.  Pt had refused keppra, however, girlfriend was in the room and she was able to coax him to take it.

## 2017-03-16 NOTE — Progress Notes (Signed)
Social Work Patient ID: Arthur Beasley, male   DOB: 1981-10-14, 36 y.o.   MRN: [id number]   Met with pt this morning to discuss d/c plan as he has been indicating that he does not want to d/c home with sister, Arthur Beasley.  I explained that Arthur Beasley has been our d/c plan caregiver since his admit as he will need 24/7 support and she is able to provide this.  He quickly begins shaking his head "no".  He states that he wants to d/c home with his girlfriend and children.  He reports that gf does not work and is able to provide 24/7 assistance.  I told pt that I need to discuss his wishes with his sister, Arthur Beasley and he is agreeable.   Spoke with Arthur Beasley who is aware of pt desire to d/c home with girlfriend.  She notes her concerns about that home environment is "She's (gf) not a clean person...she has stairs...she fusses all the time and puts him out...".  Explained that I completely understand her concerns, however, pt wanting to d/c there anyway.  Sister states that she "knew he would go back there but just wanted him to stay with me for a little while...".  Explained that we need to prepare/ educate any potential caregivers this week and, if there is a possibility that pt will d/c to gf's home at some point then we need to educate her as well.  I requested that sister discuss the situation with the rest of the family and that they then talk with pt and come up with a solid d/c plan.  Sister is agreeable with this and is making phone calls today.  She is also going to contact pt's girlfriend to see if she will support plan to go to sister's home for short term.  Sister to follow up with me by mid day today.  Harue Pribble, LCSW

## 2017-03-17 ENCOUNTER — Inpatient Hospital Stay (HOSPITAL_COMMUNITY): Payer: Medicaid Other | Admitting: Occupational Therapy

## 2017-03-17 ENCOUNTER — Inpatient Hospital Stay (HOSPITAL_COMMUNITY): Payer: Medicaid Other

## 2017-03-17 ENCOUNTER — Inpatient Hospital Stay (HOSPITAL_COMMUNITY): Payer: Medicaid Other | Admitting: Speech Pathology

## 2017-03-17 ENCOUNTER — Inpatient Hospital Stay (HOSPITAL_COMMUNITY): Payer: Medicaid Other | Admitting: Physical Therapy

## 2017-03-17 MED ORDER — ASPIRIN 81 MG PO CHEW
81.0000 mg | CHEWABLE_TABLET | Freq: Every day | ORAL | Status: DC
  Administered 2017-03-17 – 2017-03-19 (×3): 81 mg via ORAL
  Filled 2017-03-17 (×3): qty 1

## 2017-03-17 MED ORDER — ADULT MULTIVITAMIN LIQUID CH
15.0000 mL | Freq: Every day | ORAL | Status: DC
  Filled 2017-03-17: qty 15

## 2017-03-17 MED ORDER — PANTOPRAZOLE SODIUM 40 MG PO TBEC: 40.0000 mg | DELAYED_RELEASE_TABLET | Freq: Every day | ORAL | Status: DC

## 2017-03-17 MED ORDER — CLOPIDOGREL BISULFATE 75 MG PO TABS
75.0000 mg | ORAL_TABLET | Freq: Every day | ORAL | Status: DC
  Administered 2017-03-17 – 2017-03-19 (×3): 75 mg via ORAL
  Filled 2017-03-17 (×3): qty 1

## 2017-03-17 MED ORDER — LEVETIRACETAM 500 MG PO TABS
500.0000 mg | ORAL_TABLET | Freq: Two times a day (BID) | ORAL | Status: DC
  Administered 2017-03-17 – 2017-03-19 (×5): 500 mg via ORAL
  Filled 2017-03-17 (×5): qty 1

## 2017-03-17 MED ORDER — HYDROCODONE-ACETAMINOPHEN 7.5-325 MG PO TABS: 1.0000 | ORAL_TABLET | Freq: Four times a day (QID) | ORAL | Status: DC | PRN

## 2017-03-17 NOTE — Progress Notes (Signed)
Occupational Therapy Session Note  Patient Details  Name: KAYLE CORREA MRN: [id number] Date of Birth: 12-16-1980  Today's Date: 03/17/2017 OT Individual Time: 9977-4142 OT Individual Time Calculation (min): 57 min    Short Term Goals: Week 4:  OT Short Term Goal 1 (Week 4): Continue working on [facility] as supervison to min assist level.  Skilled Therapeutic Interventions/Progress Updates:    Pt resting in bed upon arrival and agreeable to engaging in BADL retraining.  Pt amb to bathroom and doffed clothing prior to transferring to shower.  Pt initially attempted to doff socks while standing but complied with recommendation to sit on seat to remove his socks.  Pt continues to insist on privacy with all bathing and dressing tasks.  Pt incorporated hemi dressing techniques for UB abd LB dressing tasks.  Pt returned to room and assisted with changing bed linens before returning to bed.  Pt continues to be insistent on "doing things" his way and is resistant to most recommendations/suggestions.  Pt remained in bed with bed alarm activated and all needs within reach.   Therapy Documentation Precautions:  Precautions Precautions: Fall Precaution Comments: left flap, no bone graft; Helmet when OOB Restrictions Weight Bearing Restrictions: No  Pain:  Pt denied pain  See Function Navigator for Current Functional Status.   Therapy/Group: Individual Therapy  Leroy Libman 03/17/2017, 10:29 AM
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
## 2017-03-17 NOTE — Progress Notes (Signed)
Occupational Therapy Note  Patient Details  Name: Arthur Beasley MRN: [id number] Date of Birth: 1980/12/17  Pt missed 30 mins OT secondary to refusal to participate.  Pt no acknowledging therapist in the room and just layed in bed with eyes closed.  When therapist finally got him to open his eyes he would shake his head no and then just close his eyes back.    Allure Greaser OTR/L 03/17/2017, 1:58 PM

## 2017-03-17 NOTE — Progress Notes (Signed)
Speech Language Pathology Daily Session Note  Patient Details  Name: Arthur Beasley MRN: [id number] Date of Birth: 1981/03/25  Today's Date: 03/17/2017 SLP Individual Time: 0830-0900 SLP Individual Time Calculation (min): 30 min  Short Term Goals: Week 4: SLP Short Term Goal 1 (Week 4): Pt will complete basic, familiar problem solving tasks related to ADLs with supervision cues.  SLP Short Term Goal 2 (Week 4): Pt will imitate bilabial and vowel sounds with verbal and visual model in 75% of opportunities.  SLP Short Term Goal 3 (Week 4): Pt will use multimodal communication to communicate wants and needs in 50% of opportunities with Mod A multimodal cues.  SLP Short Term Goal 4 (Week 4): Pt will follow 2 step simple directions with 75% accuracy and Mod A cues.  SLP Short Term Goal 5 (Week 4): Pt will consume dysphagia 3 with thin liquids and supervision cues for use of compensatory swallow strategies.   Skilled Therapeutic Interventions: Skilled treatment session focused on addressing speech-language and communication goals. Upon SLP arrival patient was gesturing toward the bathroom door and required Max assist multimodal cues to provide more information for needs and wants.  Yes/no questions were ~80% accurate and effective at identifying need in the moment.  Patient indicating that he wanted a shower; as a result, daily schedule was reviewed and patient ok with waiting until occupational therapy session.  SLP then facilitated session by providing Max assist encouragement for patient to attempt verbal tasks such as singing, counting, etc. Patient with intermittent vocalization attempts with some bilabial approximations throughout tasks.  Continue with current plan of care.    Function:  Cognition Comprehension Comprehension assist level: Understands basic 75 - 89% of the time/ requires cueing 10 - 24% of the time  Expression Expression assistive device:  (decannulated) Expression assist  level: Expresses basis less than 25% of the time/requires cueing >75% of the time.  Social Interaction Social Interaction assist level: Interacts appropriately 50 - 74% of the time - May be physically or verbally inappropriate.  Problem Solving Problem solving assist level: Solves basic 25 - 49% of the time - needs direction more than half the time to initiate, plan or complete simple activities  Memory Memory assist level: Recognizes or recalls 50 - 74% of the time/requires cueing 25 - 49% of the time    Pain Pain Assessment Pain Assessment: No/denies pain  Therapy/Group: Individual Therapy  Carmelia Roller., Inger  Edgar 03/17/2017, 12:33 PM

## 2017-03-17 NOTE — Progress Notes (Signed)
Physical Therapy Note  Patient Details  Name: Arthur Beasley MRN: [id number] Date of Birth: 1980/11/18 Today's Date: 03/17/2017    Pt refusing to participate with PT, no reason provided. Checking with pt X3 attempts with encouragement provided, each time pt refusing. Pt in bed with alarm on and all needs in reach.    Linard Millers, PT 03/17/2017, 11:29 AM

## 2017-03-18 ENCOUNTER — Inpatient Hospital Stay (HOSPITAL_COMMUNITY): Payer: Medicaid Other | Admitting: Occupational Therapy

## 2017-03-18 ENCOUNTER — Ambulatory Visit (HOSPITAL_COMMUNITY): Payer: Medicaid Other | Admitting: Physical Therapy

## 2017-03-18 ENCOUNTER — Inpatient Hospital Stay (HOSPITAL_COMMUNITY): Payer: Medicaid Other | Admitting: Speech Pathology

## 2017-03-18 ENCOUNTER — Inpatient Hospital Stay (HOSPITAL_COMMUNITY): Payer: Medicaid Other

## 2017-03-18 NOTE — Progress Notes (Signed)
Physical Therapy Discharge Summary  Patient Details  Name: Arthur Beasley MRN: [id number] Date of Birth: May 29, 1981  Today's Date: 03/18/2017 PT Individual Time: 1027-2536 PT Individual Time Calculation (min): 54 min   Patient has met 11 of 11 long term goals due to improved activity tolerance, improved balance, improved postural control, increased strength, increased range of motion, ability to compensate for deficits, functional use of  right lower extremity, improved attention, improved awareness and improved coordination.  Patient to discharge at an ambulatory level Supervision.   Patient's care partner is independent to provide the necessary physical and cognitive assistance at discharge.  Reasons goals not met: All treatment goals met.    Recommendation:  Patient will benefit from ongoing skilled PT services in home health setting to continue to advance safe functional mobility, address ongoing impairments in balance, safety, strength, coordination, and minimize fall risk.  Equipment: No equipment provided  Reasons for discharge: treatment goals met and discharge from hospital  Patient/family agrees with progress made and goals achieved: Yes   PT Treatment: Pt instructed pt in grad day assessment to measure pt progress towards goals. See below for details. PT provided pt and family with Modified [place] level B HEP. Pt demonstrated need for supervision assist for all mobility including transfers, gait over various surfaces, stairs, and car transfers. PT completed family edcucated for sister and brother with instruction for proper guarding techniques with gait, transfers, and stairs. Pt left sitting EOB with CSW present at end of treatment session.   PT Discharge Precautions/Restrictions   Vital Signs Therapy Vitals Temp:  (patient refused all vitals) Pain   Vision/Perception     Cognition Overall Cognitive Status: Impaired/Different from baseline Arousal/Alertness:  Awake/alert Attention: Sustained Sustained Attention: Appears intact Memory: Impaired Memory Impairment: Decreased recall of new information Awareness: Impaired Awareness Impairment: Anticipatory impairment;Emergent impairment Problem Solving Impairment: Functional basic;Functional complex Reasoning: Impaired Reasoning Impairment: Functional basic Decision Making: Impaired Decision Making Impairment: Functional basic Safety/Judgment: Impaired Comments: Pt still with decreased safety awareness as well as emergent and anticipatory awareness.  Resistant to therapist assist or showing him different ways to complete tasks secondary to significant RUE hemiparesis. Sensation Sensation Light Touch: Impaired Detail Light Touch Impaired Details: Impaired RUE Stereognosis: Not tested Hot/Cold: Not tested Proprioception: Impaired Detail Proprioception Impaired Details: Impaired RUE Additional Comments: Pt able to detect some light touch in the RUE but unable to accurately point to the area being touched or nod head consistently when touched. Coordination Gross Motor Movements are Fluid and Coordinated: No Fine Motor Movements are Fluid and Coordinated: No Coordination and Movement Description: Pt currently Brunnstrum stage I in the right hand and stage II with only trace shoulder movements noted.  Needs max hand over hand assist to integrate into functional tasks.   Motor  Motor Motor: Hemiplegia Motor - Discharge Observations: Still with significant RUE hemiparesis and only mild hemiparesis in the RLE.   Mobility Bed Mobility Bed Mobility: Sit to Supine;Supine to Sit Rolling Right: 6: Modified independent (Device/Increase time) Rolling Left: 6: Modified independent (Device/Increase time) Supine to Sit: 6: Modified independent (Device/Increase time) Sit to Supine: 6: Modified independent (Device/Increase time) Transfers Sit to Stand: 5: Supervision;With upper extremity assist;From  toilet Stand to Sit: 5: Supervision;With upper extremity assist;To toilet Squat Pivot Transfers: 5: Supervision Squat Pivot Transfer Details: Verbal cues for precautions/safety Locomotion  Ambulation Ambulation: Yes Ambulation/Gait Assistance: 5: Supervision Ambulation Distance (Feet): 200 Feet Assistive device: None Ambulation/Gait Assistance Details: Verbal cues for gait pattern Gait Gait: Yes  Gait Pattern: Impaired Gait Pattern: Narrow base of support;Right foot flat Stairs / Additional Locomotion Stairs: Yes Stairs Assistance: 5: Supervision;4: Min assist Stairs Assistance Details: Verbal cues for technique;Verbal cues for precautions/safety;Verbal cues for gait pattern Stair Management Technique: No rails Number of Stairs: 12 Height of Stairs: 6 Ramp: 5: Supervision Curb: 5: Supervision Wheelchair Mobility Wheelchair Mobility: No  Trunk/Postural Assessment  Cervical Assessment Cervical Assessment: Within Functional Limits Thoracic Assessment Thoracic Assessment: Within Functional Limits Lumbar Assessment Lumbar Assessment: Within Functional Limits  Balance Balance Balance Assessed: Yes Static Sitting Balance Static Sitting - Balance Support: No upper extremity supported;Feet supported Static Sitting - Level of Assistance: 6: Modified independent (Device/Increase time) Dynamic Sitting Balance Dynamic Sitting - Balance Support: No upper extremity supported;During functional activity Dynamic Sitting - Level of Assistance: 6: Modified independent (Device/Increase time) Static Standing Balance Static Standing - Balance Support: During functional activity Static Standing - Level of Assistance: 5: Stand by assistance Dynamic Standing Balance Dynamic Standing - Balance Support: During functional activity Dynamic Standing - Level of Assistance: 5: Stand by assistance Extremity Assessment      RLE Assessment RLE Assessment: Exceptions to [facility] RLE AROM (degrees) RLE  Overall AROM Comments: decreased knee extension  RLE PROM (degrees) RLE Overall PROM Comments: WFL RLE Strength RLE Overall Strength Comments: grosslt 4-/5 to 4/5 proximal to distal noted through funcitonal movement. pt declined MMT  LLE Assessment LLE Assessment: Within Functional Limits   See Function Navigator for Current Functional Status.  Lorie Phenix 03/19/2017, 5:56 AM

## 2017-03-18 NOTE — Progress Notes (Signed)
Speech Language Pathology Discharge Summary  Patient Details  Name: Arthur Beasley MRN: [id number] Date of Birth: [birth year]  Today's Date: 03/18/2017 SLP Individual Time: 1345-1430 SLP Individual Time Calculation (min): 45 min   Skilled Therapeutic Interventions:  Skilled treatment session focused on cognition goals and completion of education with pt, sister, brother and girlfriend. Education provided on current diet, aphasia, apraxia and cognitive deficits. Pt is currently unable to express his wants and needs verbally or with writing d/t combination of expressive aphasia and apraxia. Additionally, pt doesn't demonstrate intellectual/emergent/anticipatory awareness of skull flap missing and implications of cranial injury. Stressed important of follow-up ST to increased pt's ability to express himself and further comprehend severity of current deficits. Stressed need for 24 hour supervision to prevent further injury and recommend not returning to smoking weed or consuming alcohol.     Patient has met 8 of 8 long term goals.  Patient to discharge at overall Mod level.   Clinical Impression/Discharge Summary:   Pt has made great progress in ST sessions and as a result he has met all of his LTGs. He has been decannulated, able to sustain phonation and consuming dysphagia 2 diet with thin liquids. Pt currently requires Mod A d/t inability to effectively verbally communicate or write his wants and needs d/t expressive aphasia and apraxia. Pt also requires 24 hour supervision d/t decreased awareness of skull flap missing etc. All education completed with sister and all questions answered to pt and family satisfaction. Sister states that she is able to provide Mod A.   Care Partner:  Caregiver Able to Provide Assistance: Yes  Type of Caregiver Assistance: Physical;Cognitive  Recommendation:  Home Health SLP;Outpatient SLP;24 hour supervision/assistance  Rationale for SLP Follow Up: Maximize  functional communication;Maximize cognitive function and independence;Maximize swallowing safety;Reduce caregiver burden   Equipment: N/A   Reasons for discharge: Treatment goals met   Patient/Family Agrees with Progress Made and Goals Achieved: Yes   Function:    Cognition Comprehension Comprehension assist level: Understands basic 75 - 89% of the time/ requires cueing 10 - 24% of the time  Expression   Expression assist level: Expresses basis less than 25% of the time/requires cueing >75% of the time.  Social Interaction Social Interaction assist level: Interacts appropriately 50 - 74% of the time - May be physically or verbally inappropriate.  Problem Solving Problem solving assist level: Solves basic 25 - 49% of the time - needs direction more than half the time to initiate, plan or complete simple activities  Memory Memory assist level: Recognizes or recalls 50 - 74% of the time/requires cueing 25 - 49% of the time    Ivis Nicolson B. Rutherford Nail, M.S., [place] 03/18/2017, 4:10 PM

## 2017-03-18 NOTE — Progress Notes (Signed)
Social Work Patient ID: Arthur Beasley, male   DOB: 10-14-1981, 36 y.o.   MRN: [id number]   Pt's sister and brother in this afternoon to complete family education. They are aware that MD and team feel pt could d/c tomorrow (one day earlier) and are in agreement with this.  Therapists report no concerns with education sessions.  All pleased with progress and looking forward to going home!  Tayley Mudrick, LCSW

## 2017-03-18 NOTE — Progress Notes (Signed)
Occupational Therapy Discharge Summary  Patient Details  Name: Arthur Beasley MRN: [id number] Date of Birth: June 03, 1981  Today's Date: 03/18/2017 OT Individual Time: 1300-1345 OT Individual Time Calculation (min): 45 min   Session Note:  Pt's sister and brother present for family education during session.  Completed shower tub transfers using the tub bench with education on the need to remove the shower doors and put up a curtain as well as the need for a hand held shower.  Had pt also complete toilet transfer from lower toilet with supervision as well, as this is what his sister has in her house that he will be using.  Completed session with education and handout provided on PROM exercises for the RUE including shoulder, elbow, hand, and digits.  Emphasized throughout session the need for pt to have 24 hour supervision, and to be with him with all selfcare and toileting tasks, even though he will resist.  Sister and brother voiced understanding of this.  Pt left with SLP sitting on the EOB to conclude session.      Patient has met 12 of 14 long term goals due to improved balance, postural control, ability to compensate for deficits, functional use of  RIGHT upper and RIGHT lower extremity, improved attention and improved awareness.  Patient to discharge at [facility] Assist level.  Patient's care partner is independent to provide the necessary physical and cognitive assistance at discharge.    Reasons goals not met: Pt needs mod to max assist for awareness and memory  Recommendation:  Patient will benefit from ongoing skilled OT services in home health setting to continue to advance functional skills in the area of BADL.   Feel pt will benefit from continued [place] for follow-up with transition to outpatient OT as soon as possible secondary to right hemiparesis, decreased cognitive processing, decreased safety awareness, and decreased overall independence with basic selfcare tasks.     Equipment: tub bench  Reasons for discharge: treatment goals met and discharge from hospital  Patient/family agrees with progress made and goals achieved: Yes  OT Discharge Precautions/Restrictions  Precautions Precautions: Fall Precaution Comments: left flap, no bone graft; Helmet when OOB Restrictions Weight Bearing Restrictions: No  Pain Pain Assessment Pain Assessment: No/denies pain ADL ADL ADL Comments: refer to functional navigator Vision/Perception  Vision- History Baseline Vision/History: No visual deficits Vision- Assessment Ocular Range of Motion: Within Functional Limits  Cognition Overall Cognitive Status: Impaired/Different from baseline Arousal/Alertness: Awake/alert Orientation Level: Oriented X4 Attention: Sustained Sustained Attention: Appears intact Memory: Impaired Memory Impairment: Decreased recall of new information Awareness: Impaired Awareness Impairment: Anticipatory impairment;Emergent impairment Problem Solving: Impaired Problem Solving Impairment: Functional basic;Functional complex Executive Function: Decision Making;Self Correcting;Organizing;Sequencing;Reasoning Reasoning: Impaired Reasoning Impairment: Functional basic Sequencing: Impaired Sequencing Impairment: Verbal complex;Functional basic Organizing: Impaired Organizing Impairment: Verbal complex;Functional basic Decision Making: Impaired Decision Making Impairment: Functional basic Behaviors: Restless;Impulsive;Physical agitation;Poor frustration tolerance Safety/Judgment: Impaired Comments: Pt still with decreased safety awareness as well as emergent and anticipatory awareness.  Resistant to therapist assist or showing him different ways to complete tasks secondary to significant RUE hemiparesis. Sensation Sensation Light Touch: Impaired Detail Light Touch Impaired Details: Impaired RUE Stereognosis: Not tested Hot/Cold: Not tested Proprioception: Impaired  Detail Proprioception Impaired Details: Impaired RUE Additional Comments: Pt able to detect some light touch in the RUE but unable to accurately point to the area being touched or nod head consistently when touched. Coordination Gross Motor Movements are Fluid and Coordinated: No Fine Motor Movements are Fluid and Coordinated: No  Coordination and Movement Description: Pt currently Brunnstrum stage I in the right hand and stage II with only trace shoulder movements noted.  Needs max hand over hand assist to integrate into functional tasks.   Motor  Motor Motor: Hemiplegia Motor - Discharge Observations: Still with significant RUE hemiparesis and only mild hemiparesis in the RLE.  Mobility  Bed Mobility Bed Mobility: Sit to Supine;Supine to Sit Supine to Sit: 6: Modified independent (Device/Increase time) Sit to Supine: 6: Modified independent (Device/Increase time) Transfers Transfers: Sit to Stand;Stand to Sit Sit to Stand: 5: Supervision;With upper extremity assist;From toilet Stand to Sit: 5: Supervision;With upper extremity assist;To toilet  Trunk/Postural Assessment  Cervical Assessment Cervical Assessment: Within Functional Limits Thoracic Assessment Thoracic Assessment: Within Functional Limits Lumbar Assessment Lumbar Assessment: Within Functional Limits  Balance Balance Balance Assessed: Yes Static Sitting Balance Static Sitting - Balance Support: No upper extremity supported;Feet supported Static Sitting - Level of Assistance: 6: Modified independent (Device/Increase time) Dynamic Sitting Balance Dynamic Sitting - Balance Support: No upper extremity supported;During functional activity Dynamic Sitting - Level of Assistance: 6: Modified independent (Device/Increase time) Static Standing Balance Static Standing - Balance Support: During functional activity Static Standing - Level of Assistance: 5: Stand by assistance Dynamic Standing Balance Dynamic Standing - Balance  Support: During functional activity Dynamic Standing - Level of Assistance: 5: Stand by assistance Extremity/Trunk Assessment RUE Assessment RUE Assessment: Exceptions to WFL (Brunnstrum stage II in the arm with trace shoulder movements noted.  Demonstrates stage I in the hand.  PROM WFLS for all joints with slight one finger inferior subluxation present.  ) LUE Assessment LUE Assessment: Within Functional Limits   See Function Navigator for Current Functional Status.  Kandance Yano OTR/L 03/18/2017, 4:26 PM
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
## 2017-03-18 NOTE — Progress Notes (Signed)
Pt noted to have friends and family at bedside at this time.  Pt denies complaints and is in NAD at this time.  RN will continue to monitor.

## 2017-03-18 NOTE — Progress Notes (Signed)
Occupational Therapy Session Note  Patient Details  Name: Arthur Beasley MRN: [id number] Date of Birth: 1981/05/23  Today's Date: 03/18/2017 OT Individual Time: 0347-4259 OT Individual Time Calculation (min): 54 min    Short Term Goals: Week 4:  OT Short Term Goal 1 (Week 4): Continue working on [facility] as supervison to min assist level.  Skilled Therapeutic Interventions/Progress Updates:    Pt completed shower and dressing during session.  Min guard assist for ambulation to the dresser to gather clothing necessary for shower.  Initially pt ambulated to the shower and removed clothing and attempted to have pt gather clothing by continuing to point out the door.  Finally, since therapist could not understand what he was wanting, he had the pt reluctantly ambulate back out to gather them and take them back into the shower.  Pt would not allow for any therapist observation once he was on the tub bench.  He showered but demonstrated decreased thoroughness and did not wash the LUE.  Dressing sit to stand on the seat.  Pt needed max instructional cueing to notice his shirt not being pulled down on the left side.  Therapist had him ambulate out to the mirror so he could see where it was getting stuck, as he did not follow initial cueing from therapist.  He donned socks and slippers EOB with setup as well but declined brushing his teeth.  Next had pt ambulate to tub shower in ADL apartment.  He practiced transfer using the tub bench with therapist explaining to him that he would be getting one for home.  Supervision for transfer with return back to room after completion.  Pt with no LOB during mobility this session but he does exhibit fall risk wearing loose slippers and refusing to wear his sneakers.  Still with decreased efficiency when attempting to clear the left foot.  Pt left in bed with bed alarm in place and call button in reach.    Therapy Documentation Precautions:   Precautions Precautions: Fall Precaution Comments: left flap, no bone graft; Helmet when OOB Restrictions Weight Bearing Restrictions: No  Pain: Pain Assessment Pain Assessment: No/denies pain ADL: See Function Navigator for Current Functional Status.   Therapy/Group: Individual Therapy  Tanvi Gatling OTR/L 03/18/2017, 10:33 AM

## 2017-03-18 NOTE — Plan of Care (Signed)
Problem: RH Attention Goal: LTG Patient will demonstrate focused/sustained (OT) LTG:  Patient will demonstrate focused/sustained/selective/alternating/divided attention during functional activities in specific environment with assist for # of minutes  (OT)  Outcome: Not Met (add Reason) Needs mod to max assist.  Problem: RH Awareness Goal: LTG: Patient will demonstrate intellectual/emergent (OT) LTG: Patient will demonstrate intellectual/emergent/anticipatory awareness with assist during a functional activity  (OT)  Outcome: Not Met (add Reason) Needs mod to max assist
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
## 2017-03-18 NOTE — Progress Notes (Signed)
Orthopedic Tech Progress Note Patient Details:  Arthur Beasley 02-Dec-1980 [id number]  Patient ID: Arthur Beasley, male   DOB: [birth year], 36 y.o.   MRN: [id number]   Arthur Beasley 03/18/2017, 3:59 PM Called in advanced brace order; spoke with [facility]

## 2017-03-19 LAB — CBC
HEMATOCRIT: 34.4 % — AB (ref 39.0–52.0)
HEMOGLOBIN: 11 g/dL — AB (ref 13.0–17.0)
MCH: 27.9 pg (ref 26.0–34.0)
MCHC: 32 g/dL (ref 30.0–36.0)
MCV: 87.3 fL (ref 78.0–100.0)
Platelets: 293 10*3/uL (ref 150–400)
RBC: 3.94 MIL/uL — AB (ref 4.22–5.81)
RDW: 13.5 % (ref 11.5–15.5)
WBC: 5.5 10*3/uL (ref 4.0–10.5)

## 2017-03-19 LAB — BASIC METABOLIC PANEL
ANION GAP: 10 (ref 5–15)
BUN: 11 mg/dL (ref 6–20)
CALCIUM: 9.4 mg/dL (ref 8.9–10.3)
CO2: 28 mmol/L (ref 22–32)
Chloride: 98 mmol/L — ABNORMAL LOW (ref 101–111)
Creatinine, Ser: 0.83 mg/dL (ref 0.61–1.24)
GLUCOSE: 104 mg/dL — AB (ref 65–99)
POTASSIUM: 3.9 mmol/L (ref 3.5–5.1)
Sodium: 136 mmol/L (ref 135–145)

## 2017-03-19 MED ORDER — CLOPIDOGREL BISULFATE 75 MG PO TABS: 75.0000 mg | ORAL_TABLET | Freq: Every day | ORAL | 0 refills | Status: DC

## 2017-03-19 MED ORDER — AMLODIPINE BESYLATE 5 MG PO TABS: 5.0000 mg | ORAL_TABLET | Freq: Every day | ORAL | 0 refills | Status: DC

## 2017-03-19 MED ORDER — ASPIRIN 81 MG PO CHEW: 81.0000 mg | CHEWABLE_TABLET | Freq: Every day | ORAL | 1 refills | Status: DC

## 2017-03-19 MED ORDER — LEVETIRACETAM 500 MG PO TABS: 500.0000 mg | ORAL_TABLET | Freq: Two times a day (BID) | ORAL | 0 refills | Status: DC

## 2017-03-19 NOTE — Progress Notes (Signed)
Social Work  Discharge Note  The overall goal for the admission was met for:   Discharge location: Yes - home with sister, [name], who will provide (along with other family) 24/7 assistance.  Length of Stay: Yes - 28 days  Discharge activity level: Yes - supervision overall  Home/community participation: Yes  Services provided included: MD, RD, PT, OT, SLP, RN, TR, Pharmacy and SW  Financial Services: Medicaid  Follow-up services arranged: Home Health: RN, PT, OT, ST via [place], DME: tub bench - [facility], Other: referred to [employer] for [facility] aide and Patient/Family has no preference for HH/DME agencies  Comments (or additional information):  Patient/Family verbalized understanding of follow-up arrangements: Yes  Individual responsible for coordination of the follow-up plan: pt/ sister  Confirmed correct DME delivered: Macie Baum 03/19/2017    Nekisha Mcdiarmid

## 2017-03-19 NOTE — Progress Notes (Signed)
Educated [name] (sister) and Hoarse(brother) on dressing changes. Caregivers were given supplies for dressing changes. Siblings assisted with administering am medications. Pt has all belongings. Staff transported via wheelchair to car safely.

## 2017-03-19 NOTE — Progress Notes (Addendum)
Pt non compliant refused assessment and medication. Pt demanded a shower, staff assisted him with shower and changed linen. Pt removed dressing (abd), he was picking with the area where the PEG was removed. Educated on infection, pt continue to use finger to mess with area (abd), reapplied dressing to area. Pt OOB in chair, removed chair alarm. It is difficult in providing care when pt continue to refuse.  Staff will continue to re-approach pt to administer nursing care. Deficient Knowledge related to cognitive, emotional (anxiety) state affecting learning, decrease motivation to learn, and denial of his health condition AEB denial to learn, does not follow through with instructions, and expressing for frustration by pointing, demanding, and putting staff out of room. MD is aware of pt's behavior, MD educated him as well "about accepting care from staff." Staff will continue to educate.
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
## 2017-03-19 NOTE — Discharge Instructions (Signed)
Inpatient Rehab Discharge Instructions  THAMAS APPLEYARD Discharge date and time:  03/19/17  Activities/Precautions/ Functional Status: Activity: no lifting, driving, or strenuous exercise till cleared by MD Diet: Chopped foods.  Wound Care: keep wound clean and dry   Functional status:  ___ No restrictions     ___ Walk up steps independently _X__ 24/7 supervision/assistance   ___ Walk up steps with assistance ___ Intermittent supervision/assistance  ___ Bathe/dress independently ___ Walk with walker     ___ Bathe/dress with assistance ___ Walk Independently    ___ Shower independently ___ Walk with assistance    _X__ Shower with supervision _X__ No alcohol     ___ Return to work/school ________    COMMUNITY REFERRALS UPON DISCHARGE:    Home Health:   PT     OT     ST    RN                     Agency:  [facility] Phone: [phone]   Medical Equipment/Items Ordered:  Tub bench                                                      Agency/Supplier: [facility] [phone]  Personal Care Services (PCS) Aide:   A referral has been sent to [facility].  They will contact you to evaluate your needs and then will assign an agency to provide services.   GENERAL COMMUNITY RESOURCES FOR PATIENT/FAMILY:  Support Groups: Brain Injury Support Group (handout)    Special Instructions: 1. Wear helmet when at edge of bed or out of bed.    My questions have been answered and I understand these instructions. I will adhere to these goals and the provided educational materials after my discharge from the hospital.  Patient/Caregiver Signature _______________________________ Date __________  Clinician Signature _______________________________________ Date __________  Please bring this form and your medication list with you to all your follow-up doctor's appointments.

## 2017-03-27 ENCOUNTER — Telehealth: Payer: Self-pay | Admitting: *Deleted

## 2017-03-27 NOTE — Telephone Encounter (Signed)
Alonna Minium, ST, [place] left a message for a couple of reasons: #1 she is asking for verbal orders for 1week8. (I returned the phone call and gave the verbal orders per office protocol).   #2 She reports that they patient impulsively left the house, walked to the store and bought cigarettes. The brother found him and his girlfriend picked him up by car and brought him home.  His B/P was fine, his heart rate was elevated at 135 BPM. He does not want to wear his helmet.....Marland KitchenFYI

## 2017-03-31 ENCOUNTER — Telehealth (HOSPITAL_COMMUNITY): Payer: Self-pay

## 2017-03-31 NOTE — Telephone Encounter (Signed)
Called to schedule f/u since discharge. No answer. AW

## 2017-04-01 ENCOUNTER — Telehealth: Payer: Self-pay

## 2017-04-01 NOTE — Telephone Encounter (Signed)
Alonna Minium ST - [phone], Lisette Abu RN - [phone], Diane OT [phone]  All from [facility] have Called stating that they have attempted to contact and visit and provide services for this patient and have reported that he is either unavailable when they arrive due to being out with "girlfriend" and roommates are surprised when the therapists state he hasn't returned calls and that the patient states that he doesn't need any therapy, tracy RN states will attempt to reach him one more time next week before she considers him unwilling to treat, Diane has already stopped attempting to reach patient, and allison ST will try once more as well.

## 2017-04-10 ENCOUNTER — Telehealth: Payer: Self-pay | Admitting: *Deleted

## 2017-04-10 ENCOUNTER — Other Ambulatory Visit: Payer: Self-pay | Admitting: Physical Medicine and Rehabilitation

## 2017-04-10 DIAGNOSIS — I63412 Cerebral infarction due to embolism of left middle cerebral artery: Secondary | ICD-10-CM

## 2017-04-10 DIAGNOSIS — R1310 Dysphagia, unspecified: Secondary | ICD-10-CM
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
## 2017-04-15 DIAGNOSIS — I69320 Aphasia following cerebral infarction: Secondary | ICD-10-CM | POA: Diagnosis not present

## 2017-04-15 DIAGNOSIS — D473 Essential (hemorrhagic) thrombocythemia: Secondary | ICD-10-CM | POA: Diagnosis not present

## 2017-04-15 DIAGNOSIS — Z930 Tracheostomy status: Secondary | ICD-10-CM | POA: Diagnosis not present

## 2017-04-15 DIAGNOSIS — F1010 Alcohol abuse, uncomplicated: Secondary | ICD-10-CM | POA: Diagnosis not present

## 2017-04-15 DIAGNOSIS — I7771 Dissection of carotid artery: Secondary | ICD-10-CM | POA: Diagnosis not present

## 2017-04-15 DIAGNOSIS — Z7902 Long term (current) use of antithrombotics/antiplatelets: Secondary | ICD-10-CM

## 2017-04-15 DIAGNOSIS — F440 Dissociative amnesia: Secondary | ICD-10-CM | POA: Diagnosis not present

## 2017-04-15 DIAGNOSIS — S02602D Fracture of unspecified part of body of left mandible, subsequent encounter for fracture with routine healing: Secondary | ICD-10-CM | POA: Diagnosis not present

## 2017-04-15 DIAGNOSIS — I10 Essential (primary) hypertension: Secondary | ICD-10-CM | POA: Diagnosis not present

## 2017-04-15 DIAGNOSIS — W3400XD Accidental discharge from unspecified firearms or gun, subsequent encounter: Secondary | ICD-10-CM | POA: Diagnosis not present

## 2017-04-15 DIAGNOSIS — Z7982 Long term (current) use of aspirin: Secondary | ICD-10-CM | POA: Diagnosis not present

## 2017-04-15 DIAGNOSIS — Z431 Encounter for attention to gastrostomy: Secondary | ICD-10-CM | POA: Diagnosis not present

## 2017-04-15 DIAGNOSIS — F319 Bipolar disorder, unspecified: Secondary | ICD-10-CM | POA: Diagnosis not present

## 2017-04-21 ENCOUNTER — Encounter: Payer: Medicaid Other | Attending: Physical Medicine & Rehabilitation | Admitting: Physical Medicine & Rehabilitation

## 2017-04-29 ENCOUNTER — Ambulatory Visit: Payer: Medicaid Other | Attending: Physical Medicine & Rehabilitation

## 2017-04-29 ENCOUNTER — Telehealth (HOSPITAL_COMMUNITY): Payer: Self-pay

## 2017-04-29 DIAGNOSIS — R41841 Cognitive communication deficit: Secondary | ICD-10-CM | POA: Diagnosis present

## 2017-04-29 DIAGNOSIS — R4701 Aphasia: Secondary | ICD-10-CM | POA: Insufficient documentation

## 2017-04-29 DIAGNOSIS — R482 Apraxia: Secondary | ICD-10-CM | POA: Diagnosis present

## 2017-04-29 NOTE — Telephone Encounter (Signed)
Called pt to schedule f/u, no answer, left message for return call. AW

## 2017-04-30 NOTE — Patient Instructions (Signed)
Work with Arthur Beasley in [employer] an object to one word like I did today with him.
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
## 2017-05-06 ENCOUNTER — Telehealth: Payer: Self-pay | Admitting: *Deleted

## 2017-05-06 NOTE — Telephone Encounter (Signed)
Received a call/voicemail from a "[place]" requesting OT outpt services for [employer].  She is not listed in his contacts so no contact will be made with her.If you read the notes from the HHST,OT,PT, they all called and Mr Dupre was not cooperating with requests to see him and [place] the beach.  He is not considered homebound and they were discharging services.  Due to the non compliance on his part and Dr Naaman Plummer will not be in the office for approx 2 weeks, this will be dealt with at his appt with Dr Naaman Plummer on 06/10/17.

## 2017-05-08 ENCOUNTER — Encounter (HOSPITAL_COMMUNITY): Payer: Self-pay | Admitting: *Deleted

## 2017-05-08 ENCOUNTER — Emergency Department (HOSPITAL_COMMUNITY): Payer: Medicaid Other

## 2017-05-08 ENCOUNTER — Other Ambulatory Visit: Payer: Self-pay

## 2017-05-08 ENCOUNTER — Emergency Department (HOSPITAL_COMMUNITY)
Admission: EM | Admit: 2017-05-08 | Discharge: 2017-05-09 | Disposition: A | Discharge status: Home / Self Care | Payer: Medicaid Other | Attending: Emergency Medicine | Admitting: Emergency Medicine

## 2017-05-08 DIAGNOSIS — M79601 Pain in right arm: Secondary | ICD-10-CM | POA: Insufficient documentation

## 2017-05-08 DIAGNOSIS — J029 Acute pharyngitis, unspecified: Secondary | ICD-10-CM | POA: Insufficient documentation

## 2017-05-08 DIAGNOSIS — R52 Pain, unspecified: Secondary | ICD-10-CM

## 2017-05-08 DIAGNOSIS — F17210 Nicotine dependence, cigarettes, uncomplicated: Secondary | ICD-10-CM | POA: Insufficient documentation

## 2017-05-08 DIAGNOSIS — R112 Nausea with vomiting, unspecified: Secondary | ICD-10-CM | POA: Insufficient documentation

## 2017-05-08 DIAGNOSIS — I10 Essential (primary) hypertension: Secondary | ICD-10-CM | POA: Insufficient documentation

## 2017-05-08 DIAGNOSIS — Z79899 Other long term (current) drug therapy: Secondary | ICD-10-CM | POA: Insufficient documentation

## 2017-05-08 LAB — COMPREHENSIVE METABOLIC PANEL
ALBUMIN: 3.9 g/dL (ref 3.5–5.0)
ALK PHOS: 92 U/L (ref 38–126)
ALT: 9 U/L — ABNORMAL LOW (ref 17–63)
AST: 14 U/L — AB (ref 15–41)
Anion gap: 6 (ref 5–15)
BUN: 8 mg/dL (ref 6–20)
CALCIUM: 9.1 mg/dL (ref 8.9–10.3)
CO2: 27 mmol/L (ref 22–32)
Chloride: 104 mmol/L (ref 101–111)
Creatinine, Ser: 0.87 mg/dL (ref 0.61–1.24)
GFR calc Af Amer: >60 mL/min (ref >60)
GLUCOSE: 93 mg/dL (ref 65–99)
Potassium: 3.8 mmol/L (ref 3.5–5.1)
Sodium: 137 mmol/L (ref 135–145)
Total Bilirubin: 0.5 mg/dL (ref 0.3–1.2)
Total Protein: 7.2 g/dL (ref 6.5–8.1)

## 2017-05-08 LAB — CBC
HCT: 40.3 % (ref 39.0–52.0)
Hemoglobin: 13.4 g/dL (ref 13.0–17.0)
MCH: 27.9 pg (ref 26.0–34.0)
MCHC: 33.3 g/dL (ref 30.0–36.0)
MCV: 83.8 fL (ref 78.0–100.0)
PLATELETS: 285 10*3/uL (ref 150–400)
RBC: 4.81 MIL/uL (ref 4.22–5.81)
RDW: 13.7 % (ref 11.5–15.5)
WBC: 9.3 10*3/uL (ref 4.0–10.5)

## 2017-05-08 MED ORDER — DEXAMETHASONE SODIUM PHOSPHATE 10 MG/ML IJ SOLN
10.0000 mg | Freq: Once | INTRAMUSCULAR | Status: AC
Start: 2017-05-08 — End: 2017-05-08
  Administered 2017-05-08: 10 mg via INTRAVENOUS
  Filled 2017-05-08: qty 1

## 2017-05-08 MED ORDER — GABAPENTIN 300 MG PO CAPS: 300.0000 mg | ORAL_CAPSULE | Freq: Three times a day (TID) | ORAL | 0 refills | Status: DC

## 2017-05-08 NOTE — ED Notes (Signed)
Patient transported to X-ray 
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
## 2017-05-08 NOTE — ED Notes (Signed)
Pt. returned from XR. 

## 2017-05-08 NOTE — ED Notes (Signed)
Pt caregiver states that pt has been coughing up green sputum today, denies fevers, care giver also concerned about R arm being cooler then the other, positive PMS.

## 2017-05-08 NOTE — ED Triage Notes (Signed)
Pt to ED via pov for eval of nausea and vomiting since this am. Pt discharged from [facility] 5/9 after 2-3 month stay after stroke. Pt unable to converse due to stroke. Fiance with pt giving information. States pt is able to eat and drink since being home. Pt shakes head to pain question but unable to tell where.

## 2017-05-09 NOTE — Progress Notes (Signed)
Orthopedic Tech Progress Note Patient Details:  Arthur Beasley [birth year] [id number]  Ortho Devices Type of Ortho Device: Volar splint Ortho Device/Splint Location: rue Ortho Device/Splint Interventions: Ordered, Application, Adjustment   Karolee Stamps 05/09/2017, 12:56 AM

## 2017-05-17 NOTE — ED Provider Notes (Signed)
Pt seen and evaluated. D/w Dr. Rene Kocher. Pt with pharyngitis. Main c/o is rue pain. Possible RSD of RUE. Agree with treatment for pharyngits.   Tanna Furry, MD 05/17/17 [phone]

## 2017-05-27 ENCOUNTER — Ambulatory Visit: Payer: Medicaid Other | Attending: Physical Medicine & Rehabilitation

## 2017-05-27 ENCOUNTER — Other Ambulatory Visit: Payer: Self-pay

## 2017-05-27 DIAGNOSIS — R4701 Aphasia: Secondary | ICD-10-CM | POA: Insufficient documentation

## 2017-05-27 DIAGNOSIS — R41841 Cognitive communication deficit: Secondary | ICD-10-CM | POA: Insufficient documentation

## 2017-05-27 DIAGNOSIS — R482 Apraxia: Secondary | ICD-10-CM | POA: Insufficient documentation

## 2017-05-29 ENCOUNTER — Ambulatory Visit: Payer: Medicaid Other

## 2017-06-01 ENCOUNTER — Other Ambulatory Visit: Payer: Self-pay

## 2017-06-01 NOTE — Patient Outreach (Signed)
Telephone outreach to patient to obtain mRs was successfully completed. mRs= 3. 

## 2017-06-03 ENCOUNTER — Encounter: Payer: Medicaid Other | Attending: Physical Medicine & Rehabilitation | Admitting: Physical Medicine & Rehabilitation

## 2017-06-04 ENCOUNTER — Encounter: Payer: Medicaid Other | Admitting: Speech Pathology

## 2017-06-09 ENCOUNTER — Telehealth: Payer: Self-pay | Admitting: Physical Medicine & Rehabilitation

## 2017-06-09 NOTE — Telephone Encounter (Signed)
Patient family Caren Griffins called and left voicemail needing to know ?? - could not understand as phone cut out - phoned home left voicemail to return call

## 2017-06-10 ENCOUNTER — Ambulatory Visit: Payer: Medicaid Other

## 2017-06-10 ENCOUNTER — Inpatient Hospital Stay: Payer: Medicaid Other | Admitting: Physical Medicine & Rehabilitation

## 2017-06-10 DIAGNOSIS — R4701 Aphasia: Secondary | ICD-10-CM | POA: Diagnosis not present

## 2017-06-10 DIAGNOSIS — R482 Apraxia: Secondary | ICD-10-CM

## 2017-06-10 DIAGNOSIS — R41841 Cognitive communication deficit: Secondary | ICD-10-CM
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
## 2017-06-10 NOTE — Patient Instructions (Signed)
Practice your months, days, and words every day!

## 2017-06-12 ENCOUNTER — Ambulatory Visit: Payer: Medicaid Other

## 2017-06-12 DIAGNOSIS — R482 Apraxia: Secondary | ICD-10-CM

## 2017-06-12 DIAGNOSIS — R41841 Cognitive communication deficit: Secondary | ICD-10-CM

## 2017-06-12 DIAGNOSIS — R4701 Aphasia: Secondary | ICD-10-CM | POA: Diagnosis not present

## 2017-06-12 NOTE — Telephone Encounter (Signed)
Pt hasn't been seen since May. Refused all therapy except for SLP. Went to once session. Pt no-showed or cancelled visit with me.   He will need an office visit before any referrals are made.

## 2017-06-12 NOTE — Telephone Encounter (Signed)
Mother is not listed as contact.

## 2017-06-12 NOTE — Telephone Encounter (Signed)
Pt's mother phoned and stated pt needed referral to OT. Please advise.

## 2017-06-12 NOTE — Patient Instructions (Signed)
    Today I saw that Arthur Beasley understands better if Arthur Beasley sees the words people are talking to him. So, write down a couple words for him (or type them into your phone) when you ask him something or talk to him.  Arthur Beasley didn't always understand something the first time I asked, so you will have to REPEAT what you ask him or tell him a lot.  Keep working on those simple words (moo, pea, pie and the other ones) at home.  Keep working on the days of the week and months of the year at home.
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
## 2017-06-16 ENCOUNTER — Emergency Department (HOSPITAL_COMMUNITY): Payer: Medicaid Other

## 2017-06-16 ENCOUNTER — Inpatient Hospital Stay (HOSPITAL_COMMUNITY)
Admission: EM | Admit: 2017-06-16 | Discharge: 2017-06-18 | DRG: 137 | Discharge status: Against Medical Advice | Payer: Medicaid Other | Attending: Internal Medicine | Admitting: Internal Medicine

## 2017-06-16 ENCOUNTER — Ambulatory Visit: Payer: Medicaid Other

## 2017-06-16 ENCOUNTER — Ambulatory Visit (HOSPITAL_COMMUNITY)
Admission: EM | Admit: 2017-06-16 | Discharge: 2017-06-16 | Disposition: A | Discharge status: Other Acute Inpatient Hospital | Payer: Medicaid Other

## 2017-06-16 ENCOUNTER — Emergency Department (HOSPITAL_COMMUNITY)
Admission: EM | Admit: 2017-06-16 | Discharge: 2017-06-16 | Disposition: A | Discharge status: Home / Self Care | Payer: Medicaid Other | Source: Home / Self Care

## 2017-06-16 ENCOUNTER — Encounter (HOSPITAL_COMMUNITY): Payer: Self-pay | Admitting: Emergency Medicine

## 2017-06-16 DIAGNOSIS — F17200 Nicotine dependence, unspecified, uncomplicated: Secondary | ICD-10-CM | POA: Diagnosis present

## 2017-06-16 DIAGNOSIS — Z833 Family history of diabetes mellitus: Secondary | ICD-10-CM

## 2017-06-16 DIAGNOSIS — R609 Edema, unspecified: Secondary | ICD-10-CM | POA: Diagnosis present

## 2017-06-16 DIAGNOSIS — I6340 Cerebral infarction due to embolism of unspecified cerebral artery: Secondary | ICD-10-CM | POA: Diagnosis present

## 2017-06-16 DIAGNOSIS — Z7982 Long term (current) use of aspirin: Secondary | ICD-10-CM

## 2017-06-16 DIAGNOSIS — D649 Anemia, unspecified: Secondary | ICD-10-CM | POA: Diagnosis present

## 2017-06-16 DIAGNOSIS — Z91040 Latex allergy status: Secondary | ICD-10-CM

## 2017-06-16 DIAGNOSIS — R41841 Cognitive communication deficit: Secondary | ICD-10-CM

## 2017-06-16 DIAGNOSIS — Z9889 Other specified postprocedural states: Secondary | ICD-10-CM

## 2017-06-16 DIAGNOSIS — Z823 Family history of stroke: Secondary | ICD-10-CM

## 2017-06-16 DIAGNOSIS — R482 Apraxia: Secondary | ICD-10-CM

## 2017-06-16 DIAGNOSIS — Z880 Allergy status to penicillin: Secondary | ICD-10-CM

## 2017-06-16 DIAGNOSIS — R4701 Aphasia: Secondary | ICD-10-CM

## 2017-06-16 DIAGNOSIS — Z5321 Procedure and treatment not carried out due to patient leaving prior to being seen by health care provider: Secondary | ICD-10-CM | POA: Insufficient documentation

## 2017-06-16 DIAGNOSIS — G8191 Hemiplegia, unspecified affecting right dominant side: Secondary | ICD-10-CM | POA: Diagnosis present

## 2017-06-16 DIAGNOSIS — L0291 Cutaneous abscess, unspecified: Secondary | ICD-10-CM

## 2017-06-16 DIAGNOSIS — I10 Essential (primary) hypertension: Secondary | ICD-10-CM | POA: Diagnosis present

## 2017-06-16 DIAGNOSIS — Z79899 Other long term (current) drug therapy: Secondary | ICD-10-CM

## 2017-06-16 DIAGNOSIS — F17210 Nicotine dependence, cigarettes, uncomplicated: Secondary | ICD-10-CM | POA: Diagnosis present

## 2017-06-16 DIAGNOSIS — J390 Retropharyngeal and parapharyngeal abscess: Principal | ICD-10-CM | POA: Diagnosis present

## 2017-06-16 LAB — CBC WITH DIFFERENTIAL/PLATELET
BASOS PCT: 0 %
Basophils Absolute: 0 10*3/uL (ref 0.0–0.1)
EOS ABS: 0 10*3/uL (ref 0.0–0.7)
EOS PCT: 0 %
HCT: 37.2 % — ABNORMAL LOW (ref 39.0–52.0)
Hemoglobin: 12.5 g/dL — ABNORMAL LOW (ref 13.0–17.0)
Lymphocytes Relative: 14 %
Lymphs Abs: 1.3 10*3/uL (ref 0.7–4.0)
MCH: 27.2 pg (ref 26.0–34.0)
MCHC: 33.6 g/dL (ref 30.0–36.0)
MCV: 80.9 fL (ref 78.0–100.0)
MONO ABS: 1.2 10*3/uL — AB (ref 0.1–1.0)
Monocytes Relative: 13 %
Neutro Abs: 6.8 10*3/uL (ref 1.7–7.7)
Neutrophils Relative %: 73 %
PLATELETS: 231 10*3/uL (ref 150–400)
RBC: 4.6 MIL/uL (ref 4.22–5.81)
RDW: 14.7 % (ref 11.5–15.5)
WBC: 9.4 10*3/uL (ref 4.0–10.5)

## 2017-06-16 LAB — COMPREHENSIVE METABOLIC PANEL
ALBUMIN: 3.9 g/dL (ref 3.5–5.0)
ALK PHOS: 76 U/L (ref 38–126)
ALT: 13 U/L — ABNORMAL LOW (ref 17–63)
AST: 15 U/L (ref 15–41)
Anion gap: 11 (ref 5–15)
BILIRUBIN TOTAL: 0.7 mg/dL (ref 0.3–1.2)
BUN: 11 mg/dL (ref 6–20)
CO2: 25 mmol/L (ref 22–32)
CREATININE: 0.89 mg/dL (ref 0.61–1.24)
Calcium: 9.3 mg/dL (ref 8.9–10.3)
Chloride: 103 mmol/L (ref 101–111)
GFR calc Af Amer: >60 mL/min (ref >60)
GLUCOSE: 87 mg/dL (ref 65–99)
Potassium: 3.8 mmol/L (ref 3.5–5.1)
Sodium: 139 mmol/L (ref 135–145)
TOTAL PROTEIN: 7.5 g/dL (ref 6.5–8.1)

## 2017-06-16 LAB — RAPID STREP SCREEN (MED CTR MEBANE ONLY): Streptococcus, Group A Screen (Direct): NEGATIVE

## 2017-06-16 MED ORDER — MORPHINE SULFATE (PF) 2 MG/ML IV SOLN
4.0000 mg | Freq: Once | INTRAVENOUS | Status: AC
  Administered 2017-06-16: 4 mg via INTRAVENOUS
  Filled 2017-06-16: qty 2

## 2017-06-16 MED ORDER — VANCOMYCIN HCL IN DEXTROSE 1-5 GM/200ML-% IV SOLN
1000.0000 mg | Freq: Once | INTRAVENOUS | Status: AC
  Administered 2017-06-17: 1000 mg via INTRAVENOUS
  Filled 2017-06-16: qty 200

## 2017-06-16 MED ORDER — SODIUM CHLORIDE 0.9 % IV BOLUS (SEPSIS): 500.0000 mL | Freq: Once | INTRAVENOUS | Status: DC

## 2017-06-16 MED ORDER — CEFEPIME HCL 2 G IJ SOLR
2.0000 g | Freq: Once | INTRAMUSCULAR | Status: AC
  Administered 2017-06-17: 2 g via INTRAVENOUS
  Filled 2017-06-16: qty 2

## 2017-06-16 MED ORDER — SODIUM CHLORIDE 0.9 % IV BOLUS (SEPSIS)
1000.0000 mL | Freq: Once | INTRAVENOUS | Status: AC
  Administered 2017-06-16: 1000 mL via INTRAVENOUS

## 2017-06-16 MED ORDER — IOPAMIDOL (ISOVUE-300) INJECTION 61%
75.0000 mL | Freq: Once | INTRAVENOUS | Status: AC | PRN
  Administered 2017-06-16: 75 mL via INTRAVENOUS
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
## 2017-06-16 NOTE — ED Notes (Signed)
Told triage RN that they cannot wait any longer.
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
## 2017-06-16 NOTE — ED Notes (Signed)
Patient transported to CT 

## 2017-06-16 NOTE — Therapy (Signed)
[facility] [address] [place], [place], [address] Phone: [phone]   Fax:  [phone]  Patient Details  Name: Arthur Beasley MRN: [id number] Date of Birth: Feb 09, 1981 Referring Provider: Alger [place], MD  Encounter Date: 06/16/2017  ST Arrived/Cancel  Pt arrived today for speech therapy reporting 10/10 pain in lt neck near ramus of mandible radiating across midline near thyroid and continuing to rt side. Pt not smiling and attempting to verbalize with SLP, as is his WNL behavior. SLP did cursory exam of lt neck and some swelling is notable.  Pt's voice is "wet" today, which has not been the case in previous sessions.    SLP encouraged pt/girlfriend to go to ED for assessment.  Garald Balding ,[place], [place] Speech-Language Pathologist  06/16/2017, 12:20 PM  [facility] [address] [place], [place], [address] Phone: [phone]   Fax:  [phone]

## 2017-06-16 NOTE — ED Notes (Signed)
ED Provider at bedside. 

## 2017-06-17 ENCOUNTER — Ambulatory Visit: Admit: 2017-06-17 | Payer: Medicaid Other | Admitting: Otolaryngology

## 2017-06-17 ENCOUNTER — Inpatient Hospital Stay (HOSPITAL_COMMUNITY): Payer: Medicaid Other | Admitting: Anesthesiology

## 2017-06-17 ENCOUNTER — Encounter (HOSPITAL_COMMUNITY): Payer: Self-pay | Admitting: Internal Medicine

## 2017-06-17 ENCOUNTER — Other Ambulatory Visit: Payer: Self-pay | Admitting: Physical Medicine & Rehabilitation

## 2017-06-17 ENCOUNTER — Encounter (HOSPITAL_COMMUNITY)
Admission: EM | Discharge status: Against Medical Advice | Payer: Self-pay | Source: Home / Self Care | Attending: Internal Medicine

## 2017-06-17 DIAGNOSIS — Z823 Family history of stroke: Secondary | ICD-10-CM | POA: Diagnosis not present

## 2017-06-17 DIAGNOSIS — I10 Essential (primary) hypertension: Secondary | ICD-10-CM | POA: Diagnosis not present

## 2017-06-17 DIAGNOSIS — D649 Anemia, unspecified: Secondary | ICD-10-CM | POA: Diagnosis not present

## 2017-06-17 DIAGNOSIS — Z880 Allergy status to penicillin: Secondary | ICD-10-CM | POA: Diagnosis not present

## 2017-06-17 DIAGNOSIS — Z9889 Other specified postprocedural states: Secondary | ICD-10-CM | POA: Diagnosis not present

## 2017-06-17 DIAGNOSIS — J390 Retropharyngeal and parapharyngeal abscess: Secondary | ICD-10-CM | POA: Diagnosis not present

## 2017-06-17 DIAGNOSIS — F17210 Nicotine dependence, cigarettes, uncomplicated: Secondary | ICD-10-CM | POA: Diagnosis not present

## 2017-06-17 DIAGNOSIS — G8191 Hemiplegia, unspecified affecting right dominant side: Secondary | ICD-10-CM | POA: Diagnosis not present

## 2017-06-17 DIAGNOSIS — Z7982 Long term (current) use of aspirin: Secondary | ICD-10-CM | POA: Diagnosis not present

## 2017-06-17 DIAGNOSIS — R609 Edema, unspecified: Secondary | ICD-10-CM | POA: Diagnosis present

## 2017-06-17 DIAGNOSIS — F17200 Nicotine dependence, unspecified, uncomplicated: Secondary | ICD-10-CM | POA: Diagnosis present

## 2017-06-17 DIAGNOSIS — Z91040 Latex allergy status: Secondary | ICD-10-CM | POA: Diagnosis not present

## 2017-06-17 DIAGNOSIS — L0291 Cutaneous abscess, unspecified: Secondary | ICD-10-CM | POA: Diagnosis present

## 2017-06-17 DIAGNOSIS — Z833 Family history of diabetes mellitus: Secondary | ICD-10-CM | POA: Diagnosis not present

## 2017-06-17 DIAGNOSIS — Z79899 Other long term (current) drug therapy: Secondary | ICD-10-CM | POA: Diagnosis not present

## 2017-06-17 HISTORY — PX: INCISION AND DRAINAGE ABSCESS: SHX5864

## 2017-06-17 SURGERY — INCISION AND DRAINAGE, ABSCESS
Anesthesia: General | Site: Throat

## 2017-06-17 SURGERY — INCISION AND DRAINAGE, ABSCESS
Anesthesia: General

## 2017-06-17 MED ORDER — CHLORHEXIDINE GLUCONATE CLOTH 2 % EX PADS: 6.0000 | MEDICATED_PAD | Freq: Once | CUTANEOUS | Status: DC

## 2017-06-17 MED ORDER — FENTANYL CITRATE (PF) 100 MCG/2ML IJ SOLN
INTRAMUSCULAR | Status: DC | PRN
  Administered 2017-06-17: 50 ug via INTRAVENOUS
  Administered 2017-06-17 (×3): 100 ug via INTRAVENOUS

## 2017-06-17 MED ORDER — OXYCODONE HCL 5 MG PO TABS: 5.0000 mg | ORAL_TABLET | Freq: Once | ORAL | Status: DC | PRN

## 2017-06-17 MED ORDER — HEPARIN SODIUM (PORCINE) 5000 UNIT/ML IJ SOLN
5000.0000 [IU] | Freq: Three times a day (TID) | INTRAMUSCULAR | Status: DC
  Administered 2017-06-18: 5000 [IU] via SUBCUTANEOUS
  Filled 2017-06-17: qty 1

## 2017-06-17 MED ORDER — ONDANSETRON HCL 4 MG PO TABS: 4.0000 mg | ORAL_TABLET | Freq: Four times a day (QID) | ORAL | Status: DC | PRN

## 2017-06-17 MED ORDER — MIDAZOLAM HCL 2 MG/2ML IJ SOLN
INTRAMUSCULAR | Status: AC
  Filled 2017-06-17: qty 2

## 2017-06-17 MED ORDER — ROCURONIUM BROMIDE 10 MG/ML (PF) SYRINGE
PREFILLED_SYRINGE | INTRAVENOUS | Status: AC
  Filled 2017-06-17: qty 5

## 2017-06-17 MED ORDER — MIDAZOLAM HCL 5 MG/5ML IJ SOLN
INTRAMUSCULAR | Status: DC | PRN
  Administered 2017-06-17 (×2): 1 mg via INTRAVENOUS

## 2017-06-17 MED ORDER — TRIPLE ANTIBIOTIC 3.5-400-5000 EX OINT
1.0000 "application " | TOPICAL_OINTMENT | Freq: Once | CUTANEOUS | Status: DC | PRN
  Filled 2017-06-17: qty 1

## 2017-06-17 MED ORDER — PROPOFOL 10 MG/ML IV BOLUS
INTRAVENOUS | Status: DC | PRN
  Administered 2017-06-17: 150 mg via INTRAVENOUS

## 2017-06-17 MED ORDER — SUCCINYLCHOLINE CHLORIDE 200 MG/10ML IV SOSY
PREFILLED_SYRINGE | INTRAVENOUS | Status: AC
  Filled 2017-06-17: qty 10

## 2017-06-17 MED ORDER — HYDRALAZINE HCL 20 MG/ML IJ SOLN
5.0000 mg | INTRAMUSCULAR | Status: DC | PRN
  Administered 2017-06-18: 5 mg via INTRAVENOUS
  Filled 2017-06-17: qty 1

## 2017-06-17 MED ORDER — LIDOCAINE HCL 4 % EX SOLN
0.0000 mL | Freq: Once | CUTANEOUS | Status: DC | PRN
  Filled 2017-06-17: qty 50

## 2017-06-17 MED ORDER — SODIUM CHLORIDE 0.9 % IV SOLN
500.0000 mg | Freq: Once | INTRAVENOUS | Status: AC
  Administered 2017-06-17: 500 mg via INTRAVENOUS
  Filled 2017-06-17: qty 5

## 2017-06-17 MED ORDER — LABETALOL HCL 5 MG/ML IV SOLN
10.0000 mg | INTRAVENOUS | Status: DC | PRN
  Administered 2017-06-17: 10 mg via INTRAVENOUS

## 2017-06-17 MED ORDER — HYDROMORPHONE HCL 1 MG/ML IJ SOLN
0.2500 mg | INTRAMUSCULAR | Status: DC | PRN
  Administered 2017-06-17 (×2): 0.5 mg via INTRAVENOUS

## 2017-06-17 MED ORDER — KETOROLAC TROMETHAMINE 15 MG/ML IJ SOLN
15.0000 mg | Freq: Once | INTRAMUSCULAR | Status: AC
  Administered 2017-06-17: 15 mg via INTRAVENOUS
  Filled 2017-06-17: qty 1

## 2017-06-17 MED ORDER — SODIUM CHLORIDE 0.45 % IV SOLN
INTRAVENOUS | Status: AC
  Administered 2017-06-17: 06:00:00 via INTRAVENOUS
  Administered 2017-06-17 (×2): 100 mL/h via INTRAVENOUS

## 2017-06-17 MED ORDER — MORPHINE SULFATE (PF) 2 MG/ML IV SOLN
2.0000 mg | INTRAVENOUS | Status: DC | PRN
  Administered 2017-06-18 (×5): 2 mg via INTRAVENOUS
  Filled 2017-06-17 (×5): qty 1

## 2017-06-17 MED ORDER — ONDANSETRON HCL 4 MG/2ML IJ SOLN
INTRAMUSCULAR | Status: AC
  Filled 2017-06-17: qty 2

## 2017-06-17 MED ORDER — DEXAMETHASONE SODIUM PHOSPHATE 10 MG/ML IJ SOLN
INTRAMUSCULAR | Status: AC
  Filled 2017-06-17: qty 1

## 2017-06-17 MED ORDER — HEPARIN SODIUM (PORCINE) 5000 UNIT/ML IJ SOLN: 5000.0000 [IU] | Freq: Three times a day (TID) | INTRAMUSCULAR | Status: DC

## 2017-06-17 MED ORDER — LABETALOL HCL 5 MG/ML IV SOLN
10.0000 mg | INTRAVENOUS | Status: AC | PRN
  Administered 2017-06-17 (×2): 10 mg via INTRAVENOUS

## 2017-06-17 MED ORDER — MEPERIDINE HCL 25 MG/ML IJ SOLN
INTRAMUSCULAR | Status: AC
  Filled 2017-06-17: qty 1

## 2017-06-17 MED ORDER — ONDANSETRON HCL 4 MG/2ML IJ SOLN
4.0000 mg | Freq: Four times a day (QID) | INTRAMUSCULAR | Status: AC | PRN
  Administered 2017-06-17: 4 mg via INTRAVENOUS

## 2017-06-17 MED ORDER — CHLORHEXIDINE GLUCONATE 0.12 % MT SOLN
5.0000 mL | Freq: Four times a day (QID) | OROMUCOSAL | Status: DC
  Administered 2017-06-18: 5 mL via OROMUCOSAL
  Filled 2017-06-17: qty 15

## 2017-06-17 MED ORDER — FENTANYL CITRATE (PF) 250 MCG/5ML IJ SOLN
INTRAMUSCULAR | Status: AC
  Filled 2017-06-17: qty 5

## 2017-06-17 MED ORDER — FAMOTIDINE IN NACL 20-0.9 MG/50ML-% IV SOLN
20.0000 mg | Freq: Once | INTRAVENOUS | Status: AC
  Administered 2017-06-17: 20 mg via INTRAVENOUS
  Filled 2017-06-17: qty 50

## 2017-06-17 MED ORDER — DEXTROSE 5 % IV SOLN
1.0000 g | Freq: Three times a day (TID) | INTRAVENOUS | Status: DC
  Administered 2017-06-18 (×2): 1 g via INTRAVENOUS
  Filled 2017-06-17 (×6): qty 1

## 2017-06-17 MED ORDER — LABETALOL HCL 5 MG/ML IV SOLN
INTRAVENOUS | Status: AC
  Filled 2017-06-17: qty 4

## 2017-06-17 MED ORDER — BUPIVACAINE-EPINEPHRINE (PF) 0.5% -1:200000 IJ SOLN
INTRAMUSCULAR | Status: AC
  Filled 2017-06-17: qty 30

## 2017-06-17 MED ORDER — ONDANSETRON HCL 4 MG/2ML IJ SOLN: 4.0000 mg | Freq: Four times a day (QID) | INTRAMUSCULAR | Status: DC | PRN

## 2017-06-17 MED ORDER — MEPERIDINE HCL 25 MG/ML IJ SOLN
6.2500 mg | INTRAMUSCULAR | Status: DC | PRN
  Administered 2017-06-17: 12.5 mg via INTRAVENOUS

## 2017-06-17 MED ORDER — LIDOCAINE 2% (20 MG/ML) 5 ML SYRINGE
INTRAMUSCULAR | Status: AC
  Filled 2017-06-17: qty 5

## 2017-06-17 MED ORDER — OXYCODONE HCL 5 MG/5ML PO SOLN: 5.0000 mg | Freq: Once | ORAL | Status: DC | PRN

## 2017-06-17 MED ORDER — HYDROMORPHONE HCL 1 MG/ML IJ SOLN
INTRAMUSCULAR | Status: AC
  Filled 2017-06-17: qty 1

## 2017-06-17 MED ORDER — LIDOCAINE HCL 2 % EX GEL
1.0000 "application " | Freq: Once | CUTANEOUS | Status: DC | PRN
  Filled 2017-06-17: qty 5

## 2017-06-17 MED ORDER — SODIUM CHLORIDE 0.9 % IV SOLN
INTRAVENOUS | Status: DC
  Administered 2017-06-17: 23:00:00 via INTRAVENOUS

## 2017-06-17 MED ORDER — LACTATED RINGERS IV SOLN
INTRAVENOUS | Status: DC
  Administered 2017-06-17 (×3): via INTRAVENOUS

## 2017-06-17 MED ORDER — NICOTINE 21 MG/24HR TD PT24
21.0000 mg | MEDICATED_PATCH | Freq: Every day | TRANSDERMAL | Status: DC
  Administered 2017-06-17: 21 mg via TRANSDERMAL
  Filled 2017-06-17: qty 1

## 2017-06-17 MED ORDER — LEVETIRACETAM 500 MG PO TABS
500.0000 mg | ORAL_TABLET | Freq: Two times a day (BID) | ORAL | Status: DC
  Administered 2017-06-17: 500 mg via ORAL
  Filled 2017-06-17: qty 1

## 2017-06-17 MED ORDER — GLYCOPYRROLATE 0.2 MG/ML IJ SOLN
INTRAMUSCULAR | Status: DC | PRN
  Administered 2017-06-17: 0.6 mg via INTRAVENOUS

## 2017-06-17 MED ORDER — LIDOCAINE-EPINEPHRINE (PF) 1 %-1:200000 IJ SOLN
0.0000 mL | Freq: Once | INTRAMUSCULAR | Status: DC | PRN
  Filled 2017-06-17: qty 30

## 2017-06-17 MED ORDER — OXYMETAZOLINE HCL 0.05 % NA SOLN
1.0000 | Freq: Once | NASAL | Status: DC | PRN
  Filled 2017-06-17: qty 15

## 2017-06-17 MED ORDER — METHYLPREDNISOLONE SODIUM SUCC 40 MG IJ SOLR
40.0000 mg | Freq: Once | INTRAMUSCULAR | Status: AC
  Administered 2017-06-17: 40 mg via INTRAVENOUS
  Filled 2017-06-17: qty 1

## 2017-06-17 MED ORDER — ROCURONIUM BROMIDE 10 MG/ML (PF) SYRINGE
PREFILLED_SYRINGE | INTRAVENOUS | Status: DC | PRN
  Administered 2017-06-17: 30 mg via INTRAVENOUS

## 2017-06-17 MED ORDER — SUCCINYLCHOLINE CHLORIDE 200 MG/10ML IV SOSY
PREFILLED_SYRINGE | INTRAVENOUS | Status: DC | PRN
  Administered 2017-06-17: 100 mg via INTRAVENOUS

## 2017-06-17 MED ORDER — NEOSTIGMINE METHYLSULFATE 10 MG/10ML IV SOLN
INTRAVENOUS | Status: DC | PRN
  Administered 2017-06-17: 4 mg via INTRAVENOUS

## 2017-06-17 MED ORDER — DEXAMETHASONE SODIUM PHOSPHATE 10 MG/ML IJ SOLN
INTRAMUSCULAR | Status: DC | PRN
  Administered 2017-06-17: 10 mg via INTRAVENOUS

## 2017-06-17 MED ORDER — ONDANSETRON HCL 4 MG/2ML IJ SOLN
INTRAMUSCULAR | Status: DC | PRN
  Administered 2017-06-17: 4 mg via INTRAVENOUS

## 2017-06-17 MED ORDER — VANCOMYCIN HCL IN DEXTROSE 750-5 MG/150ML-% IV SOLN
750.0000 mg | Freq: Two times a day (BID) | INTRAVENOUS | Status: DC
  Administered 2017-06-18: 750 mg via INTRAVENOUS
  Filled 2017-06-17 (×5): qty 150

## 2017-06-17 MED ORDER — PROPOFOL 10 MG/ML IV BOLUS
INTRAVENOUS | Status: AC
  Filled 2017-06-17: qty 20

## 2017-06-17 MED ORDER — SILVER NITRATE-POT NITRATE 75-25 % EX MISC
1.0000 | Freq: Once | CUTANEOUS | Status: DC | PRN
  Filled 2017-06-17: qty 1

## 2017-06-17 SURGICAL SUPPLY — 44 items
BLADE SURG 15 STRL LF DISP TIS (BLADE) IMPLANT
BLADE SURG 15 STRL SS (BLADE)
CANISTER SUCT 3000ML PPV (MISCELLANEOUS) ×3 IMPLANT
CATH ROBINSON RED A/P 10FR (CATHETERS) IMPLANT
CLEANER TIP ELECTROSURG 2X2 (MISCELLANEOUS) ×3 IMPLANT
COAGULATOR SUCT 6 FR SWTCH (ELECTROSURGICAL)
COAGULATOR SUCT SWTCH 10FR 6 (ELECTROSURGICAL) IMPLANT
CRADLE DONUT ADULT HEAD (MISCELLANEOUS) IMPLANT
DECANTER SPIKE VIAL GLASS SM (MISCELLANEOUS) ×3 IMPLANT
DRAPE HALF SHEET 40X57 (DRAPES) IMPLANT
ELECT COATED BLADE 2.86 ST (ELECTRODE) ×3 IMPLANT
ELECT REM PT RETURN 9FT ADLT (ELECTROSURGICAL)
ELECT REM PT RETURN 9FT PED (ELECTROSURGICAL)
ELECTRODE REM PT RETRN 9FT PED (ELECTROSURGICAL) IMPLANT
ELECTRODE REM PT RTRN 9FT ADLT (ELECTROSURGICAL) IMPLANT
GAUZE IODOFORM PACK 1/2 7832 (GAUZE/BANDAGES/DRESSINGS) ×2 IMPLANT
GAUZE SPONGE 4X4 16PLY XRAY LF (GAUZE/BANDAGES/DRESSINGS) ×3 IMPLANT
GLOVE ECLIPSE 8.0 STRL XLNG CF (GLOVE) ×3 IMPLANT
GOWN STRL REUS W/ TWL LRG LVL3 (GOWN DISPOSABLE) ×1 IMPLANT
GOWN STRL REUS W/ TWL XL LVL3 (GOWN DISPOSABLE) ×1 IMPLANT
GOWN STRL REUS W/TWL LRG LVL3 (GOWN DISPOSABLE) ×3
GOWN STRL REUS W/TWL XL LVL3 (GOWN DISPOSABLE) ×3
KIT BASIN OR (CUSTOM PROCEDURE TRAY) ×3 IMPLANT
KIT ROOM TURNOVER OR (KITS) ×3 IMPLANT
NDL HYPO 25GX1X1/2 BEV (NEEDLE) IMPLANT
NDL SPNL 22GX3.5 QUINCKE BK (NEEDLE) IMPLANT
NEEDLE HYPO 25GX1X1/2 BEV (NEEDLE) IMPLANT
NEEDLE SPNL 22GX3.5 QUINCKE BK (NEEDLE) IMPLANT
NS IRRIG 1000ML POUR BTL (IV SOLUTION) ×3 IMPLANT
PACK SURGICAL SETUP 50X90 (CUSTOM PROCEDURE TRAY) ×3 IMPLANT
PAD ARMBOARD 7.5X6 YLW CONV (MISCELLANEOUS) ×6 IMPLANT
PENCIL FOOT CONTROL (ELECTRODE) ×3 IMPLANT
SPECIMEN JAR SMALL (MISCELLANEOUS) IMPLANT
SPONGE TONSIL 1 RF SGL (DISPOSABLE) ×3 IMPLANT
SUT SILK 2 0 FS (SUTURE) ×2 IMPLANT
SWAB CULTURE ESWAB REG 1ML (MISCELLANEOUS) ×3 IMPLANT
SWAB CULTURE LIQUID MINI MALE (MISCELLANEOUS) ×3 IMPLANT
SYR BULB 3OZ (MISCELLANEOUS) IMPLANT
SYR CONTROL 10ML LL (SYRINGE) IMPLANT
TOWEL OR 17X24 6PK STRL BLUE (TOWEL DISPOSABLE) ×6 IMPLANT
TUBE CONNECTING 12'X1/4 (SUCTIONS) ×1
TUBE CONNECTING 12X1/4 (SUCTIONS) ×2 IMPLANT
WATER STERILE IRR 1000ML POUR (IV SOLUTION) ×3 IMPLANT
YANKAUER SUCT BULB TIP NO VENT (SUCTIONS) ×3 IMPLANT
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
## 2017-06-17 NOTE — Progress Notes (Signed)
Notified MD [facility] of the situation with the patients blood pressure.  MD [facility] wants to take the patient to the room and have the nurses notify the hospitalist MD.  Will continue to monitor and will call report to the room.

## 2017-06-17 NOTE — Progress Notes (Addendum)
Pt arrived to floor with RN on bed. He's alert and orient and stressed from having the I&D performed. BP high. MD notified. No complaints of pain. Bloody drainage. Suction set up and pt educated on how to use it. Refused to use ice pack and took off and refuses to wear nicotine patch.  Call bell within reach. Looked for family in waiting room but they are not there at this time. Will continue to monitor closely.

## 2017-06-17 NOTE — ED Notes (Signed)
Bed: VA70 Expected date:  Expected time:  Means of arrival:  Comments: Rm 2

## 2017-06-17 NOTE — Transfer of Care (Signed)
Immediate Anesthesia Transfer of Care Note  Patient: Arthur Beasley  Procedure(s) Performed: Procedure(s): INCISION AND DRAINAGE ABSCESS TRANSORAL POSSIBLE EXTERNAL APPROACH (N/A)  Patient Location: PACU  Anesthesia Type:General  Level of Consciousness: awake, patient cooperative and responds to stimulation  Airway & Oxygen Therapy: Patient Spontanous Breathing and Patient connected to face mask oxygen  Post-op Assessment: Report given to RN and Post -op Vital signs reviewed and stable  Post vital signs: Reviewed and stable  Last Vitals:  Vitals:   06/17/17 1801 06/17/17 2048  BP: (!) 157/110 (!) (P) 160/132  Pulse: (!) 106   Resp: 18   Temp: (!) 36.3 C (P) 36.5 C    Last Pain:  Vitals:   06/17/17 1801  TempSrc: Oral  PainSc:          Complications: No apparent anesthesia complications

## 2017-06-17 NOTE — ED Notes (Signed)
[place]
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
## 2017-06-17 NOTE — Progress Notes (Signed)
Notified MD pts blood pressure has been elevated since arrival in the PACU.  Blood pressure cuff has been moved from the left to the right arm and the size has been changed to an adult small.  Pt became a little anxious a little after the arrival to the PACU and after it took a bit to determine what was wrong he needed to urinate.  I felt that this would help the blood pressure situation and temporarily it did bring the pressure down to 158/101.  But, on the next recheck it was significantly higher again.  MD ordered meds and RN will continue to monitor.

## 2017-06-17 NOTE — ED Notes (Signed)
Patient has been noncompliant with wearing pulse ox. Have tried numerous times to keep on, but when staff leaves the room, he removes the pulse ox.

## 2017-06-17 NOTE — ED Provider Notes (Signed)
Care was continued by Dr. Dayna Barker.  I did not see or examine this patient.   Montine Circle, PA-C 06/17/17 0102    Merrily Pew, MD 06/17/17 [phone]

## 2017-06-17 NOTE — ED Notes (Signed)
Patient's girlfriend, Carolin Sicks notified that [facility] was en route with the patient to [facility] Short Stay.

## 2017-06-17 NOTE — Anesthesia Preprocedure Evaluation (Addendum)
Anesthesia Evaluation  Patient identified by MRN, date of birth, ID band Patient awake    Reviewed: Allergy & Precautions, H&P , NPO status , Patient's Chart, lab work & pertinent test results  Airway Mallampati: III   Neck ROM: full  Mouth opening: Limited Mouth Opening Comment: Fullness in left submandibular area from infection.  CT shows patent air passage way. significant peri-glottic swelling.  Pt breathes comfortably. No stridor or obstructing noises. Dental   Pulmonary Current Smoker,    breath sounds clear to auscultation       Cardiovascular hypertension,  Rhythm:regular Rate:Normal     Neuro/Psych PSYCHIATRIC DISORDERS Depression Bipolar Disorder CVA    GI/Hepatic   Endo/Other    Renal/GU      Musculoskeletal   Abdominal   Peds  Hematology   Anesthesia Other Findings   Reproductive/Obstetrics                            Anesthesia Physical Anesthesia Plan  ASA: II  Anesthesia Plan: General   Post-op Pain Management:    Induction: Intravenous  PONV Risk Score and Plan: 2 and Ondansetron, Dexamethasone and Treatment may vary due to age or medical condition  Airway Management Planned: Oral ETT and Video Laryngoscope Planned  Additional Equipment:   Intra-op Plan:   Post-operative Plan: Possible Post-op intubation/ventilation  Informed Consent: I have reviewed the patients History and Physical, chart, labs and discussed the procedure including the risks, benefits and alternatives for the proposed anesthesia with the patient or authorized representative who has indicated his/her understanding and acceptance.     Plan Discussed with: CRNA, Anesthesiologist and Surgeon  Anesthesia Plan Comments:        Anesthesia Quick Evaluation
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
## 2017-06-17 NOTE — Progress Notes (Signed)
Notified Anesthesia MD that pts blood pressure is still very elevated after giving the 3 doses of labetalol.  Anesthesia says move him to the unit and let the nurses call the appropriate next call.  Will continue to monitor.
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
## 2017-06-17 NOTE — ED Notes (Signed)
Patient's girlfriend called to notify of surgery scheduled at 33. Asked patient's girlfriend if his sister was the next of kin x 2. Patient's girlfriend stated, "I will be up there" and never would answer the question. (OR consent needs to be signed and ws rying to inquire about next of kin.)
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
## 2017-06-17 NOTE — ED Notes (Signed)
SPO2 not 83%. Rechecked -sats 96% on room air.

## 2017-06-17 NOTE — ED Notes (Signed)
Patient has had no worsening of swallowing while in the ED.

## 2017-06-17 NOTE — Care Management Note (Signed)
Case Management Note  Patient Details  Name: Arthur Beasley MRN: [id number] Date of Birth: [birth year]  Subjective/Objective:                  difficulty swallowing  Action/Plan: ED CM spoke with the patient at the bedside to consult for medication assistance. Patient has Medicaid. He is unable to provide the name of the medication(s) he is unable to afford. Patient is being admitted. CM to continue to follow for discharge needs.   Expected Discharge Date:   (unknown)               Expected Discharge Plan:  Home/Self Care  In-House Referral:     Discharge planning Services     Post Acute Care Choice:    Choice offered to:     DME Arranged:    DME Agency:     HH Arranged:    HH Agency:     Status of Service:  In process, will continue to follow  If discussed at Long Length of Stay Meetings, dates discussed:    Additional Comments:  Apolonio Schneiders, RN 06/17/2017, 5:44 PM

## 2017-06-17 NOTE — Progress Notes (Signed)
Patient ID: Arthur Beasley, male   DOB: [birth year], 36 y.o.   MRN: [id number] Patient was admitted early this morning for parapharyngeal abscess. He is waiting to be transferred to [facility] as per ENT recommendations. I reviewed the medical records and this morning's history and physical myself. I saw the patient bedside, reviewed labs and medications. ENT has evaluated the patient. We'll follow further recommend is from ENT. Continue antibiotics. Transfer the patient to [facility] once bed is available. Repeat a.m. labs

## 2017-06-18 ENCOUNTER — Ambulatory Visit: Payer: Medicaid Other | Admitting: Speech Pathology

## 2017-06-18 ENCOUNTER — Inpatient Hospital Stay (HOSPITAL_COMMUNITY): Payer: Medicaid Other

## 2017-06-18 ENCOUNTER — Encounter (HOSPITAL_COMMUNITY): Payer: Self-pay | Admitting: Otolaryngology

## 2017-06-18 LAB — C-REACTIVE PROTEIN: CRP: 9.7 mg/dL — AB (ref <1.0)

## 2017-06-18 LAB — BASIC METABOLIC PANEL
Anion gap: 9 (ref 5–15)
BUN: 7 mg/dL (ref 6–20)
CHLORIDE: 104 mmol/L (ref 101–111)
CO2: 25 mmol/L (ref 22–32)
Calcium: 8.9 mg/dL (ref 8.9–10.3)
Creatinine, Ser: 0.75 mg/dL (ref 0.61–1.24)
Glucose, Bld: 135 mg/dL — ABNORMAL HIGH (ref 65–99)
POTASSIUM: 4.3 mmol/L (ref 3.5–5.1)
SODIUM: 138 mmol/L (ref 135–145)

## 2017-06-18 MED ORDER — LEVETIRACETAM 500 MG/5ML IV SOLN
500.0000 mg | Freq: Two times a day (BID) | INTRAVENOUS | Status: DC
  Administered 2017-06-18: 500 mg via INTRAVENOUS
  Filled 2017-06-18 (×2): qty 5

## 2017-06-18 NOTE — Progress Notes (Signed)
Pt refuses to keep cardiac and O2 monitor on. Educated pt on need for it but he still refuses. MD notified.

## 2017-06-18 NOTE — Care Management Note (Signed)
Case Management Note Marvetta Gibbons RN, BSN Unit 4E-Case Manager [phone]  Patient Details  Name: Arthur Beasley MRN: [id number] Date of Birth: [birth year]  Subjective/Objective:    Pt admitted s/p I&D of parapharyngeal abscess                Action/Plan: PTA pt lived at home- independent- referral received regarding medication cost- spoke with pt and finance at bedside- per conversation pt pays $3 for medications does not have difficulty except sometimes with pain meds. Pt uses [facility].- Pt has Medicaid and would not qualify for [facility] assistance.   Expected Discharge Date:   (unknown)               Expected Discharge Plan:     In-House Referral:  NA  Discharge planning Services  CM Consult  Post Acute Care Choice:    Choice offered to:     DME Arranged:    DME Agency:     HH Arranged:    HH Agency:     Status of Service:  In process, will continue to follow  If discussed at Long Length of Stay Meetings, dates discussed:    Additional Comments:  Dawayne Patricia, RN 06/18/2017, 2:23 PM

## 2017-06-18 NOTE — Progress Notes (Signed)
     06/18/2017 1:53 PM  Glenard Haring [id number]  Post-Op Day 1    Temp:  [97.3 F (36.3 C)-98.5 F (36.9 C)] 98.5 F (36.9 C) (08/02 0521) Pulse Rate:  [33-106] 90 (08/02 0521) Resp:  [9-18] 18 (08/02 0521) BP: (131-182)/(85-132) 154/92 (08/02 1236) SpO2:  [88 %-100 %] 100 % (08/02 0521) Weight:  [47.2 kg (104 lb)-50.5 kg (111 lb 4.8 oz)] 50.5 kg (111 lb 4.8 oz) (08/01 2237),     Intake/Output Summary (Last 24 hours) at 06/18/17 1353 Last data filed at 06/18/17 0529  Gross per 24 hour  Intake             2200 ml  Output             1150 ml  Net             1050 ml    Results for orders placed or performed during the hospital encounter of 06/16/17 (from the past 24 hour(s))  Aerobic/Anaerobic Culture (surgical/deep wound)     Status: None (Preliminary result)   Collection Time: 06/17/17  7:47 PM  Result Value Ref Range   Specimen Description THROAT    Special Requests LEFT PARAPHARANGEAL ABSCESS    Gram Stain      MODERATE WBC PRESENT,BOTH PMN AND MONONUCLEAR RARE GRAM NEGATIVE RODS RARE GRAM POSITIVE COCCI IN PAIRS RARE GRAM POSITIVE RODS FEW SQUAMOUS EPITHELIAL CELLS PRESENT    Culture PENDING    Report Status PENDING   C-reactive protein     Status: Abnormal   Collection Time: 06/18/17 10:06 AM  Result Value Ref Range   CRP 9.7 (H) <1.0 mg/dL  Basic metabolic panel     Status: Abnormal   Collection Time: 06/18/17 10:06 AM  Result Value Ref Range   Sodium 138 135 - 145 mmol/L   Potassium 4.3 3.5 - 5.1 mmol/L   Chloride 104 101 - 111 mmol/L   CO2 25 22 - 32 mmol/L   Glucose, Bld 135 (H) 65 - 99 mg/dL   BUN 7 6 - 20 mg/dL   Creatinine, Ser 0.75 0.61 - 1.24 mg/dL   Calcium 8.9 8.9 - 10.3 mg/dL   GFR calc non Af Amer >60 >60 mL/min   GFR calc Af Amer >60 >60 mL/min   Anion gap 9 5 - 15   CT neck, no contrast:  Radio-opaque packing in cavity. Increased lucency in low parapharyngeal and retropharygeal tissues without obvious fluid collection.  Air in  wounds c/w recent surgery.   SUBJECTIVE:  Less pain, less trismus.  Swallowing some water.  Breathing well  OBJECTIVE:    Less trismus.  Packing removed.    IMPRESSION:   Satisfactory check  PLAN:  Advance diet.  Follow WBC.  When resuming po's well, can switch to po abx and send home.  Jodi Marble

## 2017-06-18 NOTE — Progress Notes (Signed)
Spoke with Dr. Sherral Hammers. Pt remains NPO. Changed Keppra to IV instead of PO while pt remains IV.    Fritz Pickerel, RN

## 2017-06-18 NOTE — Discharge Summary (Signed)
Physician Discharge Summary  CARZELL SALDIVAR YTM:[id number] DOB: January 25, 1981 DOA: 06/16/2017  PCP: Lucianne Lei, MD  Admit date: 06/16/2017 Discharge date: 06/18/2017  Time spent: 0 minutes  Recommendations for Outpatient Follow-up:  Left parapharyngeal and masticator space abscess -Care per surgery -Patient insisted on leaving AMA. Advised this was not in his best interest and could result in death. -Surgery was contacted and informed patient was going to leave AMA.   Signed:  Dia Crawford, MD [facility] [phone] pager

## 2017-06-18 NOTE — Progress Notes (Signed)
Pt attempting to leave AMA. MD paged. Security called.   Fritz Pickerel, RN

## 2017-06-18 NOTE — Progress Notes (Signed)
Informed surgeon pt wanted to leave AMA - reiterated that pt should leave smoke, but not coming to speak with patient. Re-informed pt he should not smoke. Continues to demand to leave AMA. Pt signed AMA form. Significant other at bedside - pt has no POA. Alert and oriented. Spoke with Dr. Sherral Hammers. Pt clear to sign own AMA form.   Fritz Pickerel, RN

## 2017-06-19 LAB — CULTURE, GROUP A STREP (THRC)

## 2017-06-19 NOTE — Anesthesia Postprocedure Evaluation (Signed)
Anesthesia Post Note  Patient: Arthur Beasley  Procedure(s) Performed: Procedure(s) (LRB): INCISION AND DRAINAGE ABSCESS TRANSORAL POSSIBLE EXTERNAL APPROACH (N/A)     Patient location during evaluation: PACU Anesthesia Type: General Level of consciousness: awake and alert and patient cooperative Pain management: pain level controlled Vital Signs Assessment: post-procedure vital signs reviewed and stable Respiratory status: spontaneous breathing and respiratory function stable Cardiovascular status: stable Anesthetic complications: no    Last Vitals:  Vitals:   06/18/17 0521 06/18/17 1236  BP: 132/87 (!) 154/92  Pulse: 90   Resp: 18   Temp: 36.9 C     Last Pain:  Vitals:   06/18/17 1250  TempSrc:   PainSc: [place]

## 2017-06-20 ENCOUNTER — Encounter (HOSPITAL_COMMUNITY): Payer: Self-pay

## 2017-06-20 ENCOUNTER — Emergency Department (HOSPITAL_COMMUNITY)
Admission: EM | Admit: 2017-06-20 | Discharge: 2017-06-21 | Disposition: A | Discharge status: Home / Self Care | Payer: Medicaid Other | Attending: Family Medicine | Admitting: Family Medicine

## 2017-06-20 DIAGNOSIS — J390 Retropharyngeal and parapharyngeal abscess: Secondary | ICD-10-CM | POA: Diagnosis not present

## 2017-06-20 DIAGNOSIS — W3400XA Accidental discharge from unspecified firearms or gun, initial encounter: Secondary | ICD-10-CM

## 2017-06-20 DIAGNOSIS — I10 Essential (primary) hypertension: Secondary | ICD-10-CM | POA: Insufficient documentation

## 2017-06-20 DIAGNOSIS — Z79899 Other long term (current) drug therapy: Secondary | ICD-10-CM | POA: Insufficient documentation

## 2017-06-20 DIAGNOSIS — F17210 Nicotine dependence, cigarettes, uncomplicated: Secondary | ICD-10-CM | POA: Diagnosis not present

## 2017-06-20 DIAGNOSIS — Z91040 Latex allergy status: Secondary | ICD-10-CM | POA: Diagnosis not present

## 2017-06-20 DIAGNOSIS — M542 Cervicalgia: Secondary | ICD-10-CM | POA: Diagnosis present

## 2017-06-20 DIAGNOSIS — F319 Bipolar disorder, unspecified: Secondary | ICD-10-CM | POA: Diagnosis present

## 2017-06-20 DIAGNOSIS — E876 Hypokalemia: Secondary | ICD-10-CM | POA: Diagnosis present

## 2017-06-20 LAB — BASIC METABOLIC PANEL
ANION GAP: 8 (ref 5–15)
BUN: 14 mg/dL (ref 6–20)
CHLORIDE: 103 mmol/L (ref 101–111)
CO2: 28 mmol/L (ref 22–32)
Calcium: 9.6 mg/dL (ref 8.9–10.3)
Creatinine, Ser: 0.96 mg/dL (ref 0.61–1.24)
GFR calc Af Amer: >60 mL/min (ref >60)
GFR calc non Af Amer: >60 mL/min (ref >60)
GLUCOSE: 81 mg/dL (ref 65–99)
POTASSIUM: 3.3 mmol/L — AB (ref 3.5–5.1)
Sodium: 139 mmol/L (ref 135–145)

## 2017-06-20 LAB — CBC WITH DIFFERENTIAL/PLATELET
Basophils Absolute: 0 10*3/uL (ref 0.0–0.1)
Basophils Relative: 0 %
Eosinophils Absolute: 0.1 10*3/uL (ref 0.0–0.7)
Eosinophils Relative: 2 %
HCT: 38.1 % — ABNORMAL LOW (ref 39.0–52.0)
HEMOGLOBIN: 12.5 g/dL — AB (ref 13.0–17.0)
LYMPHS ABS: 1.8 10*3/uL (ref 0.7–4.0)
LYMPHS PCT: 36 %
MCH: 26.7 pg (ref 26.0–34.0)
MCHC: 32.8 g/dL (ref 30.0–36.0)
MCV: 81.2 fL (ref 78.0–100.0)
Monocytes Absolute: 0.4 10*3/uL (ref 0.1–1.0)
Monocytes Relative: 8 %
NEUTROS ABS: 2.6 10*3/uL (ref 1.7–7.7)
NEUTROS PCT: 54 %
Platelets: 295 10*3/uL (ref 150–400)
RBC: 4.69 MIL/uL (ref 4.22–5.81)
RDW: 14.7 % (ref 11.5–15.5)
WBC: 4.9 10*3/uL (ref 4.0–10.5)

## 2017-06-20 LAB — I-STAT CG4 LACTIC ACID, ED: LACTIC ACID, VENOUS: 1.76 mmol/L (ref 0.5–1.9)

## 2017-06-20 MED ORDER — CLINDAMYCIN HCL 300 MG PO CAPS: 300.0000 mg | ORAL_CAPSULE | Freq: Three times a day (TID) | ORAL | 0 refills | Status: DC

## 2017-06-20 MED ORDER — OXYCODONE HCL 5 MG PO TABS: 5.0000 mg | ORAL_TABLET | Freq: Four times a day (QID) | ORAL | 0 refills | Status: DC | PRN

## 2017-06-20 MED ORDER — ONDANSETRON HCL 4 MG/2ML IJ SOLN
4.0000 mg | Freq: Once | INTRAMUSCULAR | Status: AC
  Administered 2017-06-20: 4 mg via INTRAVENOUS
  Filled 2017-06-20: qty 2

## 2017-06-20 MED ORDER — HYDROCODONE-ACETAMINOPHEN 5-325 MG PO TABS: 1.0000 | ORAL_TABLET | Freq: Four times a day (QID) | ORAL | 0 refills | Status: DC | PRN

## 2017-06-20 MED ORDER — SODIUM CHLORIDE 0.9 % IV BOLUS (SEPSIS)
1000.0000 mL | Freq: Once | INTRAVENOUS | Status: AC
  Administered 2017-06-20: 1000 mL via INTRAVENOUS

## 2017-06-20 MED ORDER — CLINDAMYCIN HCL 300 MG PO CAPS: 300.0000 mg | ORAL_CAPSULE | Freq: Four times a day (QID) | ORAL | 0 refills | Status: AC

## 2017-06-20 MED ORDER — MORPHINE SULFATE (PF) 4 MG/ML IV SOLN
4.0000 mg | Freq: Once | INTRAVENOUS | Status: AC
  Administered 2017-06-20: 4 mg via INTRAVENOUS
  Filled 2017-06-20: qty 1

## 2017-06-20 NOTE — Discharge Instructions (Signed)
Please take antibiotics as instructed. Return to ED with any worsening pain, swelling, drainage, difficulty breathing/swallowing, or any new or concerning symptoms. Call your ENT doctor for follow up first thing next week.

## 2017-06-20 NOTE — ED Triage Notes (Signed)
Patient had dental surgery for abscess on Tuesday and was admitted to hospital due to complexity of abscess. Signed out AMA prior to receiving antibiotics and pain meds. ,minimal swelling, complains of increased pain
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
## 2017-06-22 ENCOUNTER — Telehealth: Payer: Self-pay | Admitting: Physical Medicine & Rehabilitation

## 2017-06-22 NOTE — Telephone Encounter (Signed)
TFER FROM AM PTN FIANCE NATASHA ON THE PHONE DEMANDING TO SPEAK TO [place] ABOUT APPOINTMENT - I ADVISED I DO NOT SEE ANY RELEASE FOR ANYONE TO DISCUSS OR BOOK FOR PTN.  SHE STATES HE CANNOT SPEAK - I ASKED HER TO BRING HIM TO OFFICE TO SIGN RELEASE TO ALLOW [place] TO TALK TO HER.

## 2017-06-25 LAB — AEROBIC/ANAEROBIC CULTURE W GRAM STAIN (SURGICAL/DEEP WOUND)

## 2017-06-25 LAB — AEROBIC/ANAEROBIC CULTURE (SURGICAL/DEEP WOUND): CULTURE: NORMAL

## 2017-07-09 ENCOUNTER — Encounter (HOSPITAL_COMMUNITY): Payer: Self-pay | Admitting: Plastic Surgery

## 2017-07-10 ENCOUNTER — Other Ambulatory Visit (HOSPITAL_COMMUNITY): Payer: Self-pay | Admitting: Neurosurgery

## 2017-07-10 DIAGNOSIS — G936 Cerebral edema: Secondary | ICD-10-CM

## 2017-07-15 ENCOUNTER — Ambulatory Visit (HOSPITAL_COMMUNITY)
Admission: RE | Admit: 2017-07-15 | Discharge: 2017-07-15 | Disposition: A | Discharge status: Home / Self Care | Payer: Medicaid Other | Source: Ambulatory Visit | Attending: Neurosurgery | Admitting: Neurosurgery

## 2017-07-15 DIAGNOSIS — G936 Cerebral edema: Secondary | ICD-10-CM | POA: Insufficient documentation

## 2017-07-15 DIAGNOSIS — Z8673 Personal history of transient ischemic attack (TIA), and cerebral infarction without residual deficits: Secondary | ICD-10-CM | POA: Diagnosis not present

## 2017-07-15 MED ORDER — IOPAMIDOL (ISOVUE-300) INJECTION 61%
INTRAVENOUS | Status: AC
  Administered 2017-07-15: 75 mL via INTRAVENOUS
  Filled 2017-07-15: qty 75

## 2017-07-16 ENCOUNTER — Ambulatory Visit: Payer: Medicaid Other | Attending: Physical Medicine & Rehabilitation | Admitting: Speech Pathology

## 2017-07-16 ENCOUNTER — Ambulatory Visit: Payer: Medicaid Other | Admitting: Speech Pathology

## 2017-07-16 DIAGNOSIS — R482 Apraxia: Secondary | ICD-10-CM | POA: Insufficient documentation

## 2017-07-16 DIAGNOSIS — R4701 Aphasia: Secondary | ICD-10-CM | POA: Insufficient documentation

## 2017-07-16 DIAGNOSIS — R41841 Cognitive communication deficit: Secondary | ICD-10-CM
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
## 2017-07-24 ENCOUNTER — Ambulatory Visit: Payer: Medicaid Other | Attending: Physical Medicine & Rehabilitation

## 2017-07-24 DIAGNOSIS — R41841 Cognitive communication deficit: Secondary | ICD-10-CM | POA: Diagnosis present

## 2017-07-24 DIAGNOSIS — R482 Apraxia: Secondary | ICD-10-CM | POA: Diagnosis present

## 2017-07-24 DIAGNOSIS — R4701 Aphasia: Secondary | ICD-10-CM | POA: Diagnosis not present
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
## 2017-07-24 NOTE — Patient Instructions (Signed)
  Please complete the assigned speech therapy homework prior to your next session and return it to the speech therapist at your next visit.  

## 2017-07-28 ENCOUNTER — Ambulatory Visit: Payer: Medicaid Other | Admitting: Speech Pathology

## 2017-07-31 ENCOUNTER — Ambulatory Visit: Payer: Medicaid Other

## 2017-08-11 ENCOUNTER — Ambulatory Visit: Payer: Medicaid Other | Admitting: Speech Pathology

## 2017-08-13 ENCOUNTER — Ambulatory Visit: Payer: Medicaid Other | Admitting: Speech Pathology

## 2017-08-13 DIAGNOSIS — R4701 Aphasia: Secondary | ICD-10-CM | POA: Diagnosis not present

## 2017-08-13 DIAGNOSIS — R41841 Cognitive communication deficit: Secondary | ICD-10-CM

## 2017-08-13 DIAGNOSIS — R482 Apraxia: Secondary | ICD-10-CM

## 2017-08-13 NOTE — Patient Instructions (Signed)
  Practice in front of the mirror  [employer] a kiss  Sh  Blow your lips  Stick out your tongue and blow (raspberries)  La - mouth open wide, tongue behind your teeth  When you say or repeat something wrong, stop - practicing in wrong will not help
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
## 2017-08-18 ENCOUNTER — Ambulatory Visit: Payer: Medicaid Other

## 2017-08-18 ENCOUNTER — Encounter: Payer: Medicaid Other | Attending: Physical Medicine & Rehabilitation | Admitting: Physical Medicine & Rehabilitation

## 2017-08-18 ENCOUNTER — Encounter: Payer: Self-pay | Admitting: Physical Medicine & Rehabilitation

## 2017-08-18 VITALS — BP 142/65 | HR 64

## 2017-08-18 DIAGNOSIS — I63512 Cerebral infarction due to unspecified occlusion or stenosis of left middle cerebral artery: Secondary | ICD-10-CM | POA: Diagnosis present

## 2017-08-18 DIAGNOSIS — G8110 Spastic hemiplegia affecting unspecified side: Secondary | ICD-10-CM | POA: Diagnosis present

## 2017-08-18 MED ORDER — BACLOFEN 10 MG PO TABS: 10.0000 mg | ORAL_TABLET | Freq: Three times a day (TID) | ORAL | 3 refills | Status: DC
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
## 2017-08-18 NOTE — Patient Instructions (Signed)
BACLOFEN (FOR MUSCLE SPASM)  TAKE 10MG  TWICE DAILY FOR ONE WEEK THEN INCREASE TO THREE X DAILY AS IT READS ON THE BOTTLER   PLEASE FEEL FREE TO CALL OUR OFFICE WITH ANY PROBLEMS OR QUESTIONS ([phone])

## 2017-08-20 ENCOUNTER — Ambulatory Visit: Payer: Medicaid Other | Attending: Physical Medicine & Rehabilitation

## 2017-08-20 DIAGNOSIS — R4701 Aphasia: Secondary | ICD-10-CM | POA: Diagnosis present

## 2017-08-20 DIAGNOSIS — R482 Apraxia: Secondary | ICD-10-CM | POA: Insufficient documentation

## 2017-08-20 DIAGNOSIS — I69353 Hemiplegia and hemiparesis following cerebral infarction affecting right non-dominant side: Secondary | ICD-10-CM | POA: Insufficient documentation

## 2017-08-20 DIAGNOSIS — R29818 Other symptoms and signs involving the nervous system: Secondary | ICD-10-CM | POA: Diagnosis present

## 2017-08-20 DIAGNOSIS — I69318 Other symptoms and signs involving cognitive functions following cerebral infarction: Secondary | ICD-10-CM | POA: Insufficient documentation

## 2017-08-20 DIAGNOSIS — R29898 Other symptoms and signs involving the musculoskeletal system: Secondary | ICD-10-CM | POA: Insufficient documentation

## 2017-08-20 DIAGNOSIS — R41841 Cognitive communication deficit: Secondary | ICD-10-CM | POA: Diagnosis present
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
## 2017-08-24 ENCOUNTER — Ambulatory Visit: Payer: Medicaid Other

## 2017-08-25 ENCOUNTER — Encounter: Payer: Self-pay | Admitting: Occupational Therapy

## 2017-08-25 ENCOUNTER — Ambulatory Visit: Payer: Medicaid Other | Admitting: Occupational Therapy

## 2017-08-25 DIAGNOSIS — R29898 Other symptoms and signs involving the musculoskeletal system: Secondary | ICD-10-CM

## 2017-08-25 DIAGNOSIS — I69353 Hemiplegia and hemiparesis following cerebral infarction affecting right non-dominant side: Secondary | ICD-10-CM

## 2017-08-25 DIAGNOSIS — R4701 Aphasia: Secondary | ICD-10-CM | POA: Diagnosis not present

## 2017-08-25 DIAGNOSIS — R29818 Other symptoms and signs involving the nervous system: Secondary | ICD-10-CM

## 2017-08-25 DIAGNOSIS — I69318 Other symptoms and signs involving cognitive functions following cerebral infarction: Secondary | ICD-10-CM
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
## 2017-08-26 ENCOUNTER — Ambulatory Visit: Payer: Medicaid Other

## 2017-09-01 ENCOUNTER — Other Ambulatory Visit: Payer: Self-pay | Admitting: Neurosurgery

## 2017-09-15 ENCOUNTER — Ambulatory Visit: Payer: Medicaid Other | Admitting: Occupational Therapy

## 2017-09-15 ENCOUNTER — Ambulatory Visit: Payer: Medicaid Other

## 2017-09-17 ENCOUNTER — Encounter: Payer: Medicaid Other | Admitting: Occupational Therapy

## 2017-09-17 ENCOUNTER — Ambulatory Visit: Payer: Medicaid Other | Attending: Physical Medicine & Rehabilitation

## 2017-09-17 DIAGNOSIS — R29818 Other symptoms and signs involving the nervous system: Secondary | ICD-10-CM | POA: Diagnosis present

## 2017-09-17 DIAGNOSIS — I69318 Other symptoms and signs involving cognitive functions following cerebral infarction: Secondary | ICD-10-CM | POA: Insufficient documentation

## 2017-09-17 DIAGNOSIS — R41841 Cognitive communication deficit: Secondary | ICD-10-CM

## 2017-09-17 DIAGNOSIS — R29898 Other symptoms and signs involving the musculoskeletal system: Secondary | ICD-10-CM | POA: Insufficient documentation

## 2017-09-17 DIAGNOSIS — I69353 Hemiplegia and hemiparesis following cerebral infarction affecting right non-dominant side: Secondary | ICD-10-CM | POA: Insufficient documentation

## 2017-09-17 DIAGNOSIS — R4701 Aphasia: Secondary | ICD-10-CM | POA: Diagnosis present

## 2017-09-17 DIAGNOSIS — R482 Apraxia: Secondary | ICD-10-CM | POA: Diagnosis present
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
## 2017-09-23 ENCOUNTER — Ambulatory Visit: Payer: Medicaid Other | Admitting: Occupational Therapy

## 2017-09-23 ENCOUNTER — Ambulatory Visit: Payer: Medicaid Other

## 2017-09-24 NOTE — Pre-Procedure Instructions (Signed)
Arthur Beasley  09/24/2017      [id number] [name], [name] [address] Phone: [phone] Fax: [phone]    Your procedure is scheduled on November 14  Report to [facility] at [facility].  Call this number if you have problems the morning of surgery:  [phone]   Remember:  Do not eat food or drink liquids after midnight.  Continue all other medications as directed by your physician except follow these medication instructions before surgery   Take these medicines the morning of surgery with A SIP OF WATER  amLODipine (NORVASC)  baclofen (LIORESAL) levETIRAcetam (KEPPRA) Oxycodone HCl   7 days prior to surgery STOP taking any Aspirin (unless otherwise instructed by your surgeon), Aleve, Naproxen, Ibuprofen, Motrin, Advil, Goody's, BC's, all herbal medications, fish oil, and all vitamins  Follow physicians instructions about Plavix   Do not wear jewelry  Do not wear lotions, powders, or cologne, or deoderant.  Men may shave face and neck.  Do not bring valuables to the hospital.  [facility] is not responsible for any belongings or valuables.  Contacts, dentures or bridgework may not be worn into surgery.  Leave your suitcase in the car.  After surgery it may be brought to your room.  For patients admitted to the hospital, discharge time will be determined by your treatment team.  Patients discharged the day of surgery will not be allowed to drive home.    Special instructions:   [facility]- Preparing For Surgery  Before surgery, you can play an important role. Because skin is not sterile, your skin needs to be as free of germs as possible. You can reduce the number of germs on your skin by washing with CHG (chlorahexidine gluconate) Soap before surgery.  CHG is an antiseptic cleaner which kills germs and bonds with the skin to continue killing germs even after  washing.  Please do not use if you have an allergy to CHG or antibacterial soaps. If your skin becomes reddened/irritated stop using the CHG.  Do not shave (including legs and underarms) for at least 48 hours prior to first CHG shower. It is OK to shave your face.  Please follow these instructions carefully.   1. Shower the NIGHT BEFORE SURGERY and the MORNING OF SURGERY with CHG.   2. If you chose to wash your hair, wash your hair first as usual with your normal shampoo.  3. After you shampoo, rinse your hair and body thoroughly to remove the shampoo.  4. Use CHG as you would any other liquid soap. You can apply CHG directly to the skin and wash gently with a scrungie or a clean washcloth.   5. Apply the CHG Soap to your body ONLY FROM THE NECK DOWN.  Do not use on open wounds or open sores. Avoid contact with your eyes, ears, mouth and genitals (private parts). Wash Face and genitals (private parts)  with your normal soap.  6. Wash thoroughly, paying special attention to the area where your surgery will be performed.  7. Thoroughly rinse your body with warm water from the neck down.  8. DO NOT shower/wash with your normal soap after using and rinsing off the CHG Soap.  9. Pat yourself dry with a CLEAN TOWEL.  10. Wear CLEAN PAJAMAS to bed the night before surgery, wear comfortable clothes the morning of surgery  11. Place CLEAN SHEETS on your bed  the night of your first shower and DO NOT SLEEP WITH PETS.    Day of Surgery: Do not apply any deodorants/lotions. Please wear clean clothes to the hospital/surgery center.      Please read over the following fact sheets that you were given.

## 2017-09-25 ENCOUNTER — Other Ambulatory Visit: Payer: Self-pay

## 2017-09-25 ENCOUNTER — Encounter (HOSPITAL_COMMUNITY)
Admission: RE | Admit: 2017-09-25 | Discharge: 2017-09-25 | Disposition: A | Discharge status: Home / Self Care | Payer: Medicaid Other | Source: Ambulatory Visit | Attending: Neurosurgery | Admitting: Neurosurgery

## 2017-09-25 ENCOUNTER — Encounter (HOSPITAL_COMMUNITY): Payer: Self-pay

## 2017-09-25 DIAGNOSIS — F17200 Nicotine dependence, unspecified, uncomplicated: Secondary | ICD-10-CM | POA: Insufficient documentation

## 2017-09-25 DIAGNOSIS — F319 Bipolar disorder, unspecified: Secondary | ICD-10-CM | POA: Insufficient documentation

## 2017-09-25 DIAGNOSIS — I10 Essential (primary) hypertension: Secondary | ICD-10-CM | POA: Insufficient documentation

## 2017-09-25 DIAGNOSIS — Z01812 Encounter for preprocedural laboratory examination: Secondary | ICD-10-CM | POA: Diagnosis not present

## 2017-09-25 DIAGNOSIS — Z8673 Personal history of transient ischemic attack (TIA), and cerebral infarction without residual deficits: Secondary | ICD-10-CM | POA: Insufficient documentation

## 2017-09-25 HISTORY — DX: Accidental discharge from unspecified firearms or gun, initial encounter: W34.00XA

## 2017-09-25 HISTORY — DX: Cerebral infarction, unspecified: I63.9

## 2017-09-25 HISTORY — DX: Unspecified speech disturbances: R47.9

## 2017-09-25 LAB — CBC
HEMATOCRIT: 44.2 % (ref 39.0–52.0)
HEMOGLOBIN: 14.8 g/dL (ref 13.0–17.0)
MCH: 29.1 pg (ref 26.0–34.0)
MCHC: 33.5 g/dL (ref 30.0–36.0)
MCV: 86.8 fL (ref 78.0–100.0)
Platelets: 195 10*3/uL (ref 150–400)
RBC: 5.09 MIL/uL (ref 4.22–5.81)
RDW: 13.3 % (ref 11.5–15.5)
WBC: 4.4 10*3/uL (ref 4.0–10.5)

## 2017-09-25 LAB — BASIC METABOLIC PANEL
ANION GAP: 6 (ref 5–15)
BUN: 8 mg/dL (ref 6–20)
CHLORIDE: 106 mmol/L (ref 101–111)
CO2: 29 mmol/L (ref 22–32)
Calcium: 9.2 mg/dL (ref 8.9–10.3)
Creatinine, Ser: 0.96 mg/dL (ref 0.61–1.24)
GFR calc non Af Amer: >60 mL/min (ref >60)
GLUCOSE: 95 mg/dL (ref 65–99)
POTASSIUM: 3.3 mmol/L — AB (ref 3.5–5.1)
Sodium: 141 mmol/L (ref 135–145)

## 2017-09-25 LAB — TYPE AND SCREEN
ABO/RH(D): O POS
ANTIBODY SCREEN: NEGATIVE

## 2017-09-25 LAB — ABO/RH: ABO/RH(D): O POS

## 2017-09-25 NOTE — Progress Notes (Signed)
PCP - denies Cardiologist -denies Neurologist - Arthur Beasley   Chest x-ray - 06/16/17 EKG - 05/08/17 Stress Test -  ECHO - 01/2017 Cardiac Cath - denies  Levada Dy came to see patient because patient and wife stated that the patient had 2 seizures about 3 weeks ago EMS was called and stated that he was ok and that he did not need medical attention Patient's  Wife said that he was shaking while laying in the bed what that happened.  Patient and wife stated that they were instructed take the last dose of plavix 09/29/17.  Levada Dy is touching base with Dr. Christella Noa office  Patient denies shortness of breath, fever, cough and chest pain at PAT appointment   Patient verbalized understanding of instructions that were given to them at the PAT appointment. Patient was also instructed that they will need to review over the PAT instructions again at home before surgery.
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
## 2017-09-28 ENCOUNTER — Telehealth: Payer: Self-pay | Admitting: Physical Medicine & Rehabilitation

## 2017-09-28 NOTE — Telephone Encounter (Signed)
Just an FYI, the patient's spouse phoned and wanted you to know that the patient had a seizure about three weeks ago. She stated that the Pt was having a procedure to "get a plate put in." Spouse also wanted to know if paperwork was sent in for a home health nurse. Please advise.

## 2017-09-29 ENCOUNTER — Ambulatory Visit: Payer: Medicaid Other | Admitting: Speech Pathology

## 2017-09-29 ENCOUNTER — Encounter: Payer: Self-pay | Admitting: Occupational Therapy

## 2017-09-29 ENCOUNTER — Ambulatory Visit: Payer: Medicaid Other | Admitting: Occupational Therapy

## 2017-09-29 ENCOUNTER — Encounter: Payer: Self-pay | Admitting: Speech Pathology

## 2017-09-29 ENCOUNTER — Encounter: Payer: Medicaid Other | Admitting: Occupational Therapy

## 2017-09-29 DIAGNOSIS — R29818 Other symptoms and signs involving the nervous system: Secondary | ICD-10-CM

## 2017-09-29 DIAGNOSIS — R482 Apraxia: Secondary | ICD-10-CM

## 2017-09-29 DIAGNOSIS — R29898 Other symptoms and signs involving the musculoskeletal system: Secondary | ICD-10-CM

## 2017-09-29 DIAGNOSIS — I69353 Hemiplegia and hemiparesis following cerebral infarction affecting right non-dominant side: Secondary | ICD-10-CM

## 2017-09-29 DIAGNOSIS — R4701 Aphasia: Secondary | ICD-10-CM

## 2017-09-29 DIAGNOSIS — I69318 Other symptoms and signs involving cognitive functions following cerebral infarction: Secondary | ICD-10-CM
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
## 2017-09-29 NOTE — Patient Instructions (Signed)
Your Splint This splint should initially be fitted by a healthcare practitioner.  The healthcare practitioner is responsible for providing wearing instructions and precautions to the patient, other healthcare practitioners and care provider involved in the patient's care.  This splint was custom made for you. Please read the following instructions to learn about wearing and caring for your splint.  Precautions Should your splint cause any of the following problems, remove the splint immediately and contact your therapist/physician.  Swelling  Severe Pain  Pressure Areas  Stiffness  Numbness  Do not wear your splint while operating machinery unless it has been fabricated for that purpose.  When To Wear Your Splint Where your splint according to your therapist/physician instructions.  How to wear your splint: Day one (Wednesday):   Wear 2 hours in the morning, take it off and then 2 hours in the evening.  Do NOT sleep in it yet. Day two (Thursday):       Wear 3 hours in the  Morning, take it off and then 3 hours in the evening.  Do NOT sleep in it yet Day three (Friday):    Wear 4 hours in the  Morning, take it off and then 4 hours in the evening.  Do NOT sleep in it yet. Day four (Saturday):  Do NOT wear it all during the day.  You can now sleep in your splint all night.   Taking Care off Your Splint 1. Keep your splint away from open flames. 2. Your splint will lose its shape in temperatures over 135 degrees Farenheit, ( in car windows, near radiators, ovens or in hot water).  Never make any adjustments to your splint, if the splint needs adjusting remove it and make an appointment to see your therapist. 3. Your splint, including the cushion liner may be cleaned with soap and lukewarm water.  Do not immerse in hot water over 135 degrees Farenheit. 4. Straps may be washed with soap and water, but do not moisten the self-adhesive portion. 5. For ink or hard to remove spots use a  scouring cleanser which contains chlorine.  Rinse the splint thoroughly after using chlorine cleanser.

## 2017-09-30 ENCOUNTER — Inpatient Hospital Stay (HOSPITAL_COMMUNITY): Payer: Medicaid Other | Admitting: Certified Registered Nurse Anesthetist

## 2017-09-30 ENCOUNTER — Inpatient Hospital Stay (HOSPITAL_COMMUNITY)
Admission: RE | Admit: 2017-09-30 | Discharge: 2017-10-01 | DRG: 026 | Disposition: A | Discharge status: Home / Self Care | Payer: Medicaid Other | Source: Ambulatory Visit | Attending: Neurosurgery | Admitting: Neurosurgery

## 2017-09-30 ENCOUNTER — Encounter (HOSPITAL_COMMUNITY): Payer: Self-pay | Admitting: Certified Registered Nurse Anesthetist

## 2017-09-30 ENCOUNTER — Encounter: Payer: Medicaid Other | Admitting: Physical Medicine & Rehabilitation

## 2017-09-30 ENCOUNTER — Encounter (HOSPITAL_COMMUNITY)
Admission: RE | Disposition: A | Discharge status: Home / Self Care | Payer: Self-pay | Source: Ambulatory Visit | Attending: Neurosurgery

## 2017-09-30 ENCOUNTER — Inpatient Hospital Stay (HOSPITAL_COMMUNITY): Payer: Medicaid Other | Admitting: Emergency Medicine

## 2017-09-30 DIAGNOSIS — Z880 Allergy status to penicillin: Secondary | ICD-10-CM | POA: Diagnosis not present

## 2017-09-30 DIAGNOSIS — F319 Bipolar disorder, unspecified: Secondary | ICD-10-CM | POA: Diagnosis present

## 2017-09-30 DIAGNOSIS — G936 Cerebral edema: Secondary | ICD-10-CM | POA: Diagnosis present

## 2017-09-30 DIAGNOSIS — I10 Essential (primary) hypertension: Secondary | ICD-10-CM | POA: Diagnosis present

## 2017-09-30 DIAGNOSIS — I69920 Aphasia following unspecified cerebrovascular disease: Secondary | ICD-10-CM

## 2017-09-30 DIAGNOSIS — I69951 Hemiplegia and hemiparesis following unspecified cerebrovascular disease affecting right dominant side: Secondary | ICD-10-CM

## 2017-09-30 DIAGNOSIS — F17210 Nicotine dependence, cigarettes, uncomplicated: Secondary | ICD-10-CM | POA: Diagnosis present

## 2017-09-30 DIAGNOSIS — Z823 Family history of stroke: Secondary | ICD-10-CM | POA: Diagnosis not present

## 2017-09-30 DIAGNOSIS — I639 Cerebral infarction, unspecified: Secondary | ICD-10-CM | POA: Diagnosis present

## 2017-09-30 DIAGNOSIS — I63412 Cerebral infarction due to embolism of left middle cerebral artery: Secondary | ICD-10-CM | POA: Diagnosis present

## 2017-09-30 HISTORY — PX: CRANIOPLASTY: SHX1407

## 2017-09-30 LAB — MRSA PCR SCREENING: MRSA BY PCR: POSITIVE — AB

## 2017-09-30 SURGERY — CRANIOPLASTY
Anesthesia: General

## 2017-09-30 MED ORDER — PROMETHAZINE HCL 12.5 MG PO TABS
12.5000 mg | ORAL_TABLET | ORAL | Status: DC | PRN
  Filled 2017-09-30: qty 2

## 2017-09-30 MED ORDER — VANCOMYCIN HCL IN DEXTROSE 1-5 GM/200ML-% IV SOLN
INTRAVENOUS | Status: AC
  Filled 2017-09-30: qty 200

## 2017-09-30 MED ORDER — LABETALOL HCL 5 MG/ML IV SOLN
INTRAVENOUS | Status: DC | PRN
  Administered 2017-09-30: 5 mg via INTRAVENOUS

## 2017-09-30 MED ORDER — 0.9 % SODIUM CHLORIDE (POUR BTL) OPTIME
TOPICAL | Status: DC | PRN
  Administered 2017-09-30 (×2): 1000 mL

## 2017-09-30 MED ORDER — MIDAZOLAM HCL 2 MG/2ML IJ SOLN
INTRAMUSCULAR | Status: AC
  Filled 2017-09-30: qty 2

## 2017-09-30 MED ORDER — MIDAZOLAM HCL 5 MG/5ML IJ SOLN
INTRAMUSCULAR | Status: DC | PRN
  Administered 2017-09-30: 2 mg via INTRAVENOUS

## 2017-09-30 MED ORDER — PHENYLEPHRINE HCL 10 MG/ML IJ SOLN
INTRAMUSCULAR | Status: DC | PRN
  Administered 2017-09-30: 40 ug via INTRAVENOUS

## 2017-09-30 MED ORDER — FENTANYL CITRATE (PF) 250 MCG/5ML IJ SOLN
INTRAMUSCULAR | Status: AC
  Filled 2017-09-30: qty 5

## 2017-09-30 MED ORDER — LABETALOL HCL 5 MG/ML IV SOLN
INTRAVENOUS | Status: AC
  Filled 2017-09-30: qty 8

## 2017-09-30 MED ORDER — LIDOCAINE 2% (20 MG/ML) 5 ML SYRINGE
INTRAMUSCULAR | Status: AC
  Filled 2017-09-30: qty 5

## 2017-09-30 MED ORDER — ONDANSETRON HCL 4 MG PO TABS: 4.0000 mg | ORAL_TABLET | ORAL | Status: DC | PRN

## 2017-09-30 MED ORDER — PROPOFOL 10 MG/ML IV BOLUS
INTRAVENOUS | Status: DC | PRN
  Administered 2017-09-30: 120 mg via INTRAVENOUS
  Administered 2017-09-30: 40 mg via INTRAVENOUS

## 2017-09-30 MED ORDER — ONDANSETRON HCL 4 MG/2ML IJ SOLN
INTRAMUSCULAR | Status: AC
Start: 2017-09-30 — End: 2017-09-30
  Filled 2017-09-30: qty 2

## 2017-09-30 MED ORDER — ACETAMINOPHEN 650 MG RE SUPP: 650.0000 mg | RECTAL | Status: DC | PRN

## 2017-09-30 MED ORDER — MORPHINE SULFATE (PF) 2 MG/ML IV SOLN: 1.0000 mg | INTRAVENOUS | Status: DC | PRN

## 2017-09-30 MED ORDER — MICROFIBRILLAR COLL HEMOSTAT EX PADS
MEDICATED_PAD | CUTANEOUS | Status: DC | PRN
  Administered 2017-09-30: 1 via TOPICAL

## 2017-09-30 MED ORDER — CHLORHEXIDINE GLUCONATE CLOTH 2 % EX PADS: 6.0000 | MEDICATED_PAD | Freq: Once | CUTANEOUS | Status: DC

## 2017-09-30 MED ORDER — POTASSIUM CHLORIDE IN NACL 20-0.9 MEQ/L-% IV SOLN
INTRAVENOUS | Status: DC
Start: 2017-09-30 — End: 2017-10-01
  Administered 2017-09-30: 15:00:00 via INTRAVENOUS
  Filled 2017-09-30 (×3): qty 1000

## 2017-09-30 MED ORDER — SENNOSIDES-DOCUSATE SODIUM 8.6-50 MG PO TABS: 1.0000 | ORAL_TABLET | Freq: Every evening | ORAL | Status: DC | PRN

## 2017-09-30 MED ORDER — MORPHINE SULFATE (PF) 4 MG/ML IV SOLN
1.0000 mg | INTRAVENOUS | Status: DC | PRN
  Administered 2017-09-30: 2 mg via INTRAVENOUS
  Filled 2017-09-30: qty 1

## 2017-09-30 MED ORDER — DOCUSATE SODIUM 100 MG PO CAPS
100.0000 mg | ORAL_CAPSULE | Freq: Two times a day (BID) | ORAL | Status: DC
  Administered 2017-09-30 – 2017-10-01 (×2): 100 mg via ORAL
  Filled 2017-09-30 (×2): qty 1

## 2017-09-30 MED ORDER — LABETALOL HCL 5 MG/ML IV SOLN
INTRAVENOUS | Status: AC
  Filled 2017-09-30: qty 4

## 2017-09-30 MED ORDER — LACTATED RINGERS IV SOLN
INTRAVENOUS | Status: DC | PRN
  Administered 2017-09-30 (×2): via INTRAVENOUS

## 2017-09-30 MED ORDER — BACITRACIN ZINC 500 UNIT/GM EX OINT
TOPICAL_OINTMENT | CUTANEOUS | Status: AC
  Filled 2017-09-30: qty 28.35

## 2017-09-30 MED ORDER — PROPOFOL 10 MG/ML IV BOLUS
INTRAVENOUS | Status: AC
  Filled 2017-09-30: qty 20

## 2017-09-30 MED ORDER — ONDANSETRON HCL 4 MG/2ML IJ SOLN: 4.0000 mg | INTRAMUSCULAR | Status: DC | PRN

## 2017-09-30 MED ORDER — DEXAMETHASONE SODIUM PHOSPHATE 10 MG/ML IJ SOLN
INTRAMUSCULAR | Status: AC
  Filled 2017-09-30: qty 1

## 2017-09-30 MED ORDER — DEXAMETHASONE SODIUM PHOSPHATE 4 MG/ML IJ SOLN
INTRAMUSCULAR | Status: DC | PRN
  Administered 2017-09-30: 10 mg via INTRAVENOUS

## 2017-09-30 MED ORDER — HYDROCODONE-ACETAMINOPHEN 5-325 MG PO TABS
1.0000 | ORAL_TABLET | ORAL | Status: DC | PRN
  Administered 2017-09-30: 1 via ORAL
  Filled 2017-09-30: qty 1

## 2017-09-30 MED ORDER — ONDANSETRON HCL 4 MG/2ML IJ SOLN
INTRAMUSCULAR | Status: DC | PRN
  Administered 2017-09-30: 4 mg via INTRAVENOUS

## 2017-09-30 MED ORDER — CHLORHEXIDINE GLUCONATE CLOTH 2 % EX PADS
6.0000 | MEDICATED_PAD | Freq: Every day | CUTANEOUS | Status: DC
  Administered 2017-10-01: 6 via TOPICAL

## 2017-09-30 MED ORDER — AMLODIPINE BESYLATE 5 MG PO TABS
5.0000 mg | ORAL_TABLET | Freq: Every day | ORAL | Status: DC
Start: 2017-09-30 — End: 2017-10-01
  Administered 2017-09-30 – 2017-10-01 (×2): 5 mg via ORAL
  Filled 2017-09-30 (×2): qty 1

## 2017-09-30 MED ORDER — BACLOFEN 10 MG PO TABS
10.0000 mg | ORAL_TABLET | Freq: Three times a day (TID) | ORAL | Status: DC
  Administered 2017-09-30 – 2017-10-01 (×3): 10 mg via ORAL
  Filled 2017-09-30 (×3): qty 1

## 2017-09-30 MED ORDER — FENTANYL CITRATE (PF) 100 MCG/2ML IJ SOLN
INTRAMUSCULAR | Status: DC | PRN
  Administered 2017-09-30: 50 ug via INTRAVENOUS
  Administered 2017-09-30 (×2): 100 ug via INTRAVENOUS
  Administered 2017-09-30 (×2): 50 ug via INTRAVENOUS
  Administered 2017-09-30: 100 ug via INTRAVENOUS
  Administered 2017-09-30: 50 ug via INTRAVENOUS

## 2017-09-30 MED ORDER — VANCOMYCIN HCL IN DEXTROSE 1-5 GM/200ML-% IV SOLN
1000.0000 mg | INTRAVENOUS | Status: AC
  Administered 2017-09-30: 1000 mg via INTRAVENOUS

## 2017-09-30 MED ORDER — ROCURONIUM BROMIDE 10 MG/ML (PF) SYRINGE
PREFILLED_SYRINGE | INTRAVENOUS | Status: AC
  Filled 2017-09-30: qty 5

## 2017-09-30 MED ORDER — PHENYLEPHRINE 40 MCG/ML (10ML) SYRINGE FOR IV PUSH (FOR BLOOD PRESSURE SUPPORT)
PREFILLED_SYRINGE | INTRAVENOUS | Status: AC
  Filled 2017-09-30: qty 10

## 2017-09-30 MED ORDER — LEVETIRACETAM 500 MG PO TABS
500.0000 mg | ORAL_TABLET | Freq: Two times a day (BID) | ORAL | Status: DC
  Administered 2017-09-30 – 2017-10-01 (×3): 500 mg via ORAL
  Filled 2017-09-30 (×3): qty 1

## 2017-09-30 MED ORDER — THROMBIN (RECOMBINANT) 20000 UNITS EX SOLR
CUTANEOUS | Status: AC
  Filled 2017-09-30: qty 20000

## 2017-09-30 MED ORDER — LABETALOL HCL 5 MG/ML IV SOLN
10.0000 mg | INTRAVENOUS | Status: DC | PRN
  Administered 2017-09-30 (×2): 10 mg via INTRAVENOUS
  Filled 2017-09-30: qty 4

## 2017-09-30 MED ORDER — OXYCODONE HCL 5 MG PO TABS
10.0000 mg | ORAL_TABLET | Freq: Three times a day (TID) | ORAL | Status: DC
  Administered 2017-09-30 – 2017-10-01 (×3): 10 mg via ORAL
  Filled 2017-09-30 (×3): qty 2

## 2017-09-30 MED ORDER — LIDOCAINE HCL (CARDIAC) 20 MG/ML IV SOLN
INTRAVENOUS | Status: DC | PRN
  Administered 2017-09-30: 80 mg via INTRAVENOUS

## 2017-09-30 MED ORDER — THROMBIN (RECOMBINANT) 20000 UNITS EX SOLR
CUTANEOUS | Status: DC | PRN
  Administered 2017-09-30: 20000 [IU] via TOPICAL

## 2017-09-30 MED ORDER — NALOXONE HCL 0.4 MG/ML IJ SOLN
0.0800 mg | INTRAMUSCULAR | Status: DC | PRN
Start: 2017-09-30 — End: 2017-10-01

## 2017-09-30 MED ORDER — ROCURONIUM BROMIDE 100 MG/10ML IV SOLN
INTRAVENOUS | Status: DC | PRN
  Administered 2017-09-30: 30 mg via INTRAVENOUS
  Administered 2017-09-30: 50 mg via INTRAVENOUS

## 2017-09-30 MED ORDER — SUGAMMADEX SODIUM 200 MG/2ML IV SOLN
INTRAVENOUS | Status: DC | PRN
  Administered 2017-09-30: 200 mg via INTRAVENOUS

## 2017-09-30 MED ORDER — ACETAMINOPHEN 325 MG PO TABS: 650.0000 mg | ORAL_TABLET | ORAL | Status: DC | PRN

## 2017-09-30 MED ORDER — LIDOCAINE-EPINEPHRINE 0.5 %-1:200000 IJ SOLN
INTRAMUSCULAR | Status: AC
  Filled 2017-09-30: qty 1

## 2017-09-30 MED ORDER — MUPIROCIN 2 % EX OINT
1.0000 "application " | TOPICAL_OINTMENT | Freq: Two times a day (BID) | CUTANEOUS | Status: DC
  Administered 2017-09-30 – 2017-10-01 (×3): 1 via NASAL
  Filled 2017-09-30 (×2): qty 22

## 2017-09-30 MED ORDER — LIDOCAINE-EPINEPHRINE 0.5 %-1:200000 IJ SOLN
INTRAMUSCULAR | Status: DC | PRN
  Administered 2017-09-30: 6 mL via INTRADERMAL

## 2017-09-30 SURGICAL SUPPLY — 82 items
ADH SKN CLS APL DERMABOND .7 (GAUZE/BANDAGES/DRESSINGS)
BANDAGE GAUZE 4  KLING STR (GAUZE/BANDAGES/DRESSINGS) IMPLANT
BLADE 10 SAFETY STRL DISP (BLADE) ×4 IMPLANT
BLADE CLIPPER SURG (BLADE) IMPLANT
BNDG CMPR 75X41 PLY HI ABS (GAUZE/BANDAGES/DRESSINGS) ×1
BNDG GAUZE ELAST 4 BULKY (GAUZE/BANDAGES/DRESSINGS) ×4 IMPLANT
BNDG STRETCH 4X75 STRL LF (GAUZE/BANDAGES/DRESSINGS) ×2 IMPLANT
CABLE BIPOLOR RESECTION CORD (MISCELLANEOUS) ×3 IMPLANT
CANISTER SUCT 3000ML PPV (MISCELLANEOUS) ×3 IMPLANT
CARTRIDGE OIL MAESTRO DRILL (MISCELLANEOUS) ×1 IMPLANT
CLIP RANEY DISP (INSTRUMENTS) ×4 IMPLANT
COVER BACK TABLE 60X90IN (DRAPES) IMPLANT
DECANTER SPIKE VIAL GLASS SM (MISCELLANEOUS) ×3 IMPLANT
DERMABOND ADVANCED (GAUZE/BANDAGES/DRESSINGS)
DERMABOND ADVANCED .7 DNX12 (GAUZE/BANDAGES/DRESSINGS) IMPLANT
DIFFUSER DRILL AIR PNEUMATIC (MISCELLANEOUS) ×3 IMPLANT
DRAPE NEUROLOGICAL W/INCISE (DRAPES) ×3 IMPLANT
DRAPE WARM FLUID 44X44 (DRAPE) ×3 IMPLANT
DURAPREP 6ML APPLICATOR 50/CS (WOUND CARE) ×3 IMPLANT
ELECT CAUTERY BLADE 6.4 (BLADE) ×3 IMPLANT
ELECT REM PT RETURN 9FT ADLT (ELECTROSURGICAL) ×3
ELECTRODE REM PT RTRN 9FT ADLT (ELECTROSURGICAL) ×1 IMPLANT
GAUZE SPONGE 4X4 12PLY STRL (GAUZE/BANDAGES/DRESSINGS) ×3 IMPLANT
GAUZE SPONGE 4X4 12PLY STRL LF (GAUZE/BANDAGES/DRESSINGS) ×2 IMPLANT
GAUZE SPONGE 4X4 16PLY XRAY LF (GAUZE/BANDAGES/DRESSINGS) IMPLANT
GLOVE BIO SURGEON STRL SZ 6.5 (GLOVE) IMPLANT
GLOVE BIO SURGEON STRL SZ7 (GLOVE) IMPLANT
GLOVE BIO SURGEON STRL SZ7.5 (GLOVE) IMPLANT
GLOVE BIO SURGEON STRL SZ8 (GLOVE) IMPLANT
GLOVE BIO SURGEON STRL SZ8.5 (GLOVE) IMPLANT
GLOVE BIO SURGEONS STRL SZ 6.5 (GLOVE)
GLOVE BIOGEL M 8.0 STRL (GLOVE) IMPLANT
GLOVE ECLIPSE 6.5 STRL STRAW (GLOVE) ×3 IMPLANT
GLOVE ECLIPSE 7.0 STRL STRAW (GLOVE) IMPLANT
GLOVE ECLIPSE 7.5 STRL STRAW (GLOVE) IMPLANT
GLOVE ECLIPSE 8.0 STRL XLNG CF (GLOVE) IMPLANT
GLOVE ECLIPSE 8.5 STRL (GLOVE) IMPLANT
GLOVE EXAM NITRILE LRG STRL (GLOVE) IMPLANT
GLOVE EXAM NITRILE XL STR (GLOVE) IMPLANT
GLOVE EXAM NITRILE XS STR PU (GLOVE) IMPLANT
GLOVE INDICATOR 6.5 STRL GRN (GLOVE) IMPLANT
GLOVE INDICATOR 7.0 STRL GRN (GLOVE) IMPLANT
GLOVE INDICATOR 7.5 STRL GRN (GLOVE) IMPLANT
GLOVE INDICATOR 8.0 STRL GRN (GLOVE) IMPLANT
GLOVE INDICATOR 8.5 STRL (GLOVE) IMPLANT
GLOVE OPTIFIT SS 8.0 STRL (GLOVE) IMPLANT
GLOVE SURG SS PI 6.5 STRL IVOR (GLOVE) ×2 IMPLANT
GLOVE SURG SS PI 7.5 STRL IVOR (GLOVE) ×6 IMPLANT
GLOVE SURG SS PI 8.0 STRL IVOR (GLOVE) ×2 IMPLANT
GLOVE SURG SS PI 8.5 STRL IVOR (GLOVE) ×2
GLOVE SURG SS PI 8.5 STRL STRW (GLOVE) IMPLANT
GOWN STRL REUS W/ TWL LRG LVL3 (GOWN DISPOSABLE) ×2 IMPLANT
GOWN STRL REUS W/ TWL XL LVL3 (GOWN DISPOSABLE) IMPLANT
GOWN STRL REUS W/TWL 2XL LVL3 (GOWN DISPOSABLE) IMPLANT
GOWN STRL REUS W/TWL LRG LVL3 (GOWN DISPOSABLE) ×9
GOWN STRL REUS W/TWL XL LVL3 (GOWN DISPOSABLE)
HEMOSTAT SURGICEL 2X14 (HEMOSTASIS) ×3 IMPLANT
HOOK DURA 1/2IN (MISCELLANEOUS) ×2 IMPLANT
KIT BASIN OR (CUSTOM PROCEDURE TRAY) ×3 IMPLANT
KIT ROOM TURNOVER OR (KITS) ×3 IMPLANT
MESH CRANIAL CUSTOM (Mesh General) ×2 IMPLANT
NDL HYPO 25X1 1.5 SAFETY (NEEDLE) ×1 IMPLANT
NEEDLE HYPO 25X1 1.5 SAFETY (NEEDLE) ×3 IMPLANT
NS IRRIG 1000ML POUR BTL (IV SOLUTION) ×3 IMPLANT
OIL CARTRIDGE MAESTRO DRILL (MISCELLANEOUS) ×3
PACK CRANIOTOMY (CUSTOM PROCEDURE TRAY) ×3 IMPLANT
PAD ARMBOARD 7.5X6 YLW CONV (MISCELLANEOUS) ×9 IMPLANT
PIN MAYFIELD SKULL DISP (PIN) IMPLANT
SCREW SELF DRILL HT 1.5/4MM (Screw) ×36 IMPLANT
SET CRAINOPLASTY (SET/KITS/TRAYS/PACK) ×3 IMPLANT
SPONGE SURGIFOAM ABS GEL 100 (HEMOSTASIS) IMPLANT
STAPLER SKIN PROX WIDE 3.9 (STAPLE) ×5 IMPLANT
STAPLER VISISTAT 35W (STAPLE) IMPLANT
SUT ETHILON 3 0 FSL (SUTURE) IMPLANT
SUT NURALON 4 0 TR CR/8 (SUTURE) IMPLANT
SUT VIC AB 2-0 CT2 18 VCP726D (SUTURE) ×9 IMPLANT
SYR CONTROL 10ML LL (SYRINGE) ×3 IMPLANT
TOWEL GREEN STERILE (TOWEL DISPOSABLE) ×3 IMPLANT
TOWEL GREEN STERILE FF (TOWEL DISPOSABLE) ×3 IMPLANT
TRAY FOLEY W/METER SILVER 16FR (SET/KITS/TRAYS/PACK) IMPLANT
UNDERPAD 30X30 (UNDERPADS AND DIAPERS) IMPLANT
WATER STERILE IRR 1000ML POUR (IV SOLUTION) ×3 IMPLANT

## 2017-09-30 NOTE — Progress Notes (Signed)
Pt surgical site on head is bleeding.  Surgeon notified.  Continue to reinforce dressing.  Will continue to monitor.

## 2017-09-30 NOTE — Transfer of Care (Signed)
Immediate Anesthesia Transfer of Care Note  Patient: Arthur Beasley  Procedure(s) Performed: Cranioplasty with cranial implant/bone flap replacement (N/A )  Patient Location: PACU  Anesthesia Type:General  Level of Consciousness: awake and alert   Airway & Oxygen Therapy: Patient Spontanous Breathing and Patient connected to nasal cannula oxygen  Post-op Assessment: Report given to RN and Post -op Vital signs reviewed and stable  Post vital signs: Reviewed and stable  Last Vitals:  Vitals:   09/30/17 0711 09/30/17 0712  BP: (!) 153/101 (!) 151/96  Pulse: 64   Resp: 18   Temp: (!) 36.4 C   SpO2: 100%     Last Pain:  Vitals:   09/30/17 0711  TempSrc: Oral         Complications: No apparent anesthesia complications

## 2017-09-30 NOTE — Telephone Encounter (Signed)
Left a message asking for a return call 

## 2017-09-30 NOTE — Anesthesia Procedure Notes (Signed)
Procedure Name: Intubation Date/Time: 09/30/2017 9:05 AM Performed by: Sophiarose Eades T, CRNA Pre-anesthesia Checklist: Patient identified, Emergency Drugs available, Suction available and Patient being monitored Patient Re-evaluated:Patient Re-evaluated prior to induction Oxygen Delivery Method: Circle system utilized Preoxygenation: Pre-oxygenation with 100% oxygen Induction Type: IV induction Ventilation: Mask ventilation without difficulty Laryngoscope Size: Mac and 4 Grade View: Grade III Tube type: Oral Tube size: 7.5 mm Number of attempts: 2 Airway Equipment and Method: Patient positioned with wedge pillow and Stylet Placement Confirmation: ETT inserted through vocal cords under direct vision,  positive ETCO2 and breath sounds checked- equal and bilateral Secured at: 22 cm Tube secured with: Tape Dental Injury: Teeth and Oropharynx as per pre-operative assessment  Difficulty Due To: Difficult Airway- due to anterior larynx and Difficulty was anticipated

## 2017-09-30 NOTE — Telephone Encounter (Signed)
He has been receiving outpt therapies. He is not eligible for home health. Perhaps his current admitting surgeon can help him with this.
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
## 2017-09-30 NOTE — Op Note (Signed)
09/30/2017  4:35 PM  PATIENT:  Arthur Beasley  37 y.o. 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 He is admitted for a cranioplasty  PRE-OPERATIVE DIAGNOSIS:  Cerebral edema  POST-OPERATIVE DIAGNOSIS:  Cerebral edema  PROCEDURE:  Procedure(s): Cranioplasty with cranial implant/bone flap replacement  SURGEON: Surgeon(s): Ashok Pall, MD Kary Kos, MD  ASSISTANTS:Cram, Dominica Severin  ANESTHESIA:   local, general and IV sedation  EBL:  Total I/O In: 1066.7 [I.V.:1066.7] Out: 1025 [Urine:725; Blood:300]  BLOOD ADMINISTERED:none  CELL SAVER GIVEN:per nursing  COUNT:per nursing  DRAINS: none   SPECIMEN:  No Specimen  DICTATION: MARC LEICHTER was taken to the operating room, intubated, and placed under a general anesthetic without difficulty. He was placed in a three pin [place] head holder once adequate anesthesia was obtained. His head was attached to the bed with a [place] adapter. His head was positioned turned towards the right with his neck in a neutral position exposing his incision on the left. I shaved his head and then it was prepped and draped in a sterile manner. I opened the incision with a 10 blade. I used the periosteal elevator to define the skull border. I also used curettes and monopolar cautery to define the bony edge. I used the knife to dissect the galea from the dural surface which was indistinct. I placed Raney clips on the scalp edges to control bleeding. I was able to fully release the scalp from the scar and dura. Once the bone was exposed Dr. Saintclair Halsted and I attached the custom mesh with screws. We then irrigated the wound and closed the incision. We approximated the galea, and then the scalp edges. I applied a sterile dressing. I removed his head from the pins, and he was moved to the stretcher. He was then extubated and brought to the PACU.   PLAN OF CARE: Admit to inpatient   PATIENT DISPOSITION:  PACU -  hemodynamically stable.   Delay start of Pharmacological VTE agent (>24hrs) due to surgical blood loss or risk of bleeding:  yes

## 2017-09-30 NOTE — Anesthesia Preprocedure Evaluation (Addendum)
Anesthesia Evaluation  Patient identified by MRN, date of birth, ID band Patient awake    Reviewed: Allergy & Precautions, NPO status , Patient's Chart, lab work & pertinent test results  Airway Mallampati: II  TM Distance: >3 FB Neck ROM: Full    Dental  (+) Teeth Intact, Dental Advisory Given   Pulmonary Current Smoker,    Pulmonary exam normal        Cardiovascular hypertension, Pt. on medications Normal cardiovascular exam     Neuro/Psych Depression Bipolar Disorder CVA    GI/Hepatic   Endo/Other    Renal/GU      Musculoskeletal   Abdominal   Peds  Hematology   Anesthesia Other Findings   Reproductive/Obstetrics                            Anesthesia Physical Anesthesia Plan  ASA: III  Anesthesia Plan: General   Post-op Pain Management:    Induction: Intravenous  PONV Risk Score and Plan: 1 and Treatment may vary due to age or medical condition  Airway Management Planned: Oral ETT  Additional Equipment:   Intra-op Plan:   Post-operative Plan: Extubation in OR  Informed Consent: I have reviewed the patients History and Physical, chart, labs and discussed the procedure including the risks, benefits and alternatives for the proposed anesthesia with the patient or authorized representative who has indicated his/her understanding and acceptance.     Plan Discussed with: CRNA and Surgeon  Anesthesia Plan Comments:         Anesthesia Quick Evaluation

## 2017-10-01 ENCOUNTER — Encounter (HOSPITAL_COMMUNITY): Payer: Self-pay | Admitting: Neurosurgery

## 2017-10-01 ENCOUNTER — Encounter: Payer: Medicaid Other | Admitting: Speech Pathology

## 2017-10-01 ENCOUNTER — Encounter: Payer: Medicaid Other | Admitting: Occupational Therapy

## 2017-10-01 NOTE — Telephone Encounter (Signed)
Patients wife left a message to please call back today (10/01/2017)

## 2017-10-01 NOTE — Discharge Summary (Signed)
Physician Discharge Summary  Patient ID: Arthur Beasley MRN: [id number] DOB/AGE: [birth year] 36 y.o.  Admit date: 09/30/2017 Discharge date: 10/01/2017  Admission Diagnoses:craniotomy defect  Discharge Diagnoses: same Active Problems:   Cerebral infarction due to embolism of left middle cerebral artery [facility])   Discharged Condition: good  Hospital Course: Arthur Beasley was admitted and taken to the operating room for an uncomplicated cranioplasty involving a custom mesh implant. Post op he is neurologically at his baseline. His dressing is intact. He is tolerating a regular diet, ambulating at baseline status. He is plegic on his right side, and has an aphasia.  Treatments: surgery: as above. Left cranioplasty custom mesh.  Discharge Exam: Blood pressure 123/89, pulse 92, temperature 98.4 F (36.9 C), temperature source Oral, resp. rate (!) 9, height 5\' 6"  (1.676 m), weight 50.8 kg (112 lb), SpO2 97 %. General appearance: alert and moderate distress  Disposition: 01-Home or Self Care Cerebral edema  Allergies as of 10/01/2017      Reactions   Latex Hives   Penicillins Itching   PATIENT HAD A PCN REACTION WITH IMMEDIATE RASH, FACIAL/TONGUE/THROAT SWELLING, SOB, OR LIGHTHEADEDNESS WITH HYPOTENSION:  #  #  #  YES  #  #  #   Has patient had a PCN reaction causing severe rash involving mucus membranes or skin necrosis: NO Has patient had a PCN reaction that required hospitalization NO Has patient had a PCN reaction occurring within the last 10 years: NO If all of the above answers are "NO", then may proceed with Cephalosporin use.      Medication List    TAKE these medications   amLODipine 5 MG tablet Commonly known as:  NORVASC Take 1 tablet (5 mg total) by mouth daily.   baclofen 10 MG tablet Commonly known as:  LIORESAL Take 1 tablet (10 mg total) by mouth 3 (three) times daily.   clopidogrel 75 MG tablet Commonly known as:  PLAVIX Take 1 tablet (75 mg total) by mouth  daily.   levETIRAcetam 500 MG tablet Commonly known as:  KEPPRA Take 1 tablet (500 mg total) by mouth 2 (two) times daily.   oxyCODONE 5 MG immediate release tablet Commonly known as:  ROXICODONE Take 1 tablet (5 mg total) by mouth every 6 (six) hours as needed for severe pain.   Oxycodone HCl 10 MG Tabs TAKE 1 TABLET BY MOUTH THREE TIMES DAILY      Follow-up Information    Ashok Pall, MD Follow up in 1 week(s).   Specialty:  Neurosurgery Why:  call to make an appointment for suture removal Contact information: [address] [place] [address] [phone]           Signed: Winfield Cunas 10/01/2017, 12:06 PM

## 2017-10-01 NOTE — Telephone Encounter (Signed)
Patient returned Ken's call.

## 2017-10-01 NOTE — Discharge Instructions (Addendum)
You may shower tomorrow. Take dressing off tomorrow.  Call if you have drainage from the wound.  Call if you temperature is greater than 101.5 You may resume your normal activities immediately Craniotomy Care After Please read the instructions outlined below and refer to this sheet in the next few weeks. These discharge instructions provide you with general information on caring for yourself after you leave the hospital. Your surgeon may also give you specific instructions. While your treatment has been planned according to the most current medical practices available, unavoidable complications occasionally occur. If you have any problems or questions after discharge, please call your surgeon. Although there are many types of brain surgery, recovery following craniotomy (surgical opening of the skull) is much the same for each. However, recovery depends on many factors. These include the type and severity of brain injury and the type of surgery. It also depends on any nervous system function problems (neurological deficits) before surgery. If the craniotomy was done for cancer, chemotherapy and radiation could follow. You could be in the hospital from 5 days to a couple weeks. This depends on the type of surgery, findings, and whether there are complications. HOME CARE INSTRUCTIONS   It is not unusual to hear a clicking noise after a craniotomy, the plates and screws used to attach the bone flap can sometimes cause this. It is a normal occurrence if this does happen  Do not drive for 10 days after the operation  Your scalp may feel spongy for a while, because of fluid under it. This will gradually get better. Occasionally, the surgeon will not replace the bone that was removed to access the brain. If there is a bony defect, the surgeon will ask you to wear a helmet for protection. This is a discussion you should have with your surgeon prior to leaving the hospital (discharge).  Numbness may persist  in some areas of your scalp.  Take all medications as directed. Sometimes steroids to control swelling are prescribed. Anticonvulsants to prevent seizures may also be given. Do not use alcohol, other drugs, or medications unless your surgeon says it is OK.  Keep the wound dry and clean. The wound may be washed gently with soap and water. Then, you may gently blot or dab it dry, without rubbing. Do not take baths, use swimming pools or hot tubs for 10 days, or as instructed by your caregiver. It is best to wait to see you surgeon at your first postoperative visit, and to get directions at that time.  Only take over-the-counter or prescription medicines for pain, discomfort, or fever as directed by your caregiver.  You may continue your normal diet, as directed.  Walking is OK for exercise. Wait at least 3 months before you return to mild, non-contact sports or as your surgeon suggests. Contact sports should be avoided for at least 1 year, unless your surgeon says it is OK.  If you are prescribed steroids, take them exactly as prescribed. If you start having a decrease in nervous system functions (neurological deficits) and headaches as the dose of steroids is reduced, tell your surgeon right away.  When the anticonvulsant prescription is finished you no longer need to take it. SEEK IMMEDIATE MEDICAL CARE IF:   You develop nausea, vomiting, severe headaches, confusion, or you have a seizure.  You develop chest pain, a stiff neck, or difficulty breathing.  There is redness, swelling, or increasing pain in the wound or pin insertion sites.  You have an increase  in swelling or bruising around the eyes.  There is drainage or pus coming from the wound.  You have an oral temperature above 102 F (38.9 C), not controlled by medicine.  You notice a foul smell coming from the wound or dressing.  The wound breaks open (edges not staying together) after the stitches have been removed.  You  develop dizziness or fainting while standing.  You develop a rash.  You develop any reaction or side effects to the medications given. Document Released: 02/03/2006 Document Revised: 01/26/2012 Document Reviewed: 11/12/2009 [facility] Patient Information 2013 [place].

## 2017-10-01 NOTE — Telephone Encounter (Signed)
Spoke with patient's wife and tried to explain that since the patient had graduated to outpatient therapies it is difficult to revert back to [facility] therapies.  She, of course, questioned, Why?.  Which I really do not have the answers.  I informed that Dr. Naaman Plummer advises family to call the surgeons office to secure [facility] orders for an aide and/or therapies. The surgery was completed.  She says that the surgeon  was under the impression that Dr. Naaman Plummer would be maintaining orders for home health.  She also reports that the patient had had a seizure 3 weeks ago and was wondering if Dr. Naaman Plummer was aware.  I did not see documentation.  So I told her, I would forward that back to him.

## 2017-10-01 NOTE — Anesthesia Postprocedure Evaluation (Signed)
Anesthesia Post Note  Patient: Arthur Beasley  Procedure(s) Performed: Cranioplasty with cranial implant/bone flap replacement (N/A )     Patient location during evaluation: PACU Anesthesia Type: General Level of consciousness: awake and alert Pain management: pain level controlled Vital Signs Assessment: post-procedure vital signs reviewed and stable Respiratory status: spontaneous breathing, nonlabored ventilation, respiratory function stable and patient connected to nasal cannula oxygen Cardiovascular status: blood pressure returned to baseline and stable Postop Assessment: no apparent nausea or vomiting Anesthetic complications: no    Last Vitals:  Vitals:   10/01/17 0700 10/01/17 0800  BP: (!) 137/96   Pulse:    Resp: 19 15  Temp:    SpO2:  100%    Last Pain:  Vitals:   10/01/17 0400  TempSrc: Oral  PainSc: 0-No pain                 Antwon Rochin DAVID

## 2017-10-02 NOTE — Telephone Encounter (Signed)
I attempted to call significant other twice. Spoke with Mancel Parsons re: specifics regarding meeting requirements to receive [facility] services.

## 2017-10-06 ENCOUNTER — Encounter: Payer: Medicaid Other | Attending: Physical Medicine & Rehabilitation | Admitting: Physical Medicine & Rehabilitation

## 2017-10-06 ENCOUNTER — Ambulatory Visit: Payer: Medicaid Other | Admitting: Occupational Therapy

## 2017-10-06 DIAGNOSIS — G8110 Spastic hemiplegia affecting unspecified side: Secondary | ICD-10-CM | POA: Insufficient documentation

## 2017-10-06 DIAGNOSIS — I63512 Cerebral infarction due to unspecified occlusion or stenosis of left middle cerebral artery: Secondary | ICD-10-CM | POA: Insufficient documentation

## 2017-10-07 ENCOUNTER — Telehealth: Payer: Self-pay | Admitting: *Deleted

## 2017-10-07 ENCOUNTER — Other Ambulatory Visit: Payer: Self-pay | Admitting: Physical Medicine & Rehabilitation

## 2017-10-12 ENCOUNTER — Encounter: Payer: Self-pay | Admitting: Occupational Therapy

## 2017-10-12 ENCOUNTER — Ambulatory Visit: Payer: Medicaid Other | Admitting: Occupational Therapy

## 2017-10-12 DIAGNOSIS — R29898 Other symptoms and signs involving the musculoskeletal system: Secondary | ICD-10-CM | POA: Diagnosis present

## 2017-10-12 DIAGNOSIS — R29818 Other symptoms and signs involving the nervous system: Secondary | ICD-10-CM | POA: Diagnosis present

## 2017-10-12 DIAGNOSIS — R41841 Cognitive communication deficit: Secondary | ICD-10-CM | POA: Diagnosis present

## 2017-10-12 DIAGNOSIS — I69353 Hemiplegia and hemiparesis following cerebral infarction affecting right non-dominant side: Secondary | ICD-10-CM

## 2017-10-12 DIAGNOSIS — R482 Apraxia: Secondary | ICD-10-CM | POA: Diagnosis not present

## 2017-10-12 DIAGNOSIS — R4701 Aphasia: Secondary | ICD-10-CM | POA: Diagnosis present

## 2017-10-12 DIAGNOSIS — I69318 Other symptoms and signs involving cognitive functions following cerebral infarction: Secondary | ICD-10-CM
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
## 2017-10-12 NOTE — Patient Instructions (Signed)
PERFORM ALL SLOWLY AND ONLY AS FAR AS YOU CAN WITHOUT PAIN!      Supine Reach    Use stronger arm to move involved arm up and overhead to get a stretch. Then slowly lower back.  Keep thumb up and elbow straight. Hold 5 seconds. Repeat 10 times. Do 1-2 sessions per day.    Elbow: Extension    Sit at table, Clasp hands and then slide arm across table using other arm to help if needed. Hold 5 seconds. Repeat 10 times   Hold 10 seconds. Repeat 10 times. Do 1-2 sessions per day. CAUTION: Stretch slowly and gently. Do not force joint.   Extension (Passive)    Using other hand, lift hand at wrist as far as possible. Hold 10 seconds. Repeat 10 times. Do 1-2 sessions per day.     FINGERS: Extension    Use opposite hand to straighten fingers. Do not bend knuckles backwards. Hold 10 seconds. 10 reps per set, 1-2 times per day    Supination (Passive)    Keep elbow bent at right angle and held firmly at side. Use other hand to turn forearm until palm faces upward. Hold 10 seconds. Repeat 10 times. Do 1-2 sessions per day.

## 2017-10-13 ENCOUNTER — Ambulatory Visit: Payer: Medicaid Other | Admitting: Occupational Therapy

## 2017-10-14 ENCOUNTER — Encounter: Payer: Self-pay | Admitting: Occupational Therapy

## 2017-10-14 ENCOUNTER — Ambulatory Visit: Payer: Medicaid Other | Admitting: Occupational Therapy

## 2017-10-14 DIAGNOSIS — I69318 Other symptoms and signs involving cognitive functions following cerebral infarction: Secondary | ICD-10-CM

## 2017-10-14 DIAGNOSIS — R482 Apraxia: Secondary | ICD-10-CM | POA: Diagnosis not present

## 2017-10-14 DIAGNOSIS — R29898 Other symptoms and signs involving the musculoskeletal system: Secondary | ICD-10-CM

## 2017-10-14 DIAGNOSIS — R29818 Other symptoms and signs involving the nervous system: Secondary | ICD-10-CM

## 2017-10-14 DIAGNOSIS — I69353 Hemiplegia and hemiparesis following cerebral infarction affecting right non-dominant side: Secondary | ICD-10-CM
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
## 2017-10-28 ENCOUNTER — Ambulatory Visit: Payer: Medicaid Other | Admitting: Occupational Therapy

## 2017-10-29 ENCOUNTER — Ambulatory Visit: Payer: Medicaid Other | Admitting: Occupational Therapy

## 2017-11-03 ENCOUNTER — Encounter: Payer: Medicaid Other | Admitting: Occupational Therapy

## 2017-11-12 ENCOUNTER — Encounter: Payer: Medicaid Other | Admitting: Occupational Therapy

## 2017-11-13 ENCOUNTER — Encounter: Payer: Medicaid Other | Admitting: Occupational Therapy

## 2017-11-19 ENCOUNTER — Encounter: Payer: Medicaid Other | Admitting: Occupational Therapy

## 2017-11-20 ENCOUNTER — Encounter: Payer: Medicaid Other | Admitting: Occupational Therapy

## 2017-11-24 ENCOUNTER — Encounter: Payer: Self-pay | Admitting: Occupational Therapy

## 2017-11-24 ENCOUNTER — Ambulatory Visit: Payer: Medicaid Other | Attending: Physical Medicine & Rehabilitation | Admitting: Occupational Therapy

## 2017-11-24 DIAGNOSIS — R29898 Other symptoms and signs involving the musculoskeletal system: Secondary | ICD-10-CM | POA: Insufficient documentation

## 2017-11-24 DIAGNOSIS — I69353 Hemiplegia and hemiparesis following cerebral infarction affecting right non-dominant side: Secondary | ICD-10-CM | POA: Diagnosis present

## 2017-11-24 DIAGNOSIS — I69318 Other symptoms and signs involving cognitive functions following cerebral infarction: Secondary | ICD-10-CM | POA: Insufficient documentation

## 2017-11-24 DIAGNOSIS — R29818 Other symptoms and signs involving the nervous system: Secondary | ICD-10-CM | POA: Diagnosis present

## 2017-11-24 NOTE — Patient Instructions (Signed)
   1.  Lay on back.  Hold empty shoebox on the sides with both hands (hands open).  Hold at chest then push up to ceiling slowly.  10x, 2x/day.                     2.  Lay on your back.  Hold empty shoe box with both hands.  Start with box on legs and elbows straight.  Slowly raise box up to chin.  Keep elbows straight.  10x, 2x/day.
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
## 2017-12-01 ENCOUNTER — Ambulatory Visit: Payer: Medicaid Other | Admitting: Occupational Therapy

## 2017-12-01 ENCOUNTER — Encounter: Payer: Self-pay | Admitting: Occupational Therapy

## 2017-12-01 DIAGNOSIS — I69353 Hemiplegia and hemiparesis following cerebral infarction affecting right non-dominant side: Secondary | ICD-10-CM | POA: Diagnosis not present

## 2017-12-01 DIAGNOSIS — R29898 Other symptoms and signs involving the musculoskeletal system: Secondary | ICD-10-CM

## 2017-12-01 DIAGNOSIS — I69318 Other symptoms and signs involving cognitive functions following cerebral infarction: Secondary | ICD-10-CM

## 2017-12-01 DIAGNOSIS — R29818 Other symptoms and signs involving the nervous system: Secondary | ICD-10-CM
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
## 2017-12-04 ENCOUNTER — Emergency Department (HOSPITAL_COMMUNITY)
Admission: EM | Admit: 2017-12-04 | Discharge: 2017-12-04 | Disposition: A | Discharge status: Home / Self Care | Payer: Medicaid Other | Attending: Emergency Medicine | Admitting: Emergency Medicine

## 2017-12-04 ENCOUNTER — Encounter (HOSPITAL_COMMUNITY): Payer: Self-pay | Admitting: Emergency Medicine

## 2017-12-04 DIAGNOSIS — R51 Headache: Secondary | ICD-10-CM | POA: Diagnosis present

## 2017-12-04 DIAGNOSIS — Z79899 Other long term (current) drug therapy: Secondary | ICD-10-CM | POA: Diagnosis not present

## 2017-12-04 DIAGNOSIS — Z7902 Long term (current) use of antithrombotics/antiplatelets: Secondary | ICD-10-CM | POA: Diagnosis not present

## 2017-12-04 DIAGNOSIS — G40909 Epilepsy, unspecified, not intractable, without status epilepticus: Secondary | ICD-10-CM | POA: Insufficient documentation

## 2017-12-04 DIAGNOSIS — F17210 Nicotine dependence, cigarettes, uncomplicated: Secondary | ICD-10-CM | POA: Insufficient documentation

## 2017-12-04 DIAGNOSIS — I10 Essential (primary) hypertension: Secondary | ICD-10-CM | POA: Diagnosis not present

## 2017-12-04 DIAGNOSIS — Z760 Encounter for issue of repeat prescription: Secondary | ICD-10-CM | POA: Diagnosis not present

## 2017-12-04 LAB — CBC
HCT: 42.3 % (ref 39.0–52.0)
Hemoglobin: 13.9 g/dL (ref 13.0–17.0)
MCH: 29.1 pg (ref 26.0–34.0)
MCHC: 32.9 g/dL (ref 30.0–36.0)
MCV: 88.5 fL (ref 78.0–100.0)
Platelets: 228 10*3/uL (ref 150–400)
RBC: 4.78 MIL/uL (ref 4.22–5.81)
RDW: 13.5 % (ref 11.5–15.5)
WBC: 4.2 10*3/uL (ref 4.0–10.5)

## 2017-12-04 LAB — BASIC METABOLIC PANEL
Anion gap: 9 (ref 5–15)
BUN: 9 mg/dL (ref 6–20)
CO2: 27 mmol/L (ref 22–32)
Calcium: 9.4 mg/dL (ref 8.9–10.3)
Chloride: 108 mmol/L (ref 101–111)
Creatinine, Ser: 1.09 mg/dL (ref 0.61–1.24)
GFR calc Af Amer: >60 mL/min (ref >60)
Glucose, Bld: 102 mg/dL — ABNORMAL HIGH (ref 65–99)
POTASSIUM: 4.4 mmol/L (ref 3.5–5.1)
Sodium: 144 mmol/L (ref 135–145)

## 2017-12-04 MED ORDER — LEVETIRACETAM 500 MG PO TABS: 500.0000 mg | ORAL_TABLET | Freq: Two times a day (BID) | ORAL | 1 refills | Status: DC

## 2017-12-04 MED ORDER — LEVETIRACETAM 500 MG PO TABS
500.0000 mg | ORAL_TABLET | Freq: Once | ORAL | Status: AC
  Administered 2017-12-04: 500 mg via ORAL
  Filled 2017-12-04: qty 1
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
## 2017-12-04 NOTE — Discharge Instructions (Signed)
Please return for any problem.  °

## 2017-12-08 NOTE — Telephone Encounter (Signed)
Dismissal letter was mailed 10/07/17.

## 2017-12-09 ENCOUNTER — Ambulatory Visit: Payer: Medicaid Other | Admitting: Occupational Therapy

## 2017-12-11 ENCOUNTER — Ambulatory Visit (HOSPITAL_COMMUNITY)
Admission: EM | Admit: 2017-12-11 | Discharge: 2017-12-11 | Discharge status: Absent without leave | Payer: Medicaid Other

## 2017-12-14 ENCOUNTER — Encounter (HOSPITAL_COMMUNITY): Payer: Self-pay | Admitting: Family Medicine

## 2017-12-14 ENCOUNTER — Ambulatory Visit (HOSPITAL_COMMUNITY)
Admission: EM | Admit: 2017-12-14 | Discharge: 2017-12-14 | Disposition: A | Discharge status: Home / Self Care | Payer: Medicaid Other | Attending: Family Medicine | Admitting: Family Medicine

## 2017-12-14 DIAGNOSIS — H6122 Impacted cerumen, left ear: Secondary | ICD-10-CM

## 2017-12-14 DIAGNOSIS — G8110 Spastic hemiplegia affecting unspecified side: Secondary | ICD-10-CM

## 2017-12-14 DIAGNOSIS — I69351 Hemiplegia and hemiparesis following cerebral infarction affecting right dominant side: Secondary | ICD-10-CM | POA: Diagnosis not present

## 2017-12-14 DIAGNOSIS — I63512 Cerebral infarction due to unspecified occlusion or stenosis of left middle cerebral artery: Secondary | ICD-10-CM

## 2017-12-14 DIAGNOSIS — Z8673 Personal history of transient ischemic attack (TIA), and cerebral infarction without residual deficits: Secondary | ICD-10-CM | POA: Diagnosis not present

## 2017-12-14 DIAGNOSIS — I10 Essential (primary) hypertension: Secondary | ICD-10-CM

## 2017-12-14 DIAGNOSIS — Z760 Encounter for issue of repeat prescription: Secondary | ICD-10-CM | POA: Diagnosis not present

## 2017-12-14 MED ORDER — CLOPIDOGREL BISULFATE 75 MG PO TABS: 75.0000 mg | ORAL_TABLET | Freq: Every day | ORAL | 3 refills | Status: DC

## 2017-12-14 MED ORDER — AMLODIPINE BESYLATE 5 MG PO TABS: 5.0000 mg | ORAL_TABLET | Freq: Every day | ORAL | 3 refills | Status: DC

## 2017-12-14 MED ORDER — BACLOFEN 10 MG PO TABS: 10.0000 mg | ORAL_TABLET | Freq: Three times a day (TID) | ORAL | 3 refills | Status: DC

## 2017-12-14 NOTE — Discharge Instructions (Signed)
Follow-up with the neurologist noted below
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
## 2018-01-09 ENCOUNTER — Other Ambulatory Visit: Payer: Self-pay

## 2018-01-09 ENCOUNTER — Emergency Department (HOSPITAL_COMMUNITY)
Admission: EM | Admit: 2018-01-09 | Discharge: 2018-01-09 | Disposition: A | Discharge status: Home / Self Care | Payer: Medicaid Other | Attending: Emergency Medicine | Admitting: Emergency Medicine

## 2018-01-09 ENCOUNTER — Encounter (HOSPITAL_COMMUNITY): Payer: Self-pay

## 2018-01-09 DIAGNOSIS — Z91040 Latex allergy status: Secondary | ICD-10-CM | POA: Diagnosis not present

## 2018-01-09 DIAGNOSIS — I10 Essential (primary) hypertension: Secondary | ICD-10-CM | POA: Insufficient documentation

## 2018-01-09 DIAGNOSIS — F17210 Nicotine dependence, cigarettes, uncomplicated: Secondary | ICD-10-CM | POA: Insufficient documentation

## 2018-01-09 DIAGNOSIS — R569 Unspecified convulsions: Secondary | ICD-10-CM | POA: Diagnosis present

## 2018-01-09 DIAGNOSIS — Z8673 Personal history of transient ischemic attack (TIA), and cerebral infarction without residual deficits: Secondary | ICD-10-CM | POA: Diagnosis not present

## 2018-01-09 DIAGNOSIS — Z79899 Other long term (current) drug therapy: Secondary | ICD-10-CM | POA: Diagnosis not present

## 2018-01-09 HISTORY — DX: Unspecified convulsions: R56.9

## 2018-01-09 MED ORDER — SODIUM CHLORIDE 0.9 % IV SOLN
500.0000 mg | Freq: Once | INTRAVENOUS | Status: AC
  Administered 2018-01-09: 500 mg via INTRAVENOUS
  Filled 2018-01-09: qty 525
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
## 2018-01-09 NOTE — Discharge Instructions (Signed)
Please be aware you may have another seizure ° °Do not drive until seen by your physician for your condition ° °Do not climb ladders/roofs/trees as a seizure can occur at that height and cause serious harm ° °Do not bathe/swim alone as a seizure can occur and cause serious harm ° °Please followup with your physician or neurologist for further testing and possible treatment ° ° °

## 2018-01-09 NOTE — ED Triage Notes (Signed)
He was seen by his significant other to have seizure. He arrives here drowsy and in no distress. He has many sequelae from prior CVA, including speech impediment. He was able to ambulate with assist to EMS' stretcher at scene.

## 2018-02-13 ENCOUNTER — Ambulatory Visit (HOSPITAL_COMMUNITY)
Admission: EM | Admit: 2018-02-13 | Discharge: 2018-02-13 | Disposition: A | Discharge status: Home / Self Care | Payer: Medicaid Other | Attending: Radiology | Admitting: Radiology

## 2018-02-13 ENCOUNTER — Encounter (HOSPITAL_COMMUNITY): Payer: Self-pay | Admitting: *Deleted

## 2018-02-13 DIAGNOSIS — J309 Allergic rhinitis, unspecified: Secondary | ICD-10-CM

## 2018-02-13 MED ORDER — FLUTICASONE PROPIONATE 50 MCG/ACT NA SUSP: 1.0000 | Freq: Every day | NASAL | 2 refills | Status: DC

## 2018-02-13 MED ORDER — CETIRIZINE HCL 10 MG PO TABS: 10.0000 mg | ORAL_TABLET | Freq: Every day | ORAL | 0 refills | Status: DC

## 2018-02-13 MED ORDER — LEVETIRACETAM 500 MG PO TABS: 500.0000 mg | ORAL_TABLET | Freq: Two times a day (BID) | ORAL | 1 refills | Status: DC

## 2018-02-13 NOTE — ED Triage Notes (Addendum)
C/O nasal congestion, back ache, HA x 3 days with chills; denies fevers.  Also c/o HA. Requesting med refill for Keppra - took last pill today; does not have PCP.  Also c/o out of pain med.
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
## 2018-02-13 NOTE — Discharge Instructions (Addendum)
Continue to push fluids

## 2018-03-17 ENCOUNTER — Encounter (HOSPITAL_COMMUNITY): Payer: Self-pay | Admitting: Emergency Medicine

## 2018-03-17 ENCOUNTER — Ambulatory Visit (HOSPITAL_COMMUNITY)
Admission: EM | Admit: 2018-03-17 | Discharge: 2018-03-17 | Disposition: A | Discharge status: Home / Self Care | Payer: Medicaid Other | Attending: Family Medicine | Admitting: Family Medicine

## 2018-03-17 DIAGNOSIS — Z760 Encounter for issue of repeat prescription: Secondary | ICD-10-CM

## 2018-03-17 DIAGNOSIS — G8110 Spastic hemiplegia affecting unspecified side: Secondary | ICD-10-CM

## 2018-03-17 DIAGNOSIS — H6122 Impacted cerumen, left ear: Secondary | ICD-10-CM

## 2018-03-17 DIAGNOSIS — G8929 Other chronic pain: Secondary | ICD-10-CM

## 2018-03-17 DIAGNOSIS — G40909 Epilepsy, unspecified, not intractable, without status epilepticus: Secondary | ICD-10-CM | POA: Diagnosis not present

## 2018-03-17 DIAGNOSIS — I69398 Other sequelae of cerebral infarction: Secondary | ICD-10-CM | POA: Diagnosis not present

## 2018-03-17 DIAGNOSIS — I63512 Cerebral infarction due to unspecified occlusion or stenosis of left middle cerebral artery: Secondary | ICD-10-CM

## 2018-03-17 MED ORDER — LEVETIRACETAM 500 MG PO TABS: 500.0000 mg | ORAL_TABLET | Freq: Two times a day (BID) | ORAL | 1 refills | Status: DC

## 2018-03-17 NOTE — Discharge Instructions (Addendum)
Call today to make follow up appointments You need a primary care doctor You need a neurologist You need to go to a pain clinic if you feel the need to take pain medicine You have refills until January on the norvasc, plavix and baclofen I have refilled 2 months of Keppra
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
## 2018-03-17 NOTE — ED Triage Notes (Signed)
Pt requesting medication refill for seizure meds

## 2018-04-14 ENCOUNTER — Other Ambulatory Visit: Payer: Self-pay | Admitting: Physical Medicine & Rehabilitation

## 2018-04-30 ENCOUNTER — Encounter: Payer: Self-pay | Admitting: Occupational Therapy
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
## 2018-05-04 ENCOUNTER — Emergency Department (HOSPITAL_COMMUNITY)
Admission: EM | Admit: 2018-05-04 | Discharge: 2018-05-05 | Disposition: A | Discharge status: Home / Self Care | Payer: Medicaid Other | Attending: Emergency Medicine | Admitting: Emergency Medicine

## 2018-05-04 ENCOUNTER — Encounter (HOSPITAL_COMMUNITY): Payer: Self-pay | Admitting: *Deleted

## 2018-05-04 DIAGNOSIS — I10 Essential (primary) hypertension: Secondary | ICD-10-CM | POA: Diagnosis not present

## 2018-05-04 DIAGNOSIS — R51 Headache: Secondary | ICD-10-CM | POA: Diagnosis present

## 2018-05-04 DIAGNOSIS — Z7902 Long term (current) use of antithrombotics/antiplatelets: Secondary | ICD-10-CM | POA: Insufficient documentation

## 2018-05-04 DIAGNOSIS — R109 Unspecified abdominal pain: Secondary | ICD-10-CM | POA: Diagnosis not present

## 2018-05-04 DIAGNOSIS — R52 Pain, unspecified: Secondary | ICD-10-CM

## 2018-05-04 DIAGNOSIS — F17210 Nicotine dependence, cigarettes, uncomplicated: Secondary | ICD-10-CM | POA: Insufficient documentation

## 2018-05-04 LAB — CBC
HEMATOCRIT: 42.8 % (ref 39.0–52.0)
Hemoglobin: 13.7 g/dL (ref 13.0–17.0)
MCH: 29.1 pg (ref 26.0–34.0)
MCHC: 32 g/dL (ref 30.0–36.0)
MCV: 90.9 fL (ref 78.0–100.0)
PLATELETS: 205 10*3/uL (ref 150–400)
RBC: 4.71 MIL/uL (ref 4.22–5.81)
RDW: 14.2 % (ref 11.5–15.5)
WBC: 4.6 10*3/uL (ref 4.0–10.5)

## 2018-05-04 NOTE — ED Triage Notes (Signed)
To ED for eval of abd pain and HA for past 2 weeks- this per [facility] report. Pt acknowledges with 'yep' when asked. Points to head and stomach and groin area while shaking head. Asked if there is pain and he responds 'yep'. Hx of stroke. No active vomiting. Appears in nad

## 2018-05-05 ENCOUNTER — Emergency Department (HOSPITAL_COMMUNITY): Payer: Medicaid Other

## 2018-05-05 LAB — URINALYSIS, ROUTINE W REFLEX MICROSCOPIC
Bilirubin Urine: NEGATIVE
GLUCOSE, UA: NEGATIVE mg/dL
HGB URINE DIPSTICK: NEGATIVE
KETONES UR: NEGATIVE mg/dL
LEUKOCYTES UA: NEGATIVE
Nitrite: NEGATIVE
PH: 6 (ref 5.0–8.0)
PROTEIN: NEGATIVE mg/dL
Specific Gravity, Urine: 1.03 (ref 1.005–1.030)

## 2018-05-05 LAB — COMPREHENSIVE METABOLIC PANEL
ALBUMIN: 3.7 g/dL (ref 3.5–5.0)
ALK PHOS: 89 U/L (ref 38–126)
ALT: 12 U/L — AB (ref 17–63)
ANION GAP: 9 (ref 5–15)
AST: 17 U/L (ref 15–41)
BILIRUBIN TOTAL: 0.2 mg/dL — AB (ref 0.3–1.2)
BUN: 9 mg/dL (ref 6–20)
CALCIUM: 8.7 mg/dL — AB (ref 8.9–10.3)
CO2: 25 mmol/L (ref 22–32)
CREATININE: 0.95 mg/dL (ref 0.61–1.24)
Chloride: 107 mmol/L (ref 101–111)
GFR calc Af Amer: >60 mL/min (ref >60)
GFR calc non Af Amer: >60 mL/min (ref >60)
GLUCOSE: 131 mg/dL — AB (ref 65–99)
Potassium: 3.6 mmol/L (ref 3.5–5.1)
SODIUM: 141 mmol/L (ref 135–145)
TOTAL PROTEIN: 6.3 g/dL — AB (ref 6.5–8.1)

## 2018-05-05 LAB — LIPASE, BLOOD: Lipase: 31 U/L (ref 11–51)

## 2018-05-05 MED ORDER — OXYCODONE-ACETAMINOPHEN 5-325 MG PO TABS
1.0000 | ORAL_TABLET | Freq: Once | ORAL | Status: AC
  Administered 2018-05-05: 1 via ORAL
  Filled 2018-05-05: qty 1

## 2018-05-05 MED ORDER — OXYCODONE-ACETAMINOPHEN 5-325 MG PO TABS: 1.0000 | ORAL_TABLET | Freq: Four times a day (QID) | ORAL | 0 refills | Status: DC | PRN
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
## 2018-05-06 LAB — URINE CULTURE: CULTURE: NO GROWTH

## 2018-05-10 ENCOUNTER — Other Ambulatory Visit: Payer: Self-pay

## 2018-05-10 ENCOUNTER — Encounter (HOSPITAL_COMMUNITY): Payer: Self-pay | Admitting: *Deleted

## 2018-05-10 ENCOUNTER — Emergency Department (HOSPITAL_COMMUNITY)
Admission: EM | Admit: 2018-05-10 | Discharge: 2018-05-10 | Disposition: A | Discharge status: Home / Self Care | Payer: Medicaid Other | Attending: Emergency Medicine | Admitting: Emergency Medicine

## 2018-05-10 DIAGNOSIS — Z91040 Latex allergy status: Secondary | ICD-10-CM | POA: Insufficient documentation

## 2018-05-10 DIAGNOSIS — Z7902 Long term (current) use of antithrombotics/antiplatelets: Secondary | ICD-10-CM | POA: Diagnosis not present

## 2018-05-10 DIAGNOSIS — Z8673 Personal history of transient ischemic attack (TIA), and cerebral infarction without residual deficits: Secondary | ICD-10-CM | POA: Diagnosis not present

## 2018-05-10 DIAGNOSIS — F17210 Nicotine dependence, cigarettes, uncomplicated: Secondary | ICD-10-CM | POA: Insufficient documentation

## 2018-05-10 DIAGNOSIS — I10 Essential (primary) hypertension: Secondary | ICD-10-CM | POA: Diagnosis present

## 2018-05-10 DIAGNOSIS — Z79899 Other long term (current) drug therapy: Secondary | ICD-10-CM | POA: Insufficient documentation

## 2018-05-10 MED ORDER — LEVETIRACETAM 500 MG PO TABS: 500.0000 mg | ORAL_TABLET | Freq: Two times a day (BID) | ORAL | 1 refills | Status: DC

## 2018-05-10 MED ORDER — CLOPIDOGREL BISULFATE 75 MG PO TABS: 75.0000 mg | ORAL_TABLET | Freq: Every day | ORAL | 0 refills | Status: DC

## 2018-05-10 MED ORDER — AMLODIPINE BESYLATE 5 MG PO TABS: 5.0000 mg | ORAL_TABLET | Freq: Every day | ORAL | 0 refills | Status: DC

## 2018-05-10 MED ORDER — LEVETIRACETAM 500 MG PO TABS
500.0000 mg | ORAL_TABLET | Freq: Once | ORAL | Status: AC
  Administered 2018-05-10: 500 mg via ORAL
  Filled 2018-05-10: qty 1

## 2018-05-10 NOTE — ED Notes (Signed)
ED Provider spoke with patient's family member, Aniceto Boss, over the phone regarding plan of care and d/c instructions.
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
## 2018-05-10 NOTE — ED Triage Notes (Signed)
Pt in via EMS to triage from his MD office, BP was elevated in office, given clonodine and metoprolol in office and were concerned because BP remained high. Pt alert and oriented on arrival, hx of stroke with speech deficits from this and right arm weakness. BP for EMS 156/100

## 2018-05-10 NOTE — Discharge Instructions (Addendum)
Please read attached information. If you experience any new or worsening signs or symptoms please return to the emergency room for evaluation. Please follow-up with your primary care provider or specialist as discussed. Please use medication prescribed only as directed and discontinue taking if you have any concerning signs or symptoms.   °

## 2018-05-10 NOTE — ED Notes (Signed)
Patient verbalized understanding of discharge instructions and denies any further needs or questions at this time. VS stable. Patient ambulatory with steady gait.  

## 2018-07-16 ENCOUNTER — Encounter (HOSPITAL_COMMUNITY): Payer: Self-pay

## 2018-07-16 ENCOUNTER — Ambulatory Visit (HOSPITAL_COMMUNITY)
Admission: EM | Admit: 2018-07-16 | Discharge: 2018-07-16 | Disposition: A | Discharge status: Home / Self Care | Payer: Medicaid Other | Attending: Internal Medicine | Admitting: Internal Medicine

## 2018-07-16 DIAGNOSIS — R569 Unspecified convulsions: Secondary | ICD-10-CM

## 2018-07-16 DIAGNOSIS — I63412 Cerebral infarction due to embolism of left middle cerebral artery: Secondary | ICD-10-CM | POA: Diagnosis not present

## 2018-07-16 DIAGNOSIS — Z760 Encounter for issue of repeat prescription: Secondary | ICD-10-CM

## 2018-07-16 MED ORDER — LEVETIRACETAM 500 MG PO TABS: 500.0000 mg | ORAL_TABLET | Freq: Two times a day (BID) | ORAL | 0 refills | Status: DC

## 2018-07-16 MED ORDER — CLOPIDOGREL BISULFATE 75 MG PO TABS: 75.0000 mg | ORAL_TABLET | Freq: Every day | ORAL | 0 refills | Status: DC

## 2018-07-16 NOTE — Discharge Instructions (Signed)
Blood pressure was well-controlled in clinic today.  Continue amlodipine for blood pressure.  Refills for keppra (seizures) and for clopidogrel (blood thinner, for stroke) were sent to the pharmacy.  Please make an appointment with [facility] to establish care, and help manage ongoing health issues.
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
## 2018-07-16 NOTE — ED Triage Notes (Signed)
Pt presents to get medication refill on plavix and keppra .

## 2018-07-26 ENCOUNTER — Ambulatory Visit: Payer: Medicaid Other | Admitting: Occupational Therapy

## 2018-07-28 ENCOUNTER — Ambulatory Visit: Payer: Medicaid Other | Admitting: Occupational Therapy

## 2018-08-04 ENCOUNTER — Ambulatory Visit: Payer: Medicaid Other | Admitting: Rehabilitative and Restorative Service Providers"

## 2018-08-04 ENCOUNTER — Ambulatory Visit: Payer: Medicaid Other | Attending: Internal Medicine

## 2018-08-04 ENCOUNTER — Ambulatory Visit: Payer: Medicaid Other | Admitting: Occupational Therapy

## 2018-08-05 ENCOUNTER — Ambulatory Visit: Payer: Medicaid Other | Admitting: Rehabilitative and Restorative Service Providers"

## 2018-08-10 ENCOUNTER — Ambulatory Visit: Payer: Medicaid Other | Admitting: Occupational Therapy

## 2018-08-16 ENCOUNTER — Ambulatory Visit (HOSPITAL_COMMUNITY)
Admission: EM | Admit: 2018-08-16 | Discharge: 2018-08-16 | Disposition: A | Discharge status: Home / Self Care | Payer: Medicaid Other | Attending: Family Medicine | Admitting: Family Medicine

## 2018-08-16 ENCOUNTER — Encounter (HOSPITAL_COMMUNITY): Payer: Self-pay | Admitting: Emergency Medicine

## 2018-08-16 DIAGNOSIS — I69398 Other sequelae of cerebral infarction: Secondary | ICD-10-CM | POA: Diagnosis not present

## 2018-08-16 DIAGNOSIS — I10 Essential (primary) hypertension: Secondary | ICD-10-CM

## 2018-08-16 DIAGNOSIS — Z760 Encounter for issue of repeat prescription: Secondary | ICD-10-CM

## 2018-08-16 MED ORDER — AMLODIPINE BESYLATE 5 MG PO TABS: 5.0000 mg | ORAL_TABLET | Freq: Every day | ORAL | 0 refills | Status: DC

## 2018-08-16 MED ORDER — CLOPIDOGREL BISULFATE 75 MG PO TABS: 75.0000 mg | ORAL_TABLET | Freq: Every day | ORAL | 0 refills | Status: DC
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
## 2018-08-16 NOTE — Discharge Instructions (Addendum)
I am giving you 30 days worth of your medication You need you to follow-up with community health and wellness for primary care. Have somebody help you call them to make an appointment.  We cannot keep refilling your medications here at urgent care.

## 2018-08-16 NOTE — ED Triage Notes (Addendum)
PT needs refill on plavix and amlodipine
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
## 2018-09-15 ENCOUNTER — Encounter (HOSPITAL_COMMUNITY): Payer: Self-pay

## 2018-09-15 ENCOUNTER — Emergency Department (HOSPITAL_COMMUNITY)
Admission: EM | Admit: 2018-09-15 | Discharge: 2018-09-15 | Disposition: A | Discharge status: Home / Self Care | Payer: Medicaid Other | Attending: Emergency Medicine | Admitting: Emergency Medicine

## 2018-09-15 ENCOUNTER — Other Ambulatory Visit: Payer: Self-pay

## 2018-09-15 DIAGNOSIS — R569 Unspecified convulsions: Secondary | ICD-10-CM | POA: Diagnosis not present

## 2018-09-15 DIAGNOSIS — Z8673 Personal history of transient ischemic attack (TIA), and cerebral infarction without residual deficits: Secondary | ICD-10-CM | POA: Insufficient documentation

## 2018-09-15 DIAGNOSIS — Z7902 Long term (current) use of antithrombotics/antiplatelets: Secondary | ICD-10-CM | POA: Diagnosis not present

## 2018-09-15 DIAGNOSIS — Z760 Encounter for issue of repeat prescription: Secondary | ICD-10-CM | POA: Diagnosis present

## 2018-09-15 DIAGNOSIS — F17210 Nicotine dependence, cigarettes, uncomplicated: Secondary | ICD-10-CM | POA: Diagnosis not present

## 2018-09-15 DIAGNOSIS — Z91040 Latex allergy status: Secondary | ICD-10-CM | POA: Insufficient documentation

## 2018-09-15 DIAGNOSIS — Z79899 Other long term (current) drug therapy: Secondary | ICD-10-CM | POA: Insufficient documentation

## 2018-09-15 DIAGNOSIS — I10 Essential (primary) hypertension: Secondary | ICD-10-CM | POA: Insufficient documentation

## 2018-09-15 MED ORDER — LEVETIRACETAM 500 MG PO TABS: 500.0000 mg | ORAL_TABLET | Freq: Two times a day (BID) | ORAL | 1 refills | Status: DC

## 2018-09-15 NOTE — ED Triage Notes (Signed)
Pt presents with empty pill bottle of Levetiracetam 500mg  tabs and requesting refill.  Pt reports history of seizures and hasn't taken this med in more than a week.  Pt is completely aphasic secondary to CVA in 2013, however is A&Ox4
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
## 2018-09-15 NOTE — Discharge Instructions (Addendum)
Medicine Refill at the Emergency Department We have refilled your medicine today, but it is best for you to get refills through your primary health care provider's office. In the future, please plan ahead so you do not need to get refills from the emergency department. If the medicine we refilled was a maintenance medicine, you may have received only enough to get you by until you are able to see your regular health care provider. This information is not intended to replace advice given to you by your health care provider. Make sure you discuss any questions you have with your health care provider.

## 2018-10-06 ENCOUNTER — Inpatient Hospital Stay (INDEPENDENT_AMBULATORY_CARE_PROVIDER_SITE_OTHER): Payer: Medicaid Other | Admitting: Physician Assistant

## 2018-10-19 ENCOUNTER — Emergency Department (HOSPITAL_COMMUNITY)
Admission: EM | Admit: 2018-10-19 | Discharge: 2018-10-19 | Disposition: A | Discharge status: Home / Self Care | Payer: Medicaid Other | Attending: Emergency Medicine | Admitting: Emergency Medicine

## 2018-10-19 ENCOUNTER — Other Ambulatory Visit: Payer: Self-pay

## 2018-10-19 ENCOUNTER — Encounter (HOSPITAL_COMMUNITY): Payer: Self-pay | Admitting: Emergency Medicine

## 2018-10-19 DIAGNOSIS — Z7902 Long term (current) use of antithrombotics/antiplatelets: Secondary | ICD-10-CM | POA: Diagnosis not present

## 2018-10-19 DIAGNOSIS — Z79899 Other long term (current) drug therapy: Secondary | ICD-10-CM | POA: Diagnosis not present

## 2018-10-19 DIAGNOSIS — F17210 Nicotine dependence, cigarettes, uncomplicated: Secondary | ICD-10-CM | POA: Diagnosis not present

## 2018-10-19 DIAGNOSIS — Z8673 Personal history of transient ischemic attack (TIA), and cerebral infarction without residual deficits: Secondary | ICD-10-CM | POA: Insufficient documentation

## 2018-10-19 DIAGNOSIS — Z760 Encounter for issue of repeat prescription: Secondary | ICD-10-CM | POA: Insufficient documentation

## 2018-10-19 DIAGNOSIS — I10 Essential (primary) hypertension: Secondary | ICD-10-CM | POA: Diagnosis not present

## 2018-10-19 DIAGNOSIS — Z91040 Latex allergy status: Secondary | ICD-10-CM | POA: Insufficient documentation

## 2018-10-19 MED ORDER — LEVETIRACETAM 500 MG PO TABS: 500.0000 mg | ORAL_TABLET | Freq: Two times a day (BID) | ORAL | 1 refills | Status: DC

## 2018-10-19 NOTE — ED Triage Notes (Signed)
Pt here requesting refill of keppra. Hx of prior stroke. Can answer yes/no questions only. VSS. Pt with no other complaints today.

## 2018-10-19 NOTE — Discharge Instructions (Addendum)
Please read attached information. If you experience any new or worsening signs or symptoms please return to the emergency room for evaluation. Please follow-up with your primary care provider or specialist as discussed. Please use medication prescribed only as directed and discontinue taking if you have any concerning signs or symptoms.   °
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
## 2018-11-01 ENCOUNTER — Inpatient Hospital Stay (INDEPENDENT_AMBULATORY_CARE_PROVIDER_SITE_OTHER): Payer: Medicaid Other | Admitting: Physician Assistant

## 2018-12-23 ENCOUNTER — Emergency Department (HOSPITAL_COMMUNITY)
Admission: EM | Admit: 2018-12-23 | Discharge: 2018-12-23 | Disposition: A | Discharge status: Home / Self Care | Payer: Medicaid Other | Attending: Emergency Medicine | Admitting: Emergency Medicine

## 2018-12-23 ENCOUNTER — Encounter (HOSPITAL_COMMUNITY): Payer: Self-pay

## 2018-12-23 DIAGNOSIS — G40909 Epilepsy, unspecified, not intractable, without status epilepticus: Secondary | ICD-10-CM | POA: Insufficient documentation

## 2018-12-23 DIAGNOSIS — F17210 Nicotine dependence, cigarettes, uncomplicated: Secondary | ICD-10-CM | POA: Insufficient documentation

## 2018-12-23 DIAGNOSIS — Z760 Encounter for issue of repeat prescription: Secondary | ICD-10-CM | POA: Diagnosis not present

## 2018-12-23 DIAGNOSIS — I10 Essential (primary) hypertension: Secondary | ICD-10-CM | POA: Diagnosis not present

## 2018-12-23 DIAGNOSIS — Z79899 Other long term (current) drug therapy: Secondary | ICD-10-CM | POA: Insufficient documentation

## 2018-12-23 MED ORDER — AMLODIPINE BESYLATE 5 MG PO TABS
5.0000 mg | ORAL_TABLET | Freq: Once | ORAL | Status: AC
  Administered 2018-12-23: 5 mg via ORAL
  Filled 2018-12-23: qty 1

## 2018-12-23 MED ORDER — CLOPIDOGREL BISULFATE 75 MG PO TABS: 75.0000 mg | ORAL_TABLET | Freq: Every day | ORAL | 1 refills | Status: DC

## 2018-12-23 MED ORDER — LEVETIRACETAM 500 MG PO TABS: 500.0000 mg | ORAL_TABLET | Freq: Two times a day (BID) | ORAL | 1 refills | Status: DC

## 2018-12-23 MED ORDER — LEVETIRACETAM 500 MG PO TABS
500.0000 mg | ORAL_TABLET | Freq: Once | ORAL | Status: AC
  Administered 2018-12-23: 500 mg via ORAL
  Filled 2018-12-23: qty 1

## 2018-12-23 MED ORDER — AMLODIPINE BESYLATE 5 MG PO TABS: 5.0000 mg | ORAL_TABLET | Freq: Every day | ORAL | 1 refills | Status: DC

## 2018-12-23 NOTE — Discharge Instructions (Signed)
Take your medications as prescribed, make sure to follow-up with your doctor to check on your blood pressure

## 2018-12-23 NOTE — ED Triage Notes (Signed)
Patient here for medication refill of keppra. Complains of headache with same, patient alert and denies pain, NAD

## 2018-12-23 NOTE — ED Notes (Signed)
Patient verbalizes understanding of discharge instructions. Opportunity for questioning and answers were provided. Armband removed by staff, pt discharged from ED ambulatory.   
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
## 2019-01-16 ENCOUNTER — Encounter (HOSPITAL_COMMUNITY): Payer: Self-pay

## 2019-01-16 ENCOUNTER — Emergency Department (HOSPITAL_COMMUNITY)
Admission: EM | Admit: 2019-01-16 | Discharge: 2019-01-16 | Disposition: A | Discharge status: Home / Self Care | Payer: Medicaid Other | Attending: Emergency Medicine | Admitting: Emergency Medicine

## 2019-01-16 ENCOUNTER — Emergency Department (HOSPITAL_COMMUNITY): Payer: Medicaid Other

## 2019-01-16 ENCOUNTER — Other Ambulatory Visit: Payer: Self-pay

## 2019-01-16 DIAGNOSIS — R079 Chest pain, unspecified: Secondary | ICD-10-CM | POA: Diagnosis present

## 2019-01-16 DIAGNOSIS — Z79899 Other long term (current) drug therapy: Secondary | ICD-10-CM | POA: Insufficient documentation

## 2019-01-16 DIAGNOSIS — J189 Pneumonia, unspecified organism: Secondary | ICD-10-CM

## 2019-01-16 DIAGNOSIS — F17210 Nicotine dependence, cigarettes, uncomplicated: Secondary | ICD-10-CM | POA: Diagnosis not present

## 2019-01-16 DIAGNOSIS — I10 Essential (primary) hypertension: Secondary | ICD-10-CM | POA: Insufficient documentation

## 2019-01-16 DIAGNOSIS — J181 Lobar pneumonia, unspecified organism: Secondary | ICD-10-CM | POA: Diagnosis not present

## 2019-01-16 LAB — BASIC METABOLIC PANEL
Anion gap: 12 (ref 5–15)
BUN: 6 mg/dL (ref 6–20)
CO2: 21 mmol/L — ABNORMAL LOW (ref 22–32)
Calcium: 8.7 mg/dL — ABNORMAL LOW (ref 8.9–10.3)
Chloride: 99 mmol/L (ref 98–111)
Creatinine, Ser: 0.77 mg/dL (ref 0.61–1.24)
GFR calc Af Amer: >60 mL/min (ref >60)
GFR calc non Af Amer: >60 mL/min (ref >60)
Glucose, Bld: 151 mg/dL — ABNORMAL HIGH (ref 70–99)
Potassium: 4.2 mmol/L (ref 3.5–5.1)
SODIUM: 132 mmol/L — AB (ref 135–145)

## 2019-01-16 LAB — I-STAT TROPONIN, ED: Troponin i, poc: 0 ng/mL (ref 0.00–0.08)

## 2019-01-16 LAB — CBC
HCT: 37.2 % — ABNORMAL LOW (ref 39.0–52.0)
Hemoglobin: 12 g/dL — ABNORMAL LOW (ref 13.0–17.0)
MCH: 31.1 pg (ref 26.0–34.0)
MCHC: 32.3 g/dL (ref 30.0–36.0)
MCV: 96.4 fL (ref 80.0–100.0)
Platelets: 311 10*3/uL (ref 150–400)
RBC: 3.86 MIL/uL — ABNORMAL LOW (ref 4.22–5.81)
RDW: 12.1 % (ref 11.5–15.5)
WBC: 9.2 10*3/uL (ref 4.0–10.5)
nRBC: 0 % (ref 0.0–0.2)

## 2019-01-16 LAB — D-DIMER, QUANTITATIVE: D-Dimer, Quant: 7.63 ug/mL-FEU — ABNORMAL HIGH (ref 0.00–0.50)

## 2019-01-16 MED ORDER — SODIUM CHLORIDE 0.9 % IV SOLN
1.0000 g | Freq: Once | INTRAVENOUS | Status: AC
  Administered 2019-01-16: 1 g via INTRAVENOUS
  Filled 2019-01-16: qty 10

## 2019-01-16 MED ORDER — IOHEXOL 350 MG/ML SOLN
75.0000 mL | Freq: Once | INTRAVENOUS | Status: AC | PRN
  Administered 2019-01-16: 75 mL via INTRAVENOUS

## 2019-01-16 MED ORDER — AZITHROMYCIN 250 MG PO TABS: 250.0000 mg | ORAL_TABLET | Freq: Every day | ORAL | 0 refills | Status: DC

## 2019-01-16 MED ORDER — SODIUM CHLORIDE 0.9 % IV BOLUS
1000.0000 mL | Freq: Once | INTRAVENOUS | Status: AC
  Administered 2019-01-16: 1000 mL via INTRAVENOUS

## 2019-01-16 MED ORDER — AZITHROMYCIN 250 MG PO TABS
500.0000 mg | ORAL_TABLET | Freq: Once | ORAL | Status: AC
  Administered 2019-01-16: 500 mg via ORAL
  Filled 2019-01-16: qty 2

## 2019-01-16 NOTE — ED Notes (Signed)
Patient verbalizes understanding of discharge instructions. Opportunity for questioning and answers were provided. Armband removed by staff, pt discharged from ED.  
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
## 2019-01-16 NOTE — Discharge Instructions (Signed)
You were seen in the emergency department for a few days of some left-sided chest pain.  You have a left sided pneumonia which is likely the cause of your pain.  We are prescribing some antibiotics to finish.  Please limit your smoking and use Tylenol or ibuprofen for pain.  Return the emergency department if any worsening symptoms.

## 2019-01-16 NOTE — ED Triage Notes (Signed)
Pt reports intermittent left sided chest pain that worsened last evening but has been ongoing for about a week. Pt reports 6/10 left sided chest pain that is sharp. Pt reports that it worsens with a deep breath and cough. Pt reports that he has also had some fevers at home with nausea, vomiting and diarrhea. Pt denies any episodes of diarrhea today and his last episode of vomiting was yesterday. Pt also reports shortness of breath that started last evening.

## 2019-01-22 ENCOUNTER — Other Ambulatory Visit: Payer: Self-pay

## 2019-01-22 ENCOUNTER — Encounter (HOSPITAL_COMMUNITY): Payer: Self-pay | Admitting: Emergency Medicine

## 2019-01-22 ENCOUNTER — Emergency Department (HOSPITAL_COMMUNITY)
Admission: EM | Admit: 2019-01-22 | Discharge: 2019-01-22 | Disposition: A | Discharge status: Home / Self Care | Payer: Medicaid Other | Attending: Emergency Medicine | Admitting: Emergency Medicine

## 2019-01-22 DIAGNOSIS — Z79899 Other long term (current) drug therapy: Secondary | ICD-10-CM | POA: Diagnosis not present

## 2019-01-22 DIAGNOSIS — F17210 Nicotine dependence, cigarettes, uncomplicated: Secondary | ICD-10-CM | POA: Diagnosis not present

## 2019-01-22 DIAGNOSIS — Z8673 Personal history of transient ischemic attack (TIA), and cerebral infarction without residual deficits: Secondary | ICD-10-CM | POA: Insufficient documentation

## 2019-01-22 DIAGNOSIS — Z91040 Latex allergy status: Secondary | ICD-10-CM | POA: Diagnosis not present

## 2019-01-22 DIAGNOSIS — I10 Essential (primary) hypertension: Secondary | ICD-10-CM | POA: Diagnosis not present

## 2019-01-22 DIAGNOSIS — R569 Unspecified convulsions: Secondary | ICD-10-CM | POA: Diagnosis not present

## 2019-01-22 DIAGNOSIS — Z7902 Long term (current) use of antithrombotics/antiplatelets: Secondary | ICD-10-CM | POA: Diagnosis not present

## 2019-01-22 DIAGNOSIS — Z760 Encounter for issue of repeat prescription: Secondary | ICD-10-CM | POA: Diagnosis not present

## 2019-01-22 MED ORDER — LEVETIRACETAM 500 MG PO TABS: 500.0000 mg | ORAL_TABLET | Freq: Two times a day (BID) | ORAL | 1 refills | Status: DC
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
## 2019-01-22 NOTE — ED Notes (Signed)
Patient verbalized understanding of dc instructions, vss, ambulatory with nad.   

## 2019-01-22 NOTE — Discharge Instructions (Signed)
I have refilled your levetiracetam (keppra) for this month.   Any further refills will need to be done through your primary care doctor.   Return to ER for new symptoms or any additional concerns.

## 2019-01-22 NOTE — ED Triage Notes (Signed)
Patient reports he needs refills on his medications, does not see a pcp. He denies any symptoms today.

## 2019-01-24 ENCOUNTER — Ambulatory Visit: Payer: Medicaid Other | Admitting: Family Medicine

## 2019-02-22 ENCOUNTER — Other Ambulatory Visit: Payer: Self-pay

## 2019-02-22 ENCOUNTER — Emergency Department (HOSPITAL_COMMUNITY)
Admission: EM | Admit: 2019-02-22 | Discharge: 2019-02-22 | Disposition: A | Discharge status: Home / Self Care | Payer: Medicaid Other | Attending: Emergency Medicine | Admitting: Emergency Medicine

## 2019-02-22 ENCOUNTER — Encounter (HOSPITAL_COMMUNITY): Payer: Self-pay

## 2019-02-22 DIAGNOSIS — Z79899 Other long term (current) drug therapy: Secondary | ICD-10-CM | POA: Insufficient documentation

## 2019-02-22 DIAGNOSIS — Z760 Encounter for issue of repeat prescription: Secondary | ICD-10-CM | POA: Insufficient documentation

## 2019-02-22 DIAGNOSIS — F17210 Nicotine dependence, cigarettes, uncomplicated: Secondary | ICD-10-CM | POA: Diagnosis not present

## 2019-02-22 DIAGNOSIS — I10 Essential (primary) hypertension: Secondary | ICD-10-CM | POA: Insufficient documentation

## 2019-02-22 MED ORDER — LEVETIRACETAM 500 MG PO TABS: 500.0000 mg | ORAL_TABLET | Freq: Two times a day (BID) | ORAL | 0 refills | Status: DC

## 2019-02-22 NOTE — Discharge Instructions (Signed)
You have been diagnosed today with encounter for medication refill  At this time there does not appear to be the presence of an emergent medical condition, however there is always the potential for conditions to change. Please read and follow the below instructions.  Please return to the Emergency Department immediately for any new/concerning or worsening symptoms Please be sure to follow up with your Primary Care Provider within one week regarding your visit today; please call their office to schedule an appointment even if you are feeling better for a follow-up visit. You may follow-up with your primary care provider or the [facility] for future medication refills.   Please read the additional information packets attached to your discharge summary.  Do not take your medicine if  develop an itchy rash, swelling in your mouth or lips, or difficulty breathing.
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
## 2019-02-22 NOTE — ED Triage Notes (Signed)
Pt arrives POV, pt ran out of keppra. Pt alert and oriented. Pt denies any other complaints.

## 2019-02-22 NOTE — ED Notes (Signed)
Patient verbalizes understanding of discharge instructions. Opportunity for questioning and answers were provided. Armband removed by staff, pt discharged from ED. Prescription reviewed. Pt ambulatory to lobby.

## 2019-03-22 ENCOUNTER — Other Ambulatory Visit: Payer: Self-pay

## 2019-03-22 ENCOUNTER — Emergency Department (HOSPITAL_COMMUNITY)
Admission: EM | Admit: 2019-03-22 | Discharge: 2019-03-22 | Disposition: A | Discharge status: Home / Self Care | Payer: Medicaid Other | Attending: Emergency Medicine | Admitting: Emergency Medicine

## 2019-03-22 DIAGNOSIS — G8191 Hemiplegia, unspecified affecting right dominant side: Secondary | ICD-10-CM | POA: Diagnosis not present

## 2019-03-22 DIAGNOSIS — Z79899 Other long term (current) drug therapy: Secondary | ICD-10-CM | POA: Diagnosis not present

## 2019-03-22 DIAGNOSIS — Z760 Encounter for issue of repeat prescription: Secondary | ICD-10-CM | POA: Diagnosis not present

## 2019-03-22 DIAGNOSIS — I10 Essential (primary) hypertension: Secondary | ICD-10-CM | POA: Diagnosis not present

## 2019-03-22 DIAGNOSIS — F17210 Nicotine dependence, cigarettes, uncomplicated: Secondary | ICD-10-CM | POA: Insufficient documentation

## 2019-03-22 DIAGNOSIS — Z91040 Latex allergy status: Secondary | ICD-10-CM | POA: Diagnosis not present

## 2019-03-22 MED ORDER — LEVETIRACETAM 500 MG PO TABS: 500.0000 mg | ORAL_TABLET | Freq: Every day | ORAL | 0 refills | Status: DC

## 2019-03-22 MED ORDER — CLOPIDOGREL BISULFATE 75 MG PO TABS: 75.0000 mg | ORAL_TABLET | Freq: Every day | ORAL | 0 refills | Status: DC
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
## 2019-03-22 NOTE — ED Notes (Signed)
Patient verbalizes understanding of discharge instructions. Opportunity for questioning and answers were provided. Armband removed by staff, pt discharged from ED.  

## 2019-03-22 NOTE — ED Triage Notes (Signed)
Pt here for refill of his Plavix. No other complaints.

## 2019-03-22 NOTE — Discharge Instructions (Addendum)
Please follow up with your primary care provider within 5-7 days for re-evaluation of your symptoms. If you do not have a primary care provider, information for a healthcare clinic has been provided for you to make arrangements for follow up care. Please return to the emergency department for any new or worsening symptoms. ° °

## 2019-04-12 ENCOUNTER — Emergency Department (HOSPITAL_COMMUNITY)
Admission: EM | Admit: 2019-04-12 | Discharge: 2019-04-12 | Disposition: A | Discharge status: Home / Self Care | Payer: Medicaid Other | Attending: Emergency Medicine | Admitting: Emergency Medicine

## 2019-04-12 ENCOUNTER — Other Ambulatory Visit: Payer: Self-pay

## 2019-04-12 DIAGNOSIS — I10 Essential (primary) hypertension: Secondary | ICD-10-CM | POA: Insufficient documentation

## 2019-04-12 DIAGNOSIS — Z760 Encounter for issue of repeat prescription: Secondary | ICD-10-CM | POA: Insufficient documentation

## 2019-04-12 DIAGNOSIS — Z91040 Latex allergy status: Secondary | ICD-10-CM | POA: Diagnosis not present

## 2019-04-12 DIAGNOSIS — F17210 Nicotine dependence, cigarettes, uncomplicated: Secondary | ICD-10-CM | POA: Insufficient documentation

## 2019-04-12 DIAGNOSIS — Z79899 Other long term (current) drug therapy: Secondary | ICD-10-CM | POA: Diagnosis not present

## 2019-04-12 DIAGNOSIS — Z8673 Personal history of transient ischemic attack (TIA), and cerebral infarction without residual deficits: Secondary | ICD-10-CM | POA: Insufficient documentation

## 2019-04-12 MED ORDER — LEVETIRACETAM 500 MG PO TABS: 500.0000 mg | ORAL_TABLET | Freq: Every day | ORAL | 0 refills | Status: DC

## 2019-04-12 MED ORDER — CLOPIDOGREL BISULFATE 75 MG PO TABS: 75.0000 mg | ORAL_TABLET | Freq: Every day | ORAL | 0 refills | Status: DC

## 2019-04-12 MED ORDER — CLOPIDOGREL BISULFATE 75 MG PO TABS: 75.0000 mg | ORAL_TABLET | Freq: Every day | ORAL | 0 refills | Status: AC

## 2019-04-12 NOTE — ED Triage Notes (Signed)
Pt here for med refill ; no other complains

## 2019-04-12 NOTE — ED Notes (Signed)
Patient verbalizes understanding of discharge instructions. Opportunity for questioning and answering were provided. , patient discharged from ED.

## 2019-04-12 NOTE — Discharge Instructions (Addendum)
Your prescriptions have [facility] to [facility] on [address]  It is very important that you follow-up with a primary care provider.  Please keep calling the clinic to make sure you able to schedule an appointment.  Return  to the emergency department for any new or worsening symptoms.

## 2019-04-12 NOTE — Progress Notes (Addendum)
CSW received call from [place], [place] regarding patient's need for PCP follow up for continued medical care and prescription management. CSW notified RN CM for assistance.  CSW met with patient at bedside to obtain his phone number to assist RN CM with setting up a PCP appointment, however patient reports not having a working cell phone. CSW explained to patient the importance of him seeing his PCP regularly and he stated understanding.   CSW explained [employer] application to patient and patient asked CSW to assist him. CSW completed application and faxed it to [phone].    Madilyn Fireman, MSW, LCSW-A Clinical Social Worker [facility] [phone]
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
## 2019-05-11 ENCOUNTER — Encounter (HOSPITAL_COMMUNITY): Payer: Self-pay

## 2019-05-11 ENCOUNTER — Other Ambulatory Visit: Payer: Self-pay

## 2019-05-11 ENCOUNTER — Emergency Department (HOSPITAL_COMMUNITY)
Admission: EM | Admit: 2019-05-11 | Discharge: 2019-05-11 | Disposition: A | Discharge status: Home / Self Care | Payer: Medicaid Other | Attending: Emergency Medicine | Admitting: Emergency Medicine

## 2019-05-11 DIAGNOSIS — R569 Unspecified convulsions: Secondary | ICD-10-CM | POA: Insufficient documentation

## 2019-05-11 DIAGNOSIS — Z79899 Other long term (current) drug therapy: Secondary | ICD-10-CM | POA: Insufficient documentation

## 2019-05-11 DIAGNOSIS — F319 Bipolar disorder, unspecified: Secondary | ICD-10-CM | POA: Insufficient documentation

## 2019-05-11 DIAGNOSIS — Z760 Encounter for issue of repeat prescription: Secondary | ICD-10-CM | POA: Diagnosis present

## 2019-05-11 DIAGNOSIS — Z7902 Long term (current) use of antithrombotics/antiplatelets: Secondary | ICD-10-CM | POA: Insufficient documentation

## 2019-05-11 DIAGNOSIS — F17210 Nicotine dependence, cigarettes, uncomplicated: Secondary | ICD-10-CM | POA: Insufficient documentation

## 2019-05-11 DIAGNOSIS — I10 Essential (primary) hypertension: Secondary | ICD-10-CM | POA: Diagnosis not present

## 2019-05-11 MED ORDER — AMLODIPINE BESYLATE 5 MG PO TABS: 5.0000 mg | ORAL_TABLET | Freq: Every day | ORAL | 1 refills | Status: DC

## 2019-05-11 MED ORDER — LEVETIRACETAM 500 MG PO TABS: 500.0000 mg | ORAL_TABLET | Freq: Every day | ORAL | 0 refills | Status: DC

## 2019-05-11 NOTE — ED Notes (Signed)
Bed: WTR6 Expected date:  Expected time:  Means of arrival:  Comments: 

## 2019-05-11 NOTE — ED Triage Notes (Signed)
Pt reports that he is here for medication refills of Amlodipine and Keppra. Pt is unable to communicated verbally but is able to hear and comprehend using gesture and does not know sign language.

## 2019-05-11 NOTE — ED Notes (Signed)
When RN went in to D/c patient, patient reported he was hungry. Pt given an extra tray and d/c instructions reviewed with patient.
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
## 2019-05-11 NOTE — Discharge Instructions (Signed)
It is important for you to establish care with a primary care provider for further refills of your medications. Return to ED if you start to develop chest pain, shortness of breath, fever, leg swelling or loss of consciousness.

## 2019-05-31 ENCOUNTER — Emergency Department (HOSPITAL_COMMUNITY)
Admission: EM | Admit: 2019-05-31 | Discharge: 2019-05-31 | Disposition: A | Discharge status: Home / Self Care | Payer: Medicaid Other | Attending: Emergency Medicine | Admitting: Emergency Medicine

## 2019-05-31 ENCOUNTER — Other Ambulatory Visit: Payer: Self-pay

## 2019-05-31 DIAGNOSIS — Z7902 Long term (current) use of antithrombotics/antiplatelets: Secondary | ICD-10-CM | POA: Insufficient documentation

## 2019-05-31 DIAGNOSIS — I10 Essential (primary) hypertension: Secondary | ICD-10-CM | POA: Insufficient documentation

## 2019-05-31 DIAGNOSIS — Z79899 Other long term (current) drug therapy: Secondary | ICD-10-CM | POA: Insufficient documentation

## 2019-05-31 DIAGNOSIS — G8110 Spastic hemiplegia affecting unspecified side: Secondary | ICD-10-CM | POA: Diagnosis not present

## 2019-05-31 DIAGNOSIS — Z760 Encounter for issue of repeat prescription: Secondary | ICD-10-CM | POA: Insufficient documentation

## 2019-05-31 DIAGNOSIS — F17210 Nicotine dependence, cigarettes, uncomplicated: Secondary | ICD-10-CM | POA: Diagnosis not present

## 2019-05-31 MED ORDER — LEVETIRACETAM 500 MG PO TABS: 500.0000 mg | ORAL_TABLET | Freq: Every day | ORAL | 0 refills | Status: DC

## 2019-05-31 MED ORDER — CLOPIDOGREL BISULFATE 75 MG PO TABS: 75.0000 mg | ORAL_TABLET | Freq: Every day | ORAL | 0 refills | Status: DC

## 2019-05-31 NOTE — Discharge Planning (Signed)
[facility] notes consult to speak with pt who frequently visits ED for Rx refills.  EDCM notes that pt is insured with an assigned MD Criss Rosales). EDCM/SW will remind pt importance of follow up with PCP for future Rx needs.

## 2019-05-31 NOTE — Discharge Instructions (Signed)
Please continue taking your Keppra and Plavix daily as recommended.  You need to call to schedule a follow-up appointment with Dr. Criss Rosales your primary care doctor to continue getting refills of these medications.  Return to the emergency department for any new or concerning symptoms.
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
## 2019-05-31 NOTE — ED Triage Notes (Signed)
Pt reports need for medication refill of his Keppra and Clopidogrel. Pt has been seen multiple times previously for the same.  Pt alert and ambulatory

## 2019-07-12 ENCOUNTER — Other Ambulatory Visit: Payer: Self-pay

## 2019-07-12 ENCOUNTER — Emergency Department (HOSPITAL_COMMUNITY)
Admission: EM | Admit: 2019-07-12 | Discharge: 2019-07-12 | Disposition: A | Discharge status: Home / Self Care | Payer: Medicaid Other | Attending: Emergency Medicine | Admitting: Emergency Medicine

## 2019-07-12 ENCOUNTER — Encounter (HOSPITAL_COMMUNITY): Payer: Self-pay

## 2019-07-12 DIAGNOSIS — I69391 Dysphagia following cerebral infarction: Secondary | ICD-10-CM | POA: Diagnosis not present

## 2019-07-12 DIAGNOSIS — Z880 Allergy status to penicillin: Secondary | ICD-10-CM | POA: Insufficient documentation

## 2019-07-12 DIAGNOSIS — Z760 Encounter for issue of repeat prescription: Secondary | ICD-10-CM | POA: Diagnosis not present

## 2019-07-12 DIAGNOSIS — I10 Essential (primary) hypertension: Secondary | ICD-10-CM | POA: Diagnosis not present

## 2019-07-12 DIAGNOSIS — Z91040 Latex allergy status: Secondary | ICD-10-CM | POA: Diagnosis not present

## 2019-07-12 DIAGNOSIS — F17210 Nicotine dependence, cigarettes, uncomplicated: Secondary | ICD-10-CM | POA: Diagnosis not present

## 2019-07-12 DIAGNOSIS — Z79899 Other long term (current) drug therapy: Secondary | ICD-10-CM | POA: Insufficient documentation

## 2019-07-12 MED ORDER — CLOPIDOGREL BISULFATE 75 MG PO TABS: 75.0000 mg | ORAL_TABLET | Freq: Every day | ORAL | 0 refills | Status: DC

## 2019-07-12 MED ORDER — LEVETIRACETAM 500 MG PO TABS: 500.0000 mg | ORAL_TABLET | Freq: Two times a day (BID) | ORAL | 0 refills | Status: DC

## 2019-07-12 MED ORDER — AMLODIPINE BESYLATE 5 MG PO TABS: 5.0000 mg | ORAL_TABLET | Freq: Every day | ORAL | 1 refills | Status: DC

## 2019-07-12 NOTE — ED Triage Notes (Signed)
Pt reports needing a refill of prescriptions. Patient states he has been out for 3 months.   Clopidogrel 75 mg 1 tablet daily Levetiracetam 500 mg  1 tablet twice daily  Amlodipine 5 mg 1 tablet daily.    Ambulatory in triage.     Patient is not oriented

## 2019-07-12 NOTE — Discharge Instructions (Addendum)
Take medications as prescribed. Follow up with your doctor 07/20/2019 10AM Dr. Rosana Berger, arrive at 9:45AM, bring your medication bottles, insurance card. Wear a mask.   Ask the office to help you sent up a patient portal on your phone. This way you can text your doctor questions, medication refills, appointments.
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
## 2019-09-23 ENCOUNTER — Emergency Department (HOSPITAL_COMMUNITY)
Admission: EM | Admit: 2019-09-23 | Discharge: 2019-09-23 | Disposition: A | Discharge status: Home / Self Care | Payer: Medicaid Other | Attending: Emergency Medicine | Admitting: Emergency Medicine

## 2019-09-23 ENCOUNTER — Other Ambulatory Visit: Payer: Self-pay

## 2019-09-23 ENCOUNTER — Encounter (HOSPITAL_COMMUNITY): Payer: Self-pay | Admitting: *Deleted

## 2019-09-23 DIAGNOSIS — Z760 Encounter for issue of repeat prescription: Secondary | ICD-10-CM

## 2019-09-23 DIAGNOSIS — I10 Essential (primary) hypertension: Secondary | ICD-10-CM | POA: Insufficient documentation

## 2019-09-23 DIAGNOSIS — F17210 Nicotine dependence, cigarettes, uncomplicated: Secondary | ICD-10-CM | POA: Diagnosis not present

## 2019-09-23 DIAGNOSIS — Z79899 Other long term (current) drug therapy: Secondary | ICD-10-CM | POA: Diagnosis not present

## 2019-09-23 DIAGNOSIS — Z7901 Long term (current) use of anticoagulants: Secondary | ICD-10-CM | POA: Diagnosis not present

## 2019-09-23 DIAGNOSIS — Z8673 Personal history of transient ischemic attack (TIA), and cerebral infarction without residual deficits: Secondary | ICD-10-CM | POA: Insufficient documentation

## 2019-09-23 MED ORDER — AMLODIPINE BESYLATE 5 MG PO TABS: 5.0000 mg | ORAL_TABLET | Freq: Every day | ORAL | 0 refills | Status: DC

## 2019-09-23 MED ORDER — LEVETIRACETAM 500 MG PO TABS: 500.0000 mg | ORAL_TABLET | Freq: Two times a day (BID) | ORAL | 0 refills | Status: DC

## 2019-09-23 MED ORDER — VALSARTAN 40 MG PO TABS: 40.0000 mg | ORAL_TABLET | Freq: Every day | ORAL | 0 refills | Status: DC

## 2019-09-23 MED ORDER — CLOPIDOGREL BISULFATE 75 MG PO TABS: 75.0000 mg | ORAL_TABLET | Freq: Every day | ORAL | 0 refills | Status: DC

## 2019-09-23 MED ORDER — HYDROCHLOROTHIAZIDE 12.5 MG PO TABS: 12.5000 mg | ORAL_TABLET | Freq: Every day | ORAL | 0 refills | Status: DC

## 2019-09-23 NOTE — Discharge Instructions (Signed)
Your medications have been refilled today, please follow-up with your primary care doctor for further medication refill needs.

## 2019-09-23 NOTE — ED Notes (Signed)
Patient verbalizes understanding of discharge instructions. Opportunity for questioning and answers were provided. Armband removed by staff, pt discharged from ED. Ambulated out to lobby  

## 2019-09-23 NOTE — ED Notes (Signed)
Spoke with Case Management.  She stated pt's MD is right across the road, but the pt continues to come here every month for med refills.  She spoke with him last month RE this, but he continues to come here.
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
## 2019-09-23 NOTE — Discharge Planning (Signed)
EDCM spoke with pt at bedside regarding frequent ED visits for medication refills.  EDCM reminded pt  That his PCP is across the street; pt says it hard getting appointment.  EDCM contacted MD office to find that pt pt was seen in office 9/28 and has refills for most Rx and appointment 11/9@1030  with them.

## 2019-09-23 NOTE — ED Triage Notes (Signed)
Pt states he needs his meds refilled.  He has no other complaints.

## 2019-10-28 ENCOUNTER — Encounter (HOSPITAL_COMMUNITY): Payer: Self-pay | Admitting: Emergency Medicine

## 2019-10-28 ENCOUNTER — Emergency Department (HOSPITAL_COMMUNITY)
Admission: EM | Admit: 2019-10-28 | Discharge: 2019-10-28 | Disposition: A | Discharge status: Home / Self Care | Payer: Medicaid Other | Attending: Emergency Medicine | Admitting: Emergency Medicine

## 2019-10-28 DIAGNOSIS — Z8673 Personal history of transient ischemic attack (TIA), and cerebral infarction without residual deficits: Secondary | ICD-10-CM | POA: Diagnosis not present

## 2019-10-28 DIAGNOSIS — I10 Essential (primary) hypertension: Secondary | ICD-10-CM | POA: Diagnosis not present

## 2019-10-28 DIAGNOSIS — R569 Unspecified convulsions: Secondary | ICD-10-CM | POA: Diagnosis not present

## 2019-10-28 DIAGNOSIS — F17210 Nicotine dependence, cigarettes, uncomplicated: Secondary | ICD-10-CM | POA: Insufficient documentation

## 2019-10-28 DIAGNOSIS — Z79899 Other long term (current) drug therapy: Secondary | ICD-10-CM | POA: Insufficient documentation

## 2019-10-28 DIAGNOSIS — Z760 Encounter for issue of repeat prescription: Secondary | ICD-10-CM | POA: Diagnosis not present

## 2019-10-28 MED ORDER — HYDROCHLOROTHIAZIDE 12.5 MG PO TABS: 12.5000 mg | ORAL_TABLET | Freq: Every day | ORAL | 1 refills | Status: DC

## 2019-10-28 MED ORDER — AMLODIPINE BESYLATE 5 MG PO TABS: 5.0000 mg | ORAL_TABLET | Freq: Every day | ORAL | 1 refills | Status: DC

## 2019-10-28 MED ORDER — VALSARTAN 40 MG PO TABS: 40.0000 mg | ORAL_TABLET | Freq: Every day | ORAL | 1 refills | Status: DC

## 2019-10-28 MED ORDER — LEVETIRACETAM 500 MG PO TABS: 500.0000 mg | ORAL_TABLET | Freq: Two times a day (BID) | ORAL | 1 refills | Status: DC

## 2019-10-28 MED ORDER — CLOPIDOGREL BISULFATE 75 MG PO TABS: 75.0000 mg | ORAL_TABLET | Freq: Every day | ORAL | 1 refills | Status: DC
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
## 2019-10-28 NOTE — ED Triage Notes (Signed)
Pt nonverbal, hands me several bottles of empty meds, stating he needs his meds refilled.

## 2019-10-28 NOTE — Discharge Instructions (Signed)
Follow up with primary care for further refills.

## 2020-01-02 ENCOUNTER — Other Ambulatory Visit: Payer: Self-pay

## 2020-01-02 ENCOUNTER — Encounter (HOSPITAL_COMMUNITY): Payer: Self-pay

## 2020-01-02 ENCOUNTER — Emergency Department (HOSPITAL_COMMUNITY): Payer: Medicaid Other

## 2020-01-02 ENCOUNTER — Emergency Department (HOSPITAL_COMMUNITY)
Admission: EM | Admit: 2020-01-02 | Discharge: 2020-01-03 | Disposition: A | Discharge status: Home / Self Care | Payer: Medicaid Other | Attending: Emergency Medicine | Admitting: Emergency Medicine

## 2020-01-02 DIAGNOSIS — Y999 Unspecified external cause status: Secondary | ICD-10-CM | POA: Insufficient documentation

## 2020-01-02 DIAGNOSIS — Z79899 Other long term (current) drug therapy: Secondary | ICD-10-CM | POA: Diagnosis not present

## 2020-01-02 DIAGNOSIS — S0990XA Unspecified injury of head, initial encounter: Secondary | ICD-10-CM | POA: Insufficient documentation

## 2020-01-02 DIAGNOSIS — Z7902 Long term (current) use of antithrombotics/antiplatelets: Secondary | ICD-10-CM | POA: Insufficient documentation

## 2020-01-02 DIAGNOSIS — Z23 Encounter for immunization: Secondary | ICD-10-CM | POA: Insufficient documentation

## 2020-01-02 DIAGNOSIS — Y9389 Activity, other specified: Secondary | ICD-10-CM | POA: Diagnosis not present

## 2020-01-02 DIAGNOSIS — R4182 Altered mental status, unspecified: Secondary | ICD-10-CM | POA: Diagnosis present

## 2020-01-02 DIAGNOSIS — F17210 Nicotine dependence, cigarettes, uncomplicated: Secondary | ICD-10-CM | POA: Insufficient documentation

## 2020-01-02 DIAGNOSIS — Y92512 Supermarket, store or market as the place of occurrence of the external cause: Secondary | ICD-10-CM | POA: Insufficient documentation

## 2020-01-02 DIAGNOSIS — F10220 Alcohol dependence with intoxication, uncomplicated: Secondary | ICD-10-CM | POA: Diagnosis not present

## 2020-01-02 DIAGNOSIS — W19XXXA Unspecified fall, initial encounter: Secondary | ICD-10-CM | POA: Diagnosis not present

## 2020-01-02 DIAGNOSIS — F10920 Alcohol use, unspecified with intoxication, uncomplicated: Secondary | ICD-10-CM

## 2020-01-02 MED ORDER — TETANUS-DIPHTH-ACELL PERTUSSIS 5-2.5-18.5 LF-MCG/0.5 IM SUSP
0.5000 mL | Freq: Once | INTRAMUSCULAR | Status: AC
  Administered 2020-01-02: 0.5 mL via INTRAMUSCULAR
  Filled 2020-01-02: qty 0.5
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
## 2020-01-02 NOTE — ED Notes (Signed)
Patient transported to CT 

## 2020-01-03 ENCOUNTER — Other Ambulatory Visit (HOSPITAL_COMMUNITY): Payer: Self-pay

## 2020-01-03 ENCOUNTER — Emergency Department (HOSPITAL_COMMUNITY): Payer: Medicaid Other

## 2020-01-03 LAB — COMPREHENSIVE METABOLIC PANEL
ALT: 28 U/L (ref 0–44)
AST: 39 U/L (ref 15–41)
Albumin: 4.5 g/dL (ref 3.5–5.0)
Alkaline Phosphatase: 108 U/L (ref 38–126)
Anion gap: 15 (ref 5–15)
BUN: 7 mg/dL (ref 6–20)
CO2: 19 mmol/L — ABNORMAL LOW (ref 22–32)
Calcium: 9.2 mg/dL (ref 8.9–10.3)
Chloride: 106 mmol/L (ref 98–111)
Creatinine, Ser: 0.7 mg/dL (ref 0.61–1.24)
GFR calc Af Amer: >60 mL/min (ref >60)
GFR calc non Af Amer: >60 mL/min (ref >60)
Glucose, Bld: 98 mg/dL (ref 70–99)
Potassium: 3.9 mmol/L (ref 3.5–5.1)
Sodium: 140 mmol/L (ref 135–145)
Total Bilirubin: 0.9 mg/dL (ref 0.3–1.2)
Total Protein: 7.9 g/dL (ref 6.5–8.1)

## 2020-01-03 LAB — CBC WITH DIFFERENTIAL/PLATELET
Abs Immature Granulocytes: 0.04 10*3/uL (ref 0.00–0.07)
Basophils Absolute: 0.1 10*3/uL (ref 0.0–0.1)
Basophils Relative: 1 %
Eosinophils Absolute: 0.3 10*3/uL (ref 0.0–0.5)
Eosinophils Relative: 3 %
HCT: 47.1 % (ref 39.0–52.0)
Hemoglobin: 15.4 g/dL (ref 13.0–17.0)
Immature Granulocytes: 0 %
Lymphocytes Relative: 18 %
Lymphs Abs: 1.7 10*3/uL (ref 0.7–4.0)
MCH: 30.9 pg (ref 26.0–34.0)
MCHC: 32.7 g/dL (ref 30.0–36.0)
MCV: 94.6 fL (ref 80.0–100.0)
Monocytes Absolute: 0.7 10*3/uL (ref 0.1–1.0)
Monocytes Relative: 8 %
Neutro Abs: 6.3 10*3/uL (ref 1.7–7.7)
Neutrophils Relative %: 70 %
Platelets: 366 10*3/uL (ref 150–400)
RBC: 4.98 MIL/uL (ref 4.22–5.81)
RDW: 13.9 % (ref 11.5–15.5)
WBC: 9.1 10*3/uL (ref 4.0–10.5)
nRBC: 0 % (ref 0.0–0.2)

## 2020-01-03 LAB — RAPID URINE DRUG SCREEN, HOSP PERFORMED
Amphetamines: NOT DETECTED
Barbiturates: NOT DETECTED
Benzodiazepines: NOT DETECTED
Cocaine: NOT DETECTED
Opiates: NOT DETECTED
Tetrahydrocannabinol: POSITIVE — AB

## 2020-01-03 LAB — URINALYSIS, ROUTINE W REFLEX MICROSCOPIC
Bilirubin Urine: NEGATIVE
Glucose, UA: NEGATIVE mg/dL
Hgb urine dipstick: NEGATIVE
Ketones, ur: NEGATIVE mg/dL
Leukocytes,Ua: NEGATIVE
Nitrite: NEGATIVE
Protein, ur: NEGATIVE mg/dL
Specific Gravity, Urine: 1.008 (ref 1.005–1.030)
pH: 7 (ref 5.0–8.0)

## 2020-01-03 LAB — ETHANOL: Alcohol, Ethyl (B): 280 mg/dL — ABNORMAL HIGH (ref <10)

## 2020-01-03 NOTE — Discharge Instructions (Addendum)
Your labs, CT of your head and neck, chest x-ray showed no acute abnormality other than an alcohol level of 280.  Your urine showed no sign of infection or dehydration.  I recommend you decrease your alcohol intake.

## 2020-02-08 ENCOUNTER — Encounter (HOSPITAL_COMMUNITY): Payer: Self-pay | Admitting: Emergency Medicine

## 2020-02-08 ENCOUNTER — Emergency Department (HOSPITAL_COMMUNITY)
Admission: EM | Admit: 2020-02-08 | Discharge: 2020-02-08 | Disposition: A | Discharge status: Home / Self Care | Payer: Medicaid Other | Attending: Emergency Medicine | Admitting: Emergency Medicine

## 2020-02-08 ENCOUNTER — Other Ambulatory Visit: Payer: Self-pay

## 2020-02-08 DIAGNOSIS — Z91040 Latex allergy status: Secondary | ICD-10-CM | POA: Insufficient documentation

## 2020-02-08 DIAGNOSIS — I10 Essential (primary) hypertension: Secondary | ICD-10-CM | POA: Diagnosis not present

## 2020-02-08 DIAGNOSIS — F17210 Nicotine dependence, cigarettes, uncomplicated: Secondary | ICD-10-CM | POA: Diagnosis not present

## 2020-02-08 DIAGNOSIS — Z8673 Personal history of transient ischemic attack (TIA), and cerebral infarction without residual deficits: Secondary | ICD-10-CM | POA: Diagnosis not present

## 2020-02-08 DIAGNOSIS — Z79899 Other long term (current) drug therapy: Secondary | ICD-10-CM | POA: Diagnosis not present

## 2020-02-08 DIAGNOSIS — Z760 Encounter for issue of repeat prescription: Secondary | ICD-10-CM | POA: Diagnosis not present

## 2020-02-08 DIAGNOSIS — R519 Headache, unspecified: Secondary | ICD-10-CM | POA: Diagnosis present

## 2020-02-08 LAB — CBC
HCT: 48.7 % (ref 39.0–52.0)
Hemoglobin: 15.7 g/dL (ref 13.0–17.0)
MCH: 30 pg (ref 26.0–34.0)
MCHC: 32.2 g/dL (ref 30.0–36.0)
MCV: 93.1 fL (ref 80.0–100.0)
Platelets: 222 10*3/uL (ref 150–400)
RBC: 5.23 MIL/uL (ref 4.22–5.81)
RDW: 13.9 % (ref 11.5–15.5)
WBC: 6.4 10*3/uL (ref 4.0–10.5)
nRBC: 0 % (ref 0.0–0.2)

## 2020-02-08 LAB — BASIC METABOLIC PANEL
Anion gap: 13 (ref 5–15)
BUN: 10 mg/dL (ref 6–20)
CO2: 25 mmol/L (ref 22–32)
Calcium: 9.5 mg/dL (ref 8.9–10.3)
Chloride: 104 mmol/L (ref 98–111)
Creatinine, Ser: 0.98 mg/dL (ref 0.61–1.24)
GFR calc Af Amer: >60 mL/min (ref >60)
GFR calc non Af Amer: >60 mL/min (ref >60)
Glucose, Bld: 93 mg/dL (ref 70–99)
Potassium: 4.3 mmol/L (ref 3.5–5.1)
Sodium: 142 mmol/L (ref 135–145)

## 2020-02-08 MED ORDER — LEVETIRACETAM 500 MG PO TABS: 500.0000 mg | ORAL_TABLET | Freq: Two times a day (BID) | ORAL | 0 refills | Status: DC

## 2020-02-08 MED ORDER — CLOPIDOGREL BISULFATE 75 MG PO TABS: 75.0000 mg | ORAL_TABLET | Freq: Every day | ORAL | 0 refills | Status: DC

## 2020-02-08 MED ORDER — AMLODIPINE BESYLATE 5 MG PO TABS: 5.0000 mg | ORAL_TABLET | Freq: Every day | ORAL | 0 refills | Status: DC

## 2020-02-08 MED ORDER — METOCLOPRAMIDE HCL 5 MG/ML IJ SOLN
10.0000 mg | Freq: Once | INTRAMUSCULAR | Status: AC
  Administered 2020-02-08: 10 mg via INTRAVENOUS
  Filled 2020-02-08: qty 2

## 2020-02-08 MED ORDER — SODIUM CHLORIDE 0.9 % IV BOLUS
1000.0000 mL | Freq: Once | INTRAVENOUS | Status: AC
  Administered 2020-02-08: 12:00:00 1000 mL via INTRAVENOUS

## 2020-02-08 MED ORDER — SODIUM CHLORIDE 0.9 % IV SOLN: INTRAVENOUS | Status: DC

## 2020-02-08 MED ORDER — DIPHENHYDRAMINE HCL 50 MG/ML IJ SOLN
12.5000 mg | Freq: Once | INTRAMUSCULAR | Status: AC
  Administered 2020-02-08: 12.5 mg via INTRAVENOUS
  Filled 2020-02-08: qty 1

## 2020-02-08 NOTE — ED Triage Notes (Addendum)
C/o L sided headache x 3 days.
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
## 2020-02-08 NOTE — ED Notes (Signed)
All appropriate discharge materials reviewed at length with patient. Time for questions provided. Pt has no other questions at this time and verbalizes understanding of all provided materials.  

## 2020-02-08 NOTE — Discharge Instructions (Addendum)
You need to make an appointment with your PCP to get your medications refilled.

## 2020-03-21 ENCOUNTER — Emergency Department (HOSPITAL_COMMUNITY)
Admission: EM | Admit: 2020-03-21 | Discharge: 2020-03-21 | Disposition: A | Discharge status: Home / Self Care | Payer: Medicaid Other | Attending: Emergency Medicine | Admitting: Emergency Medicine

## 2020-03-21 DIAGNOSIS — Z760 Encounter for issue of repeat prescription: Secondary | ICD-10-CM | POA: Diagnosis not present

## 2020-03-21 MED ORDER — LEVETIRACETAM 500 MG PO TABS: 500.0000 mg | ORAL_TABLET | Freq: Two times a day (BID) | ORAL | 1 refills | Status: DC

## 2020-03-21 MED ORDER — AMLODIPINE BESYLATE 5 MG PO TABS: 5.0000 mg | ORAL_TABLET | Freq: Every day | ORAL | 1 refills | Status: DC

## 2020-03-21 MED ORDER — CLOPIDOGREL BISULFATE 75 MG PO TABS: 75.0000 mg | ORAL_TABLET | Freq: Every day | ORAL | 1 refills | Status: DC

## 2020-03-21 NOTE — Discharge Instructions (Signed)
Medicine Refill at the Emergency Department We have refilled your medicine today. However, it is best for you to get refills through your primary health care provider's office. In the future, please plan ahead so you do not need to get refills from the emergency department. If we refilled a medicine that you take daily for a chronic problem (maintenance medicine), you may have received only enough to get you by until you are able to see your regular health care provider. This information is not intended to replace advice given to you by your health care provider. Make sure you discuss any questions you have with your health care provider.

## 2020-03-21 NOTE — ED Notes (Signed)
ED Provider at bedside. 

## 2020-03-21 NOTE — ED Triage Notes (Signed)
Pt here for refill of his amlodipine, plavix and keppra.
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
## 2020-04-24 ENCOUNTER — Encounter (HOSPITAL_COMMUNITY): Payer: Self-pay

## 2020-04-24 ENCOUNTER — Emergency Department (HOSPITAL_COMMUNITY)
Admission: EM | Admit: 2020-04-24 | Discharge: 2020-04-24 | Disposition: A | Discharge status: Home / Self Care | Payer: Medicaid Other | Attending: Emergency Medicine | Admitting: Emergency Medicine

## 2020-04-24 DIAGNOSIS — Z79899 Other long term (current) drug therapy: Secondary | ICD-10-CM | POA: Insufficient documentation

## 2020-04-24 DIAGNOSIS — I10 Essential (primary) hypertension: Secondary | ICD-10-CM | POA: Diagnosis not present

## 2020-04-24 DIAGNOSIS — Z7902 Long term (current) use of antithrombotics/antiplatelets: Secondary | ICD-10-CM | POA: Diagnosis not present

## 2020-04-24 DIAGNOSIS — Z760 Encounter for issue of repeat prescription: Secondary | ICD-10-CM | POA: Diagnosis present

## 2020-04-24 DIAGNOSIS — F17210 Nicotine dependence, cigarettes, uncomplicated: Secondary | ICD-10-CM | POA: Diagnosis not present

## 2020-04-24 MED ORDER — CLOPIDOGREL BISULFATE 75 MG PO TABS: 75.0000 mg | ORAL_TABLET | Freq: Every day | ORAL | 0 refills | Status: DC

## 2020-04-24 MED ORDER — VALSARTAN 40 MG PO TABS: 40.0000 mg | ORAL_TABLET | Freq: Every day | ORAL | 0 refills | Status: DC

## 2020-04-24 MED ORDER — AMLODIPINE BESYLATE 5 MG PO TABS: 5.0000 mg | ORAL_TABLET | Freq: Every day | ORAL | 0 refills | Status: DC

## 2020-04-24 MED ORDER — LEVETIRACETAM 500 MG PO TABS: 500.0000 mg | ORAL_TABLET | Freq: Two times a day (BID) | ORAL | 0 refills | Status: DC

## 2020-04-24 NOTE — Discharge Instructions (Addendum)
Please be sure to follow-up in our community care clinic for additional medical needs.

## 2020-04-24 NOTE — ED Triage Notes (Signed)
Pt requesting refill of plavix, keppra and amlodipine. Bottle shows 1 refill before next year but pt states he called and they werent able to get it filled.

## 2020-04-24 NOTE — ED Notes (Signed)
Patient verbalized understanding of DC instructions, Rx, follow up care.
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
## 2020-05-28 ENCOUNTER — Emergency Department (HOSPITAL_COMMUNITY)
Admission: EM | Admit: 2020-05-28 | Discharge: 2020-05-28 | Disposition: A | Discharge status: Home / Self Care | Payer: Medicaid Other | Attending: Emergency Medicine | Admitting: Emergency Medicine

## 2020-05-28 ENCOUNTER — Encounter (HOSPITAL_COMMUNITY): Payer: Self-pay

## 2020-05-28 DIAGNOSIS — Z91040 Latex allergy status: Secondary | ICD-10-CM | POA: Diagnosis not present

## 2020-05-28 DIAGNOSIS — F17210 Nicotine dependence, cigarettes, uncomplicated: Secondary | ICD-10-CM | POA: Insufficient documentation

## 2020-05-28 DIAGNOSIS — G8111 Spastic hemiplegia affecting right dominant side: Secondary | ICD-10-CM | POA: Insufficient documentation

## 2020-05-28 DIAGNOSIS — Z7902 Long term (current) use of antithrombotics/antiplatelets: Secondary | ICD-10-CM | POA: Diagnosis not present

## 2020-05-28 DIAGNOSIS — Z760 Encounter for issue of repeat prescription: Secondary | ICD-10-CM

## 2020-05-28 DIAGNOSIS — Z79899 Other long term (current) drug therapy: Secondary | ICD-10-CM | POA: Insufficient documentation

## 2020-05-28 DIAGNOSIS — I10 Essential (primary) hypertension: Secondary | ICD-10-CM | POA: Insufficient documentation

## 2020-05-28 MED ORDER — CLOPIDOGREL BISULFATE 75 MG PO TABS: 75.0000 mg | ORAL_TABLET | Freq: Every day | ORAL | 0 refills | Status: DC

## 2020-05-28 MED ORDER — AMLODIPINE BESYLATE 5 MG PO TABS: 5.0000 mg | ORAL_TABLET | Freq: Every day | ORAL | 0 refills | Status: DC

## 2020-05-28 MED ORDER — LEVETIRACETAM 500 MG PO TABS: 500.0000 mg | ORAL_TABLET | Freq: Two times a day (BID) | ORAL | 0 refills | Status: DC

## 2020-05-28 NOTE — Discharge Instructions (Signed)
I have refilled your 3 medications.  I have also included the number for [facility].  I recommend calling today to schedule appointment to establish care, so you do not run out of your medication.  Return to the ER for new or worsening symptoms.

## 2020-05-28 NOTE — ED Triage Notes (Signed)
Pt requesting refill of clopidogrel and amlodipine, been out for 1 week

## 2020-05-28 NOTE — ED Notes (Signed)
ED Provider at bedside. 
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
## 2020-07-05 ENCOUNTER — Emergency Department (HOSPITAL_COMMUNITY)
Admission: EM | Admit: 2020-07-05 | Discharge: 2020-07-05 | Disposition: A | Discharge status: Home / Self Care | Payer: Medicaid Other | Attending: Emergency Medicine | Admitting: Emergency Medicine

## 2020-07-05 ENCOUNTER — Other Ambulatory Visit: Payer: Self-pay

## 2020-07-05 DIAGNOSIS — F17210 Nicotine dependence, cigarettes, uncomplicated: Secondary | ICD-10-CM | POA: Diagnosis not present

## 2020-07-05 DIAGNOSIS — I10 Essential (primary) hypertension: Secondary | ICD-10-CM | POA: Insufficient documentation

## 2020-07-05 DIAGNOSIS — Z8669 Personal history of other diseases of the nervous system and sense organs: Secondary | ICD-10-CM | POA: Insufficient documentation

## 2020-07-05 DIAGNOSIS — Z760 Encounter for issue of repeat prescription: Secondary | ICD-10-CM | POA: Diagnosis not present

## 2020-07-05 DIAGNOSIS — Z91040 Latex allergy status: Secondary | ICD-10-CM | POA: Diagnosis not present

## 2020-07-05 MED ORDER — AMLODIPINE BESYLATE 5 MG PO TABS: 5.0000 mg | ORAL_TABLET | Freq: Once | ORAL | Status: DC

## 2020-07-05 MED ORDER — LEVETIRACETAM 500 MG PO TABS: 500.0000 mg | ORAL_TABLET | Freq: Two times a day (BID) | ORAL | 0 refills | Status: DC

## 2020-07-05 MED ORDER — CLOPIDOGREL BISULFATE 75 MG PO TABS: 75.0000 mg | ORAL_TABLET | Freq: Every day | ORAL | 0 refills | Status: DC

## 2020-07-05 MED ORDER — VALSARTAN 40 MG PO TABS: 40.0000 mg | ORAL_TABLET | Freq: Every day | ORAL | 0 refills | Status: DC

## 2020-07-05 MED ORDER — AMLODIPINE BESYLATE 5 MG PO TABS: 5.0000 mg | ORAL_TABLET | Freq: Every day | ORAL | 0 refills | Status: DC

## 2020-07-05 NOTE — ED Triage Notes (Signed)
Patient here for medication refills, no complaints

## 2020-07-05 NOTE — ED Notes (Signed)
Pt reports he needs refills on his meds

## 2020-07-05 NOTE — ED Notes (Signed)
Patient verbalizes understanding of discharge instructions . Opportunity for questions and answers were provided . Armband removed by staff ,Pt discharged from ED. W/C  offered at D/C  and Declined W/C at D/C and was escorted to lobby by RN.  

## 2020-07-05 NOTE — Progress Notes (Signed)
CSW received consult for patient due to his frequent ED visits for medication refills. Patient has been advised to follow up with [facility] in the past but has not followed through.  CSW made the patient an appointment for 08/10/2020 at 9:30am. CSW added this appointment information to his AVS.  Madilyn Fireman, MSW, LCSW-A Transitions of Care  Clinical Social Worker  [facility]  Medical ICU [phone]
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
## 2020-07-05 NOTE — Discharge Instructions (Signed)
Please make sure to call the [facility] in order to establish with a family doctor who can refill your medications and help manage your chronic medical problems. Return the ER if you have any new or worsening symptoms.   An appointment has been made on your behalf at [facility] for September 24th, 2021 at 9:30am. Please attend this appointment for medication management.

## 2020-08-10 ENCOUNTER — Inpatient Hospital Stay: Payer: Medicaid Other | Admitting: Nurse Practitioner

## 2020-08-22 ENCOUNTER — Emergency Department (HOSPITAL_COMMUNITY)
Admission: EM | Admit: 2020-08-22 | Discharge: 2020-08-22 | Disposition: A | Discharge status: Home / Self Care | Payer: Medicaid Other | Attending: Emergency Medicine | Admitting: Emergency Medicine

## 2020-08-22 ENCOUNTER — Encounter (HOSPITAL_COMMUNITY): Payer: Self-pay | Admitting: Emergency Medicine

## 2020-08-22 ENCOUNTER — Other Ambulatory Visit: Payer: Self-pay

## 2020-08-22 DIAGNOSIS — Z79899 Other long term (current) drug therapy: Secondary | ICD-10-CM | POA: Diagnosis not present

## 2020-08-22 DIAGNOSIS — Z91040 Latex allergy status: Secondary | ICD-10-CM | POA: Diagnosis not present

## 2020-08-22 DIAGNOSIS — Z760 Encounter for issue of repeat prescription: Secondary | ICD-10-CM | POA: Diagnosis not present

## 2020-08-22 DIAGNOSIS — I10 Essential (primary) hypertension: Secondary | ICD-10-CM | POA: Insufficient documentation

## 2020-08-22 DIAGNOSIS — R000 Tachycardia, unspecified: Secondary | ICD-10-CM | POA: Insufficient documentation

## 2020-08-22 DIAGNOSIS — F17210 Nicotine dependence, cigarettes, uncomplicated: Secondary | ICD-10-CM | POA: Diagnosis not present

## 2020-08-22 MED ORDER — VALSARTAN 40 MG PO TABS: 40.0000 mg | ORAL_TABLET | Freq: Every day | ORAL | 0 refills | Status: DC

## 2020-08-22 MED ORDER — AMLODIPINE BESYLATE 5 MG PO TABS: 5.0000 mg | ORAL_TABLET | Freq: Every day | ORAL | 0 refills | Status: DC

## 2020-08-22 MED ORDER — LEVETIRACETAM 500 MG PO TABS: 500.0000 mg | ORAL_TABLET | Freq: Two times a day (BID) | ORAL | 0 refills | Status: DC

## 2020-08-22 MED ORDER — CLOPIDOGREL BISULFATE 75 MG PO TABS: 75.0000 mg | ORAL_TABLET | Freq: Every day | ORAL | 0 refills | Status: DC

## 2020-08-22 NOTE — ED Notes (Signed)
PA at bedside.

## 2020-08-22 NOTE — Discharge Planning (Signed)
Arthur Beasley [name], [name], [name], Veteran  RNCM set up appointment with [facility] on 10/21.  Spoke with pt at bedside and advised to please arrive 15 min early and take a picture ID and your current medications.  Pt verbalizes understanding of keeping appointment.

## 2020-08-22 NOTE — ED Notes (Signed)
Ambulated to restroom without difficulty.

## 2020-08-22 NOTE — Discharge Instructions (Addendum)
Your medications today were refilled.  You need to follow-up with a primary care doctor as we cannot keep refilling medications here in the ER.  Please call the phone number of Dr. Criss Rosales or [facility] community health and wellness which is a free clinic here in the area.  You need to see a primary care doctor so that they can follow your chronic health problems.  The ER is not a good place to continue having your medications refilled.  Return to the ER for any new or concerning symptoms.

## 2020-08-22 NOTE — ED Triage Notes (Addendum)
Patient here to get his medication refilled, however is pulse is slightly elevated in triage
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
## 2020-09-06 ENCOUNTER — Inpatient Hospital Stay: Payer: Medicaid Other | Admitting: Physician Assistant

## 2020-09-15 ENCOUNTER — Encounter (HOSPITAL_COMMUNITY): Payer: Self-pay | Admitting: Emergency Medicine

## 2020-09-15 ENCOUNTER — Other Ambulatory Visit: Payer: Self-pay

## 2020-09-15 ENCOUNTER — Emergency Department (HOSPITAL_COMMUNITY)
Admission: EM | Admit: 2020-09-15 | Discharge: 2020-09-15 | Disposition: A | Discharge status: Home / Self Care | Payer: Medicaid Other | Attending: Emergency Medicine | Admitting: Emergency Medicine

## 2020-09-15 DIAGNOSIS — Z930 Tracheostomy status: Secondary | ICD-10-CM | POA: Insufficient documentation

## 2020-09-15 DIAGNOSIS — R000 Tachycardia, unspecified: Secondary | ICD-10-CM | POA: Diagnosis not present

## 2020-09-15 DIAGNOSIS — Z7902 Long term (current) use of antithrombotics/antiplatelets: Secondary | ICD-10-CM | POA: Diagnosis not present

## 2020-09-15 DIAGNOSIS — F17210 Nicotine dependence, cigarettes, uncomplicated: Secondary | ICD-10-CM | POA: Diagnosis not present

## 2020-09-15 DIAGNOSIS — I69328 Other speech and language deficits following cerebral infarction: Secondary | ICD-10-CM | POA: Insufficient documentation

## 2020-09-15 DIAGNOSIS — I69398 Other sequelae of cerebral infarction: Secondary | ICD-10-CM | POA: Insufficient documentation

## 2020-09-15 DIAGNOSIS — I10 Essential (primary) hypertension: Secondary | ICD-10-CM | POA: Diagnosis not present

## 2020-09-15 DIAGNOSIS — Z760 Encounter for issue of repeat prescription: Secondary | ICD-10-CM | POA: Insufficient documentation

## 2020-09-15 DIAGNOSIS — Z79899 Other long term (current) drug therapy: Secondary | ICD-10-CM | POA: Diagnosis not present

## 2020-09-15 MED ORDER — CLOPIDOGREL BISULFATE 75 MG PO TABS: 75.0000 mg | ORAL_TABLET | Freq: Every day | ORAL | 0 refills | Status: DC

## 2020-09-15 MED ORDER — LEVETIRACETAM 500 MG PO TABS: 500.0000 mg | ORAL_TABLET | Freq: Two times a day (BID) | ORAL | 0 refills | Status: DC

## 2020-09-15 NOTE — Discharge Instructions (Signed)
Please follow-up with the [facility] and wellness clinic.
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
## 2020-09-15 NOTE — ED Triage Notes (Signed)
Patient requesting keppra refill.

## 2021-01-11 ENCOUNTER — Other Ambulatory Visit: Payer: Self-pay

## 2021-01-11 ENCOUNTER — Encounter (HOSPITAL_COMMUNITY): Payer: Self-pay | Admitting: Pharmacy Technician

## 2021-01-11 ENCOUNTER — Emergency Department (HOSPITAL_COMMUNITY)
Admission: EM | Admit: 2021-01-11 | Discharge: 2021-01-11 | Disposition: A | Discharge status: Home / Self Care | Payer: Medicaid Other | Attending: Emergency Medicine | Admitting: Emergency Medicine

## 2021-01-11 DIAGNOSIS — Z91040 Latex allergy status: Secondary | ICD-10-CM | POA: Insufficient documentation

## 2021-01-11 DIAGNOSIS — I10 Essential (primary) hypertension: Secondary | ICD-10-CM | POA: Insufficient documentation

## 2021-01-11 DIAGNOSIS — F17210 Nicotine dependence, cigarettes, uncomplicated: Secondary | ICD-10-CM | POA: Insufficient documentation

## 2021-01-11 DIAGNOSIS — Z8673 Personal history of transient ischemic attack (TIA), and cerebral infarction without residual deficits: Secondary | ICD-10-CM | POA: Insufficient documentation

## 2021-01-11 DIAGNOSIS — Z760 Encounter for issue of repeat prescription: Secondary | ICD-10-CM | POA: Insufficient documentation

## 2021-01-11 DIAGNOSIS — Z7902 Long term (current) use of antithrombotics/antiplatelets: Secondary | ICD-10-CM | POA: Insufficient documentation

## 2021-01-11 DIAGNOSIS — Z79899 Other long term (current) drug therapy: Secondary | ICD-10-CM | POA: Diagnosis not present

## 2021-01-11 MED ORDER — AMLODIPINE BESYLATE 10 MG PO TABS: 10.0000 mg | ORAL_TABLET | Freq: Every day | ORAL | 0 refills | Status: DC

## 2021-01-11 MED ORDER — LEVETIRACETAM 500 MG PO TABS: 500.0000 mg | ORAL_TABLET | Freq: Two times a day (BID) | ORAL | 0 refills | Status: DC

## 2021-01-11 MED ORDER — CLOPIDOGREL BISULFATE 75 MG PO TABS: 75.0000 mg | ORAL_TABLET | Freq: Every day | ORAL | 0 refills | Status: AC

## 2021-01-11 NOTE — ED Triage Notes (Signed)
Pt here to get his medications refilled. States he does not have a PCP.
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
## 2021-01-11 NOTE — ED Notes (Signed)
Medication needing refilled is keppra 500mg , amlodipine 10mg  and plavix 75mg .

## 2021-01-11 NOTE — Discharge Instructions (Addendum)
Seen here for medication refill.  I have refilled all your medications.  Please take as prescribed.  I need you to go to [facility] and wellness to schedule an appointment.  You must go to facility.  They will help you find a primary care provider and start refilling her medications.  Come back to the emergency department if you develop chest pain, shortness of breath, severe abdominal pain, uncontrolled nausea, vomiting, diarrhea.

## 2021-02-26 ENCOUNTER — Encounter (HOSPITAL_COMMUNITY): Payer: Self-pay | Admitting: Student

## 2021-02-26 ENCOUNTER — Other Ambulatory Visit: Payer: Self-pay

## 2021-02-26 ENCOUNTER — Emergency Department (HOSPITAL_COMMUNITY)
Admission: EM | Admit: 2021-02-26 | Discharge: 2021-02-26 | Disposition: A | Discharge status: Home / Self Care | Payer: Medicaid Other | Attending: Emergency Medicine | Admitting: Emergency Medicine

## 2021-02-26 DIAGNOSIS — F17210 Nicotine dependence, cigarettes, uncomplicated: Secondary | ICD-10-CM | POA: Diagnosis not present

## 2021-02-26 DIAGNOSIS — R000 Tachycardia, unspecified: Secondary | ICD-10-CM | POA: Diagnosis not present

## 2021-02-26 DIAGNOSIS — Z79899 Other long term (current) drug therapy: Secondary | ICD-10-CM | POA: Diagnosis not present

## 2021-02-26 DIAGNOSIS — Z91040 Latex allergy status: Secondary | ICD-10-CM | POA: Insufficient documentation

## 2021-02-26 DIAGNOSIS — Z7902 Long term (current) use of antithrombotics/antiplatelets: Secondary | ICD-10-CM | POA: Diagnosis not present

## 2021-02-26 DIAGNOSIS — Z760 Encounter for issue of repeat prescription: Secondary | ICD-10-CM

## 2021-02-26 DIAGNOSIS — I10 Essential (primary) hypertension: Secondary | ICD-10-CM | POA: Diagnosis not present

## 2021-02-26 LAB — BASIC METABOLIC PANEL
Anion gap: 7 (ref 5–15)
BUN: 5 mg/dL — ABNORMAL LOW (ref 6–20)
CO2: 28 mmol/L (ref 22–32)
Calcium: 9.4 mg/dL (ref 8.9–10.3)
Chloride: 103 mmol/L (ref 98–111)
Creatinine, Ser: 1.01 mg/dL (ref 0.61–1.24)
GFR, Estimated: >60 mL/min (ref >60)
Glucose, Bld: 114 mg/dL — ABNORMAL HIGH (ref 70–99)
Potassium: 4.2 mmol/L (ref 3.5–5.1)
Sodium: 138 mmol/L (ref 135–145)

## 2021-02-26 LAB — CBC
HCT: 44.4 % (ref 39.0–52.0)
Hemoglobin: 14.6 g/dL (ref 13.0–17.0)
MCH: 30.5 pg (ref 26.0–34.0)
MCHC: 32.9 g/dL (ref 30.0–36.0)
MCV: 92.7 fL (ref 80.0–100.0)
Platelets: 237 10*3/uL (ref 150–400)
RBC: 4.79 MIL/uL (ref 4.22–5.81)
RDW: 13.5 % (ref 11.5–15.5)
WBC: 4.5 10*3/uL (ref 4.0–10.5)
nRBC: 0 % (ref 0.0–0.2)

## 2021-02-26 MED ORDER — CLOPIDOGREL BISULFATE 75 MG PO TABS: 75.0000 mg | ORAL_TABLET | Freq: Every day | ORAL | 0 refills | Status: DC

## 2021-02-26 MED ORDER — LEVETIRACETAM 500 MG PO TABS: 500.0000 mg | ORAL_TABLET | Freq: Two times a day (BID) | ORAL | 0 refills | Status: DC

## 2021-02-26 MED ORDER — AMLODIPINE BESYLATE 10 MG PO TABS: 10.0000 mg | ORAL_TABLET | Freq: Every day | ORAL | 0 refills | Status: DC

## 2021-02-26 MED ORDER — SODIUM CHLORIDE 0.9 % IV BOLUS
1000.0000 mL | Freq: Once | INTRAVENOUS | Status: AC
  Administered 2021-02-26: 1000 mL via INTRAVENOUS

## 2021-02-26 NOTE — Discharge Instructions (Signed)
You were seen in the emergency department today for a medication refill.  Your heart rate was noted to be elevated, you received fluids for this.  Please be sure to stay well-hydrated.  We have provided refills of your medications.  We have set up an appointment with Dr. Criss Rosales, please see details for your appointment 03/27/2021 at 10:45 AM, please arrive 15 minutes early to update paperwork.  Is extremely important that you go to his appointment.   Follow-up with primary care for continued refills of your medications.  Return to the ER for new or worsening symptoms including but not limited to fever, chest pain, trouble breathing, abdominal pain, inability to keep fluids down, or any other concerns.
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
## 2021-02-26 NOTE — ED Notes (Signed)
Patient discharge instructions reviewed with the patient. The patient verbalized understanding. Patient discharged.

## 2021-02-26 NOTE — ED Triage Notes (Signed)
Pt arrived for medication refill (Clp[odogrel 75mg , Amlodipine 10mg , Levetiracetam 500mg ). Pt has been out for 2 weeks.

## 2021-05-02 ENCOUNTER — Encounter (HOSPITAL_COMMUNITY): Payer: Self-pay | Admitting: Emergency Medicine

## 2021-05-02 ENCOUNTER — Emergency Department (HOSPITAL_COMMUNITY)
Admission: EM | Admit: 2021-05-02 | Discharge: 2021-05-02 | Disposition: A | Discharge status: Home / Self Care | Payer: Medicaid Other | Attending: Emergency Medicine | Admitting: Emergency Medicine

## 2021-05-02 DIAGNOSIS — F17210 Nicotine dependence, cigarettes, uncomplicated: Secondary | ICD-10-CM | POA: Diagnosis not present

## 2021-05-02 DIAGNOSIS — Z79899 Other long term (current) drug therapy: Secondary | ICD-10-CM | POA: Diagnosis not present

## 2021-05-02 DIAGNOSIS — Z91040 Latex allergy status: Secondary | ICD-10-CM | POA: Insufficient documentation

## 2021-05-02 DIAGNOSIS — I10 Essential (primary) hypertension: Secondary | ICD-10-CM | POA: Insufficient documentation

## 2021-05-02 DIAGNOSIS — Z760 Encounter for issue of repeat prescription: Secondary | ICD-10-CM | POA: Diagnosis not present

## 2021-05-02 MED ORDER — AMLODIPINE BESYLATE 10 MG PO TABS: 10.0000 mg | ORAL_TABLET | Freq: Every day | ORAL | 0 refills | Status: DC

## 2021-05-02 MED ORDER — CLOPIDOGREL BISULFATE 75 MG PO TABS: 75.0000 mg | ORAL_TABLET | Freq: Every day | ORAL | 0 refills | Status: DC

## 2021-05-02 MED ORDER — LEVETIRACETAM 500 MG PO TABS: 500.0000 mg | ORAL_TABLET | Freq: Two times a day (BID) | ORAL | 0 refills | Status: DC
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
## 2021-05-02 NOTE — Discharge Instructions (Addendum)
Call today to make an appointment with primary care provider.

## 2021-05-02 NOTE — ED Triage Notes (Signed)
Pt requesting medication refills for his regular daily medications. No distress noted, no other complaints.

## 2022-01-02 ENCOUNTER — Emergency Department (HOSPITAL_COMMUNITY)
Admission: EM | Admit: 2022-01-02 | Discharge: 2022-01-02 | Disposition: A | Discharge status: Home / Self Care | Payer: Medicaid Other | Attending: Emergency Medicine | Admitting: Emergency Medicine

## 2022-01-02 ENCOUNTER — Encounter (HOSPITAL_COMMUNITY): Payer: Self-pay | Admitting: Emergency Medicine

## 2022-01-02 DIAGNOSIS — Z7902 Long term (current) use of antithrombotics/antiplatelets: Secondary | ICD-10-CM | POA: Insufficient documentation

## 2022-01-02 DIAGNOSIS — Z91040 Latex allergy status: Secondary | ICD-10-CM | POA: Diagnosis not present

## 2022-01-02 DIAGNOSIS — Z760 Encounter for issue of repeat prescription: Secondary | ICD-10-CM | POA: Insufficient documentation

## 2022-01-02 DIAGNOSIS — Z79899 Other long term (current) drug therapy: Secondary | ICD-10-CM | POA: Insufficient documentation

## 2022-01-02 DIAGNOSIS — I10 Essential (primary) hypertension: Secondary | ICD-10-CM | POA: Diagnosis not present

## 2022-01-02 MED ORDER — CLOPIDOGREL BISULFATE 75 MG PO TABS: 75.0000 mg | ORAL_TABLET | Freq: Every day | ORAL | 0 refills | Status: DC

## 2022-01-02 MED ORDER — AMLODIPINE BESYLATE 10 MG PO TABS: 10.0000 mg | ORAL_TABLET | Freq: Every day | ORAL | 0 refills | Status: DC

## 2022-01-02 MED ORDER — LEVETIRACETAM 500 MG PO TABS: 500.0000 mg | ORAL_TABLET | Freq: Two times a day (BID) | ORAL | 0 refills | Status: DC

## 2022-01-02 NOTE — Discharge Instructions (Signed)
As we discussed, it is very important that you establish care with a primary care doctor to refill your medications and manage your care further.  The emergency department is not the place for this.  I have included information for the wellness center to your discharge paperwork where you can follow-up. I have refilled your medications today, please take them as prescribed.  Return if development of any new or worsening symptoms.

## 2022-01-02 NOTE — ED Provider Notes (Signed)
[facility] EMERGENCY DEPARTMENT Provider Note   CSN: [id number] Arrival date & time: 01/02/22  1323     History  Chief Complaint  Patient presents with   Medication Refill    Arthur Beasley is a 41 y.o. male.  Patient with a history of CVA, bipolar, HTN, presenting to the ED with request for refills of Plavix, Keppra, Norvasc.  He states he has run out of these medications.  He has presented here several times with similar complaints.  States that he is made no attempts to find a primary care doctor, unable to tell me why.  Denies chest pain, new seizures, syncope, dizziness, or any other complaints.  The history is provided by the patient. No language interpreter was used.  Medication Refill     Home Medications Prior to Admission medications   Medication Sig Start Date End Date Taking? Authorizing Provider  amLODipine (NORVASC) 10 MG tablet Take 1 tablet (10 mg total) by mouth daily. 05/02/21 06/01/21  Joy, Shawn C, PA-C  azithromycin (ZITHROMAX) 250 MG tablet Take 1 tablet (250 mg total) by mouth daily. 01/16/19   Hayden Rasmussen, MD  baclofen (LIORESAL) 10 MG tablet Take 1 tablet (10 mg total) by mouth 3 (three) times daily. Patient not taking: Reported on 01/16/2019 12/14/17   Robyn Haber, MD  clopidogrel (PLAVIX) 75 MG tablet Take 1 tablet (75 mg total) by mouth daily. 05/02/21   Joy, Shawn C, PA-C  hydrochlorothiazide (HYDRODIURIL) 12.5 MG tablet Take 1 tablet (12.5 mg total) by mouth daily. 10/28/19   Henderly, Britni A, PA-C  levETIRAcetam (KEPPRA) 500 MG tablet Take 1 tablet (500 mg total) by mouth 2 (two) times daily. 05/02/21 06/01/21  Joy, Shawn C, PA-C  valsartan (DIOVAN) 40 MG tablet Take 1 tablet (40 mg total) by mouth daily. 08/22/20   Garald Balding, PA-C      Allergies    Latex and Penicillins    Review of Systems   Review of Systems  Constitutional:  Negative for chills, fatigue and fever.  HENT:  Negative for congestion, postnasal  drip, rhinorrhea, sore throat, trouble swallowing and voice change.   Respiratory:  Negative for cough and shortness of breath.   Cardiovascular:  Negative for chest pain, palpitations and leg swelling.  Gastrointestinal:  Negative for abdominal distention, abdominal pain, constipation, diarrhea, nausea and vomiting.  Neurological:  Negative for dizziness, tremors, seizures, syncope, facial asymmetry, speech difficulty, weakness, light-headedness, numbness and headaches.  Psychiatric/Behavioral:  Negative for confusion and decreased concentration.   All other systems reviewed and are negative.  Physical Exam Updated Vital Signs BP (!) 129/98 (BP Location: Left Arm)    Pulse 93    Temp 97.9 F (36.6 C) (Oral)    Resp 17    SpO2 98%  Physical Exam Vitals and nursing note reviewed.  Constitutional:      General: He is not in acute distress.    Appearance: Normal appearance. He is normal weight. He is not ill-appearing, toxic-appearing or diaphoretic.  HENT:     Head: Normocephalic and atraumatic.  Cardiovascular:     Rate and Rhythm: Normal rate.  Pulmonary:     Effort: Pulmonary effort is normal. No respiratory distress.  Musculoskeletal:        General: Normal range of motion.     Cervical back: Normal range of motion.  Skin:    General: Skin is warm and dry.  Neurological:     General: No focal deficit present.  Mental Status: He is alert.  Psychiatric:        Mood and Affect: Mood normal.        Behavior: Behavior normal.    ED Results / Procedures / Treatments   Labs (all labs ordered are listed, but only abnormal results are displayed) Labs Reviewed - No data to display  EKG None  Radiology No results found.  Procedures Procedures    Medications Ordered in ED Medications - No data to display  ED Course/ Medical Decision Making/ A&P                           Medical Decision Making  Patient presents requesting refills of his medications.  He has been  seen here several times with similar complaints.  He has yet to find a primary care doctor to assist with further management of these medications.  I have with him the importance of establishing care somewhere with someone who can see him regularly.  He expresses understanding.  Will include information and discharge paperwork.  He is afebrile, nontoxic-appearing, and in no acute distress with reassuring vital signs.  Stable for discharge at this time.  Educated on red flag symptoms of prompt immediate return.  Discharged in stable condition.   Final Clinical Impression(s) / ED Diagnoses Final diagnoses:  Medication refill    Rx / DC Orders ED Discharge Orders          Ordered    amLODipine (NORVASC) 10 MG tablet  Daily        01/02/22 1422    levETIRAcetam (KEPPRA) 500 MG tablet  2 times daily        01/02/22 1422    clopidogrel (PLAVIX) 75 MG tablet  Daily        01/02/22 1422          An After Visit Summary was printed and given to the patient.     Nestor Lewandowsky 01/02/22 Herscher, Ankit, MD 01/03/22 [phone]

## 2022-01-02 NOTE — ED Triage Notes (Signed)
Patient here requesting refill of medications. Patient denies having any complaints at this time. Patient is alert and in no apparent distress at this time.

## 2022-02-18 ENCOUNTER — Emergency Department (HOSPITAL_COMMUNITY)
Admission: EM | Admit: 2022-02-18 | Discharge: 2022-02-18 | Disposition: A | Discharge status: Home / Self Care | Payer: Medicaid Other | Attending: Emergency Medicine | Admitting: Emergency Medicine

## 2022-02-18 ENCOUNTER — Other Ambulatory Visit: Payer: Self-pay

## 2022-02-18 DIAGNOSIS — Z7901 Long term (current) use of anticoagulants: Secondary | ICD-10-CM | POA: Insufficient documentation

## 2022-02-18 DIAGNOSIS — I10 Essential (primary) hypertension: Secondary | ICD-10-CM | POA: Diagnosis not present

## 2022-02-18 DIAGNOSIS — Z760 Encounter for issue of repeat prescription: Secondary | ICD-10-CM | POA: Diagnosis present

## 2022-02-18 DIAGNOSIS — Z79899 Other long term (current) drug therapy: Secondary | ICD-10-CM | POA: Insufficient documentation

## 2022-02-18 DIAGNOSIS — Z91040 Latex allergy status: Secondary | ICD-10-CM | POA: Insufficient documentation

## 2022-02-18 MED ORDER — CLOPIDOGREL BISULFATE 75 MG PO TABS: 75.0000 mg | ORAL_TABLET | Freq: Every day | ORAL | 0 refills | Status: DC

## 2022-02-18 MED ORDER — LEVETIRACETAM 500 MG PO TABS: 500.0000 mg | ORAL_TABLET | Freq: Two times a day (BID) | ORAL | 0 refills | Status: DC

## 2022-02-18 MED ORDER — AMLODIPINE BESYLATE 10 MG PO TABS: 10.0000 mg | ORAL_TABLET | Freq: Every day | ORAL | 0 refills | Status: DC

## 2022-02-18 NOTE — ED Triage Notes (Signed)
Pt. Stated, Out of my medication for 2 months Theres 3 ?
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
## 2022-02-18 NOTE — ED Provider Notes (Signed)
?[facility] ?Provider Note ? ? ?CSN: [id number] ?Arrival date & time: 02/18/22  0725 ? ?  ? ?History ? ?Chief Complaint  ?Patient presents with  ? Medication Refill  ? ? ?Arthur Beasley is a 41 y.o. male. ? ? ?Medication Refill ? ?41 year old male with a history of CVA, bipolar disorder, HTN presenting to the emergency department with request for medication refill of his Plavix, Keppra and Norvasc.  The patient was seen previously on 01/02/2022 for the same and has been seen over the course of 2 years multiple times for similar medication refills.  He has yet to establish care with a PCP.  He states that he has run out of these medications.  He denies any symptoms at this time. ? ?Home Medications ?Prior to Admission medications   ?Medication Sig Start Date End Date Taking? Authorizing Provider  ?amLODipine (NORVASC) 10 MG tablet Take 1 tablet (10 mg total) by mouth daily. 02/18/22 03/20/22  Regan Lemming, MD  ?clopidogrel (PLAVIX) 75 MG tablet Take 1 tablet (75 mg total) by mouth daily. 02/18/22   Regan Lemming, MD  ?hydrochlorothiazide (HYDRODIURIL) 12.5 MG tablet Take 1 tablet (12.5 mg total) by mouth daily. 10/28/19   Henderly, Britni A, PA-C  ?levETIRAcetam (KEPPRA) 500 MG tablet Take 1 tablet (500 mg total) by mouth 2 (two) times daily. 02/18/22 03/20/22  Regan Lemming, MD  ?valsartan (DIOVAN) 40 MG tablet Take 1 tablet (40 mg total) by mouth daily. 08/22/20   Garald Balding, PA-C  ?   ? ?Allergies    ?Latex and Penicillins   ? ?Review of Systems   ?Review of Systems  ?All other systems reviewed and are negative. ? ?Physical Exam ?Updated Vital Signs ?BP 111/90 (BP Location: Left Arm)   Pulse 92   Temp 98.1 ?F (36.7 ?C) (Oral)   Resp 16   SpO2 100%  ?Physical Exam ?Vitals and nursing note reviewed.  ?Constitutional:   ?   General: He is not in acute distress. ?HENT:  ?   Head: Normocephalic and atraumatic.  ?Eyes:  ?   Conjunctiva/sclera: Conjunctivae normal.  ?   Pupils:  Pupils are equal, round, and reactive to light.  ?Cardiovascular:  ?   Rate and Rhythm: Normal rate and regular rhythm.  ?Pulmonary:  ?   Effort: Pulmonary effort is normal. No respiratory distress.  ?Abdominal:  ?   General: There is no distension.  ?   Tenderness: There is no guarding.  ?Musculoskeletal:     ?   General: No deformity or signs of injury.  ?   Cervical back: Neck supple.  ?Skin: ?   Findings: No lesion or rash.  ?Neurological:  ?   General: No focal deficit present.  ?   Mental Status: He is alert and oriented to person, place, and time. Mental status is at baseline.  ? ? ?ED Results / Procedures / Treatments   ?Labs ?(all labs ordered are listed, but only abnormal results are displayed) ?Labs Reviewed - No data to display ? ?EKG ?None ? ?Radiology ?No results found. ? ?Procedures ?Procedures  ? ? ?Medications Ordered in ED ?Medications - No data to display ? ?ED Course/ Medical Decision Making/ A&P ?  ?                        ?Medical Decision Making ?Risk ?Prescription drug management. ? ? ?41 year old male with a history of CVA, bipolar disorder, HTN presenting to  the emergency department with request for medication refill of his Plavix, Keppra and Norvasc.  The patient was seen previously on 01/02/2022 for the same and has been seen over the course of 2 years multiple times for similar medication refills.  He has yet to establish care with a PCP.  He states that he has run out of these medications.  He denies any symptoms at this time. ? ?Vitally stable without complaint.  Reassuring exam.  TOC consult was placed to assist the patient with medication management and establishing care with a PCP.  Overall stable for discharge at this time. ? ? ?Final Clinical Impression(s) / ED Diagnoses ?Final diagnoses:  ?Encounter for medication refill  ? ? ?Rx / DC Orders ?ED Discharge Orders   ? ?      Ordered  ?  amLODipine (NORVASC) 10 MG tablet  Daily       ? 02/18/22 4742  ?  clopidogrel (PLAVIX) 75 MG  tablet  Daily       ? 02/18/22 5956  ?  levETIRAcetam (KEPPRA) 500 MG tablet  2 times daily       ? 02/18/22 3875  ? ?  ?  ? ?  ? ? ?  ?Regan Lemming, MD ?02/18/22 [phone] ? ?

## 2022-02-18 NOTE — Discharge Instructions (Addendum)
Recommend you establish care with a primary care physician.  ?

## 2022-02-18 NOTE — ED Notes (Signed)
Patient verbalizes understanding of discharge instructions. Opportunity for questioning and answers were provided. Pt discharged from ED. 

## 2022-03-04 ENCOUNTER — Ambulatory Visit: Payer: Medicaid Other | Admitting: Internal Medicine

## 2022-04-21 ENCOUNTER — Encounter (HOSPITAL_COMMUNITY): Payer: Self-pay

## 2022-04-21 ENCOUNTER — Emergency Department (HOSPITAL_COMMUNITY): Payer: Medicaid Other

## 2022-04-21 ENCOUNTER — Other Ambulatory Visit: Payer: Self-pay

## 2022-04-21 ENCOUNTER — Emergency Department (HOSPITAL_COMMUNITY)
Admission: EM | Admit: 2022-04-21 | Discharge: 2022-04-22 | Disposition: A | Discharge status: Home / Self Care | Payer: Medicaid Other | Attending: Emergency Medicine | Admitting: Emergency Medicine

## 2022-04-21 DIAGNOSIS — Z8673 Personal history of transient ischemic attack (TIA), and cerebral infarction without residual deficits: Secondary | ICD-10-CM | POA: Diagnosis not present

## 2022-04-21 DIAGNOSIS — F17210 Nicotine dependence, cigarettes, uncomplicated: Secondary | ICD-10-CM | POA: Insufficient documentation

## 2022-04-21 DIAGNOSIS — S40811A Abrasion of right upper arm, initial encounter: Secondary | ICD-10-CM | POA: Insufficient documentation

## 2022-04-21 DIAGNOSIS — M79609 Pain in unspecified limb: Secondary | ICD-10-CM

## 2022-04-21 DIAGNOSIS — I10 Essential (primary) hypertension: Secondary | ICD-10-CM | POA: Insufficient documentation

## 2022-04-21 DIAGNOSIS — X58XXXA Exposure to other specified factors, initial encounter: Secondary | ICD-10-CM | POA: Insufficient documentation
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
## 2022-04-22 ENCOUNTER — Emergency Department (HOSPITAL_COMMUNITY): Payer: Medicaid Other

## 2022-04-22 NOTE — ED Notes (Signed)
Attempted to get pt to ambulate. Pt closed his eyes and turned his head away from RN.

## 2022-04-22 NOTE — ED Notes (Signed)
Discharge instructions reviewed. Pt states understanding and no further questions. Pt ambulatory upon discharge. No s/s of distress noted.
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
## 2022-04-22 NOTE — Discharge Instructions (Signed)
You were evaluated in the Emergency Department and after careful evaluation, we did not find any emergent condition requiring admission or further testing in the hospital.  Your exam/testing today is overall reassuring.  X-rays without broken bones.  Suspect sprain, use the sling provided as needed for comfort, follow-up with regular doctor.  Please return to the Emergency Department if you experience any worsening of your condition.   Thank you for allowing [place] to be a part of your care.

## 2022-04-30 ENCOUNTER — Emergency Department (HOSPITAL_COMMUNITY): Payer: Medicaid Other

## 2022-04-30 ENCOUNTER — Other Ambulatory Visit: Payer: Self-pay

## 2022-04-30 ENCOUNTER — Emergency Department (HOSPITAL_COMMUNITY)
Admission: EM | Admit: 2022-04-30 | Discharge: 2022-04-30 | Disposition: A | Discharge status: Home / Self Care | Payer: Medicaid Other | Attending: Emergency Medicine | Admitting: Emergency Medicine

## 2022-04-30 DIAGNOSIS — M25512 Pain in left shoulder: Secondary | ICD-10-CM | POA: Diagnosis not present

## 2022-04-30 DIAGNOSIS — S0990XA Unspecified injury of head, initial encounter: Secondary | ICD-10-CM | POA: Diagnosis present

## 2022-04-30 DIAGNOSIS — M79601 Pain in right arm: Secondary | ICD-10-CM | POA: Diagnosis not present

## 2022-04-30 DIAGNOSIS — S0001XA Abrasion of scalp, initial encounter: Secondary | ICD-10-CM | POA: Insufficient documentation

## 2022-04-30 DIAGNOSIS — T07XXXA Unspecified multiple injuries, initial encounter: Secondary | ICD-10-CM

## 2022-04-30 DIAGNOSIS — R0789 Other chest pain: Secondary | ICD-10-CM | POA: Insufficient documentation

## 2022-04-30 DIAGNOSIS — Z7902 Long term (current) use of antithrombotics/antiplatelets: Secondary | ICD-10-CM | POA: Diagnosis not present

## 2022-04-30 DIAGNOSIS — Z79899 Other long term (current) drug therapy: Secondary | ICD-10-CM | POA: Diagnosis not present

## 2022-04-30 DIAGNOSIS — S0003XA Contusion of scalp, initial encounter: Secondary | ICD-10-CM | POA: Diagnosis not present

## 2022-04-30 DIAGNOSIS — Z91040 Latex allergy status: Secondary | ICD-10-CM | POA: Insufficient documentation

## 2022-04-30 DIAGNOSIS — T148XXA Other injury of unspecified body region, initial encounter: Secondary | ICD-10-CM

## 2022-04-30 DIAGNOSIS — I10 Essential (primary) hypertension: Secondary | ICD-10-CM | POA: Insufficient documentation

## 2022-04-30 MED ORDER — ACETAMINOPHEN 500 MG PO TABS
1000.0000 mg | ORAL_TABLET | Freq: Once | ORAL | Status: AC
  Administered 2022-04-30: 1000 mg via ORAL
  Filled 2022-04-30: qty 2

## 2022-04-30 MED ORDER — FENTANYL CITRATE PF 50 MCG/ML IJ SOSY
25.0000 ug | PREFILLED_SYRINGE | Freq: Once | INTRAMUSCULAR | Status: AC
  Administered 2022-04-30: 25 ug via INTRAMUSCULAR
  Filled 2022-04-30: qty 1

## 2022-04-30 NOTE — ED Notes (Signed)
Patient verbalizes understanding of discharge instructions. Opportunity for questioning and answers were provided. Armband removed by staff, pt discharged from ED via wheelchair.  
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
## 2022-04-30 NOTE — ED Notes (Signed)
Pt taken to CT.

## 2022-04-30 NOTE — ED Notes (Signed)
Bacitracin and dressing applied to pt wound
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
## 2022-06-10 ENCOUNTER — Ambulatory Visit (HOSPITAL_COMMUNITY)
Admission: EM | Admit: 2022-06-10 | Discharge: 2022-06-11 | Disposition: A | Discharge status: Home / Self Care | Payer: Medicaid Other | Attending: Nurse Practitioner | Admitting: Nurse Practitioner

## 2022-06-10 DIAGNOSIS — F3160 Bipolar disorder, current episode mixed, unspecified: Secondary | ICD-10-CM | POA: Insufficient documentation

## 2022-06-10 DIAGNOSIS — Z20822 Contact with and (suspected) exposure to covid-19: Secondary | ICD-10-CM | POA: Insufficient documentation

## 2022-06-10 DIAGNOSIS — F17210 Nicotine dependence, cigarettes, uncomplicated: Secondary | ICD-10-CM | POA: Insufficient documentation

## 2022-06-10 DIAGNOSIS — F17200 Nicotine dependence, unspecified, uncomplicated: Secondary | ICD-10-CM

## 2022-06-10 DIAGNOSIS — Z046 Encounter for general psychiatric examination, requested by authority: Secondary | ICD-10-CM

## 2022-06-10 DIAGNOSIS — F1020 Alcohol dependence, uncomplicated: Secondary | ICD-10-CM | POA: Insufficient documentation

## 2022-06-10 LAB — POCT URINE DRUG SCREEN - MANUAL ENTRY (I-SCREEN)
POC Amphetamine UR: NOT DETECTED
POC Buprenorphine (BUP): NOT DETECTED
POC Cocaine UR: NOT DETECTED
POC Marijuana UR: POSITIVE — AB
POC Methadone UR: NOT DETECTED
POC Methamphetamine UR: NOT DETECTED
POC Morphine: NOT DETECTED
POC Oxazepam (BZO): NOT DETECTED
POC Oxycodone UR: NOT DETECTED
POC Secobarbital (BAR): NOT DETECTED

## 2022-06-10 LAB — POC SARS CORONAVIRUS 2 AG: SARSCOV2ONAVIRUS 2 AG: NEGATIVE

## 2022-06-10 MED ORDER — LORAZEPAM 1 MG PO TABS: 1.0000 mg | ORAL_TABLET | Freq: Four times a day (QID) | ORAL | Status: DC | PRN

## 2022-06-10 MED ORDER — CLONIDINE HCL 0.1 MG PO TABS
0.2000 mg | ORAL_TABLET | Freq: Once | ORAL | Status: AC
  Administered 2022-06-10: 0.2 mg via ORAL
  Filled 2022-06-10: qty 2

## 2022-06-10 MED ORDER — ALUM & MAG HYDROXIDE-SIMETH 200-200-20 MG/5ML PO SUSP: 30.0000 mL | ORAL | Status: DC | PRN

## 2022-06-10 MED ORDER — THIAMINE HCL 100 MG PO TABS
100.0000 mg | ORAL_TABLET | Freq: Every day | ORAL | Status: DC
  Filled 2022-06-10: qty 1

## 2022-06-10 MED ORDER — IRBESARTAN 75 MG PO TABS
150.0000 mg | ORAL_TABLET | Freq: Every day | ORAL | Status: DC
  Filled 2022-06-10: qty 2

## 2022-06-10 MED ORDER — THIAMINE HCL 100 MG/ML IJ SOLN
100.0000 mg | Freq: Once | INTRAMUSCULAR | Status: DC
  Filled 2022-06-10: qty 2

## 2022-06-10 MED ORDER — ONDANSETRON 4 MG PO TBDP: 4.0000 mg | ORAL_TABLET | Freq: Four times a day (QID) | ORAL | Status: DC | PRN

## 2022-06-10 MED ORDER — MAGNESIUM HYDROXIDE 400 MG/5ML PO SUSP: 30.0000 mL | Freq: Every day | ORAL | Status: DC | PRN

## 2022-06-10 MED ORDER — CLOPIDOGREL BISULFATE 75 MG PO TABS
75.0000 mg | ORAL_TABLET | Freq: Every day | ORAL | Status: DC
  Administered 2022-06-10: 75 mg via ORAL
  Filled 2022-06-10 (×2): qty 1

## 2022-06-10 MED ORDER — LORAZEPAM 1 MG PO TABS: 1.0000 mg | ORAL_TABLET | Freq: Three times a day (TID) | ORAL | Status: DC

## 2022-06-10 MED ORDER — HYDROCHLOROTHIAZIDE 12.5 MG PO TABS
12.5000 mg | ORAL_TABLET | Freq: Every day | ORAL | Status: DC
  Administered 2022-06-11: 12.5 mg via ORAL
  Filled 2022-06-10: qty 1

## 2022-06-10 MED ORDER — LORAZEPAM 1 MG PO TABS: 1.0000 mg | ORAL_TABLET | Freq: Two times a day (BID) | ORAL | Status: DC

## 2022-06-10 MED ORDER — LEVETIRACETAM 500 MG PO TABS
500.0000 mg | ORAL_TABLET | Freq: Two times a day (BID) | ORAL | Status: DC
  Administered 2022-06-10 – 2022-06-11 (×2): 500 mg via ORAL
  Filled 2022-06-10 (×2): qty 1

## 2022-06-10 MED ORDER — LORAZEPAM 1 MG PO TABS: 1.0000 mg | ORAL_TABLET | Freq: Every day | ORAL | Status: DC

## 2022-06-10 MED ORDER — LORAZEPAM 1 MG PO TABS
1.0000 mg | ORAL_TABLET | Freq: Four times a day (QID) | ORAL | Status: DC
  Administered 2022-06-10: 1 mg via ORAL
  Filled 2022-06-10 (×2): qty 1

## 2022-06-10 MED ORDER — ADULT MULTIVITAMIN W/MINERALS CH
1.0000 | ORAL_TABLET | Freq: Every day | ORAL | Status: DC
  Administered 2022-06-10: 1 via ORAL
  Filled 2022-06-10 (×2): qty 1

## 2022-06-10 MED ORDER — LOPERAMIDE HCL 2 MG PO CAPS: 2.0000 mg | ORAL_CAPSULE | ORAL | Status: DC | PRN

## 2022-06-10 MED ORDER — ACETAMINOPHEN 325 MG PO TABS: 650.0000 mg | ORAL_TABLET | Freq: Four times a day (QID) | ORAL | Status: DC | PRN

## 2022-06-10 MED ORDER — NICOTINE 21 MG/24HR TD PT24
21.0000 mg | MEDICATED_PATCH | Freq: Every day | TRANSDERMAL | Status: DC
  Administered 2022-06-11: 21 mg via TRANSDERMAL
  Filled 2022-06-10: qty 1

## 2022-06-10 MED ORDER — HYDROXYZINE HCL 25 MG PO TABS: 25.0000 mg | ORAL_TABLET | Freq: Four times a day (QID) | ORAL | Status: DC | PRN
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
## 2022-06-11 LAB — COMPREHENSIVE METABOLIC PANEL
ALT: 20 U/L (ref 0–44)
AST: 23 U/L (ref 15–41)
Albumin: 4.7 g/dL (ref 3.5–5.0)
Alkaline Phosphatase: 72 U/L (ref 38–126)
Anion gap: 10 (ref 5–15)
BUN: 7 mg/dL (ref 6–20)
CO2: 29 mmol/L (ref 22–32)
Calcium: 9.6 mg/dL (ref 8.9–10.3)
Chloride: 101 mmol/L (ref 98–111)
Creatinine, Ser: 0.98 mg/dL (ref 0.61–1.24)
GFR, Estimated: >60 mL/min (ref >60)
Glucose, Bld: 61 mg/dL — ABNORMAL LOW (ref 70–99)
Potassium: 3.7 mmol/L (ref 3.5–5.1)
Sodium: 140 mmol/L (ref 135–145)
Total Bilirubin: 0.5 mg/dL (ref 0.3–1.2)
Total Protein: 7.5 g/dL (ref 6.5–8.1)

## 2022-06-11 LAB — LIPID PANEL
Cholesterol: 188 mg/dL (ref 0–200)
HDL: 103 mg/dL (ref >40)
LDL Cholesterol: 74 mg/dL (ref 0–99)
Total CHOL/HDL Ratio: 1.8 RATIO
Triglycerides: 57 mg/dL (ref <150)
VLDL: 11 mg/dL (ref 0–40)

## 2022-06-11 LAB — CBC WITH DIFFERENTIAL/PLATELET
Abs Immature Granulocytes: 0.01 10*3/uL (ref 0.00–0.07)
Basophils Absolute: 0 10*3/uL (ref 0.0–0.1)
Basophils Relative: 1 %
Eosinophils Absolute: 0.2 10*3/uL (ref 0.0–0.5)
Eosinophils Relative: 4 %
HCT: 42 % (ref 39.0–52.0)
Hemoglobin: 14.2 g/dL (ref 13.0–17.0)
Immature Granulocytes: 0 %
Lymphocytes Relative: 22 %
Lymphs Abs: 1.2 10*3/uL (ref 0.7–4.0)
MCH: 31.6 pg (ref 26.0–34.0)
MCHC: 33.8 g/dL (ref 30.0–36.0)
MCV: 93.5 fL (ref 80.0–100.0)
Monocytes Absolute: 0.5 10*3/uL (ref 0.1–1.0)
Monocytes Relative: 10 %
Neutro Abs: 3.3 10*3/uL (ref 1.7–7.7)
Neutrophils Relative %: 63 %
Platelets: 212 10*3/uL (ref 150–400)
RBC: 4.49 MIL/uL (ref 4.22–5.81)
RDW: 13.8 % (ref 11.5–15.5)
WBC: 5.3 10*3/uL (ref 4.0–10.5)
nRBC: 0 % (ref 0.0–0.2)

## 2022-06-11 LAB — RESP PANEL BY RT-PCR (FLU A&B, COVID) ARPGX2
Influenza A by PCR: NEGATIVE
Influenza B by PCR: NEGATIVE
SARS Coronavirus 2 by RT PCR: NEGATIVE

## 2022-06-11 LAB — HEMOGLOBIN A1C
Hgb A1c MFr Bld: 5.6 % (ref 4.8–5.6)
Mean Plasma Glucose: 114.02 mg/dL

## 2022-06-11 LAB — TSH: TSH: 3.492 u[IU]/mL (ref 0.350–4.500)

## 2022-06-11 LAB — ETHANOL: Alcohol, Ethyl (B): <10 mg/dL (ref <10)

## 2022-06-11 NOTE — ED Notes (Signed)
Currently sleeping no disturbed or distressed sleeping patterns noted.

## 2022-06-11 NOTE — Discharge Instructions (Addendum)
Good morning, Arthur Beasley!  It is understood at this time that you are not interested in any type of substance use treatment, it is still a good idea to have a list of references handy in case something changes along the way.  One resource that could be very beneficial to you would be [facility].  They offer an outpatient service known as ACTT Theatre manager) where the team members meet with you in the community and their office for community [place] and psychiatric treatment.  [facility] also has supervised adult housing should you decide to live on your own and need some assistance.  It is important to understand the guidelines for supervised adult living as many have zero tolerance for alcohol or other substance use.  Conservator, museum/gallery at [place]., [place], [place] [address] [phone] phone Go online to [email or web address]     OUTPATIENT SUBSTANCE USE TREATMENT for Medicaid and State Funded/IPRS  [facility] ([facility]) [place], [place], [address] [phone] phone NOTE: [facility] is no longer offering IOP [place].  Serves those who are low-income or have no insurance.  [facility] [address] [place], [place], [address] [phone] phone [phone] fax NOTE: Does have Substance Abuse-Intensive Outpatient Program [facility]) as well as transitional housing if eligible.  Arthur Beas[place], [place], [address] [phone] phone [phone] fax  [place] [phone] [place], [place], [address] [phone] phone [phone] fax  Arthur Beas[place], [place], [address] [phone] phone   [facility] (men only at this campus) [place], [place] [address] [phone]  ((These programs listed above have a one-time application  fee.))   [facility] [place] [address], [place] [address] [phone]  [facility] [place] [address] [place] [address] [phone] II [place] [address] [place], [place] [address] [phone], [place] [address] [phone]  [facility] (Rehab for men only) [address] [place], [place] [address] [phone]    MEDICAL DETOX/RESIDENTIAL TREATMENT- MEDICAID/IPRS:  [facility] [place] [address] [place], [place] [address] [phone]  [facility] [place] [address] [place], [place] [address] [phone]  [place]. [place], [place] [address] [phone]  Arthur [facility] [address] [place], [place] [address] [phone]  Arthur Beas[place], [place] [address] [phone]  ((For admissions to these three [facility] facilities during weekday days and possibly other times, contact [facility] [place] [facility], phone: [phone]; fax: [phone]))  [facility] (detox for men and women) [place], [place] [address] [phone]  [facility] (now accepting [place]) [place] [address] [phone]  [facility] (accept [place] [facility] for some [place]) [address] [place], [place] [address] [phone]   OUTPATIENT PROGRAMS:  [facility] ([facility]) [place], [place] [address] [phone]  ((CD-IOP is currently not operational; Opioid replacement clinic is operational))   [facility] ([place] for [facility] residents) [place],  [address] [phone]  ((CD-IOP; new clients must go through walk-in clinic; NOT CURRENTLY OPERATIONAL))   [facility] (Medicaid for  [facility] LME's; some private insurance) [address], [place] [address] [phone]  ((CD-IOP))   [facility] ([place] insurance) [phone] [address] [place], [place] [address] [phone]  ((CD-IOP (morning & evening programs); individual therapy))   HALFWAY HOUSES:  [facility] [facility] [phone]  [email or web address]    12 STEP PROGRAMS:  Alcoholics Anonymous of [email or web address] [email or web address]  Al-Anon of [employer], [place] [email or web address]-[email or web address]

## 2022-06-11 NOTE — ED Notes (Signed)
Pt sleeping quietly in room.  Breathing even and unlabored . No distress noted.
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
## 2022-06-11 NOTE — BH Assessment (Signed)
LCSW Progress Note   Per Aura Fey, NP, this pt does not require psychiatric hospitalization at this time.  Pt is psychiatrically cleared.  Discharge instructions include several resources for outpatient substance use treatment as well as rehabilitation/detox facilities.  EDP Aura Fey, NP, has been notified.  Omelia Blackwater, MSW, [place] [phone] or [phone]

## 2022-06-11 NOTE — ED Notes (Signed)
Pt is awake and alert.  Flat affect and anxious mood.  Pt denies SI, HI or AVH.  Reports " that I just want to go home".  Pt selectively refused medication. He did take Keppra, HCTZ. Pt given breakfast muffin.

## 2022-07-25 ENCOUNTER — Encounter (HOSPITAL_COMMUNITY): Payer: Self-pay | Admitting: Emergency Medicine

## 2022-07-25 ENCOUNTER — Emergency Department (HOSPITAL_COMMUNITY)
Admission: EM | Admit: 2022-07-25 | Discharge: 2022-07-25 | Disposition: A | Discharge status: Home / Self Care | Payer: Self-pay | Attending: Emergency Medicine | Admitting: Emergency Medicine

## 2022-07-25 DIAGNOSIS — I10 Essential (primary) hypertension: Secondary | ICD-10-CM | POA: Insufficient documentation

## 2022-07-25 DIAGNOSIS — Z760 Encounter for issue of repeat prescription: Secondary | ICD-10-CM | POA: Insufficient documentation

## 2022-07-25 DIAGNOSIS — Z91040 Latex allergy status: Secondary | ICD-10-CM | POA: Insufficient documentation

## 2022-07-25 DIAGNOSIS — Z7902 Long term (current) use of antithrombotics/antiplatelets: Secondary | ICD-10-CM | POA: Insufficient documentation

## 2022-07-25 DIAGNOSIS — Z79899 Other long term (current) drug therapy: Secondary | ICD-10-CM | POA: Insufficient documentation

## 2022-07-25 MED ORDER — LEVETIRACETAM 500 MG PO TABS: 500.0000 mg | ORAL_TABLET | Freq: Two times a day (BID) | ORAL | 0 refills | Status: DC

## 2022-07-25 MED ORDER — CLOPIDOGREL BISULFATE 75 MG PO TABS: 75.0000 mg | ORAL_TABLET | Freq: Every day | ORAL | 0 refills | Status: DC

## 2022-07-25 NOTE — ED Triage Notes (Signed)
Patient here requesting refill of '500mg'$  Keppra and '75mg'$  clopidogrel prescription. Patient states he ran out one day ago. Has no other complaints.

## 2022-07-25 NOTE — ED Provider Notes (Signed)
[place] EMERGENCY DEPARTMENT Provider Note   CSN: [id number] Arrival date & time: 07/25/22  1027     History  Chief Complaint  Patient presents with   Medication Refill    Arthur Beasley is a 41 y.o. male with history of prior CVA, bipolar disorder, HTN.  Presenting to the ED requesting medication refill of Plavix and Keppra.  Seen previously on 02/18/2022 for the same and has come to the ED repeatedly over the course of 2 to 3 years for same request.  Has been recommended to establish with a PCP, however has yet to do so.  Patient states he is out of these medications.  Denies any medical complaints at this time.  The history is provided by the patient and medical records.  Medication Refill    Home Medications Prior to Admission medications   Medication Sig Start Date End Date Taking? Authorizing Provider  clopidogrel (PLAVIX) 75 MG tablet Take 1 tablet (75 mg total) by mouth daily. [birth year]/45   [name], PA-C  levETIRAcetam (KEPPRA) 500 MG tablet Take 1 tablet (500 mg total) by mouth 2 (two) times daily. 07/25/22 65/4/65  [name], PA-C  valsartan-hydrochlorothiazide (DIOVAN-HCT) 80-12.5 MG tablet Take 1 tablet by mouth daily. 05/17/22   [provider]      Allergies    Latex and Penicillins    Review of Systems   Review of Systems  All other systems reviewed and are negative.   Physical Exam Updated Vital Signs BP 100/65 (BP Location: Left Arm)   Pulse 100   Temp 98.4 F (36.9 C) (Oral)   Resp 14   SpO2 100%  Physical Exam Vitals and nursing note reviewed.  Constitutional:      General: He is not in acute distress.    Appearance: He is well-developed. He is not ill-appearing, toxic-appearing or diaphoretic.  HENT:     Head: Normocephalic and atraumatic.     Mouth/Throat:     Mouth: Mucous membranes are moist.     Pharynx: Oropharynx is clear.  Eyes:     Conjunctiva/sclera: Conjunctivae normal.  Cardiovascular:      Rate and Rhythm: Normal rate and regular rhythm.     Pulses: Normal pulses.     Heart sounds: No murmur heard. Pulmonary:     Effort: Pulmonary effort is normal. No respiratory distress.     Breath sounds: Normal breath sounds.  Chest:     Chest wall: No tenderness.  Abdominal:     Palpations: Abdomen is soft.     Tenderness: There is no abdominal tenderness.  Musculoskeletal:        General: No swelling.     Cervical back: Neck supple. No rigidity.  Skin:    General: Skin is warm and dry.     Capillary Refill: Capillary refill takes less than 2 seconds.  Neurological:     Mental Status: He is alert and oriented to person, place, and time.  Psychiatric:        Mood and Affect: Mood normal.     ED Results / Procedures / Treatments   Labs (all labs ordered are listed, but only abnormal results are displayed) Labs Reviewed - No data to display  EKG None  Radiology No results found.  Procedures Procedures    Medications Ordered in ED Medications - No data to display  ED Course/ Medical Decision Making/ A&P  Medical Decision Making Risk Prescription drug management.   Patient is a 41 year old male with Hx of prior CVA, bipolar disorder, HTN.  Requesting medication refills at the ED today, no other complaints at this time.  Specifically requesting Plavix and Keppra.  Seen previously on 02/18/2022 for same request.  Has been seen many times over the last 2 years for the same request.  Usually  recommended to follow-up with a PCP and establish care, with resources provided, however he has not yet done so.  States he is now out of these medications.  Physical exam unremarkable.  Vitals stable, hemodynamically stable.  Very reassuring exam.  Again without any complaints.  Refill for clopidogrel and Keppra provided.  Emphasized importance on establishing care with PCP.  Ambulatory referral for community care coordination placed requesting  assistance establishing patient with PCP.  Appears overall stable for discharge.  Strict return precautions discussed.  Patient in NAD and good condition at time of discharge.         Final Clinical Impression(s) / ED Diagnoses Final diagnoses:  Encounter for medication refill    Rx / DC Orders ED Discharge Orders          Ordered    clopidogrel (PLAVIX) 75 MG tablet  Daily        07/25/22 1153    levETIRAcetam (KEPPRA) 500 MG tablet  2 times daily        07/25/22 1153    AMB Referral to [facility] Coordinaton        07/25/22 1156              [name], Hershal Coria [birth year] 1930    Varney Biles, MD 07/26/22 [phone]

## 2022-07-25 NOTE — Discharge Instructions (Addendum)
A refill for your Keppra and Plavix has been sent to your pharmacy.  Please continue to take these as prescribed.  I also strongly encourage establishing care with a PCP.  Resources have been provided for you to do so.  Please call to schedule your first appointment as soon as possible.  Return to the ED for any new or worsening symptoms as discussed   If you do not have a doctor see the list below.  RESOURCE GUIDE  Chronic Pain Problems: Contact [facility]  [phone] Patients need to be referred by their primary care doctor.  Insufficient Money for Medicine: Contact [employer] Way:  call "211" or [place] [phone].  No Primary Care Doctor: Call [facility]  [phone] - can help you locate a primary care doctor that  accepts your insurance, provides certain services, etc. Physician Referral Service- [phone] Agencies that provide inexpensive medical care: [facility]  [facility]  [phone] [facility]  [phone] [facility]  [phone] Planned [facility]  [phone] [facility]  [phone]  [place] Providers: [facility]- [address], [address]  [phone], Mon-Fri 9am-7pm, Sat 9am-1pm [place], Suite [place], Suite [place]  [place]- [address]  [place], [address], [phone]  Only accepts [place] patients after they have their name  applied to their card  Self Pay (no insurance) in [facility]: Sickle Cell Patients: Dr Kevan Ny, [facility]  [place], [facility]- [place]  [place] [facility] [place]- [address], [facility]- see information above (Speak to [employer] if you do not have insurance)       -  [facility]- [place], [place],  [place], [place]  Dr Vista Lawman-  [address], [place], [place] [facility]- [address] [phone]       -  [facility]- [address], [place], also [address] [phone]       -    [facility]- [address], [place], 1st & 3rd Saturday   every month, 10am-1pm  1) Find a Doctor and Pay Out of Pocket Although you won't have to find out who is covered by your insurance plan, it is a good idea to ask around and get recommendations. You will then need to call the office and see if the doctor you have chosen will accept you as a new patient and what types of options they offer for patients who are self-pay. Some doctors offer discounts or will set up payment plans for their patients who do not have insurance, but you will need to ask so you aren't surprised when you get to your appointment.  2) Contact Your Local Health Department  Not all health departments have doctors that can see patients for sick visits, but many do, so it is worth a call to see if yours does. If you don't know where your local health department is, you can check in your phone book. The CDC also has a tool to help you locate your state's health department, and many state websites also have listings of all of their local health departments.  3) Find a [facility] If your illness is not likely to be very severe or complicated, you may want to try a walk in clinic. These are popping up all over the country in pharmacies, drugstores, and shopping centers. They're usually staffed by nurse practitioners or physician assistants that have been trained to treat common illnesses and complaints.  They're usually fairly quick and inexpensive. However, if you have serious medical issues or chronic medical problems, these are probably not your best option  [facility] [place], [facility], [address], [place], phone [phone] or [phone].  Monday - Friday, call for an appointment. [place], [facility], [address], [place], phone [phone] or [phone].  Monday - Friday, call for an appointment.  Abuse/Neglect: [place] [phone] [place] [phone] (After Hours)  Emergency Shelter:  [facility] [phone]  Maternity Homes: Room at the [place] [phone] [place] [phone]  MRSA Hotline #:   [phone]  [facility]. [address]      [place] Phone:  [phone]                                   Phone:  [phone]                 Phone:  [phone]  [facility], [place]- [phone]       -     [facility] in [place], [address]                                  [place] [phone] or [phone] (After Hours)  [place]  Substance Abuse Resources: Alcohol and Drug Services  [place] [phone] [facility] [place]a [place] [phone] Residential & Outpatient Substance Abuse Program  [phone]  Psychological Services: [place]  [phone] [facility] [place]  [phone] [facility], [place] [address], [place], [place]: [phone] or [phone], [email or web address]  Dental Assistance  Patients with Medicaid: [place] [phone] Phone:  [phone]                                                  Phone:  [phone]  If unable to pay or uninsured, contact:  [facility] or [facility]. to become qualified for the adult dental clinic.  Patients with Medicaid: [facility] [phone] [name], [place] [address], [phone]  If unable to pay, or uninsured, contact [facility] [phone]) or [place] [phone] in [place], [place] in [facility]) to become qualified for the adult dental clinic  Other [place]- [place], [place], [place], [address], [place], [place], 2nd and 4th Thursday of the month at 6:30am.  10 clients each day by appointment, can sometimes see walk-in patients if someone does not show for an appointment. [facility]- [address] [place], [place], [address], [place], [place], [place], [address], [place] Department- [phone] [facility] [phone]

## 2023-03-16 ENCOUNTER — Inpatient Hospital Stay (HOSPITAL_COMMUNITY): Payer: Medicaid Other

## 2023-03-16 ENCOUNTER — Encounter (HOSPITAL_COMMUNITY)
Admission: EM | Disposition: A | Discharge status: Skilled Nursing Facility | Payer: Self-pay | Source: Home / Self Care | Attending: Neurosurgery

## 2023-03-16 ENCOUNTER — Emergency Department (HOSPITAL_COMMUNITY): Payer: Medicaid Other | Admitting: Certified Registered Nurse Anesthetist

## 2023-03-16 ENCOUNTER — Encounter (HOSPITAL_COMMUNITY): Payer: Self-pay | Admitting: Emergency Medicine

## 2023-03-16 ENCOUNTER — Other Ambulatory Visit: Payer: Self-pay

## 2023-03-16 ENCOUNTER — Emergency Department (HOSPITAL_COMMUNITY): Payer: Medicaid Other

## 2023-03-16 ENCOUNTER — Inpatient Hospital Stay (HOSPITAL_COMMUNITY)
Admission: EM | Admit: 2023-03-16 | Discharge: 2023-04-20 | DRG: 025 | Disposition: A | Discharge status: Skilled Nursing Facility | Payer: Medicaid Other | Attending: Neurosurgery | Admitting: Neurosurgery

## 2023-03-16 DIAGNOSIS — I10 Essential (primary) hypertension: Secondary | ICD-10-CM | POA: Diagnosis present

## 2023-03-16 DIAGNOSIS — F17210 Nicotine dependence, cigarettes, uncomplicated: Secondary | ICD-10-CM | POA: Diagnosis present

## 2023-03-16 DIAGNOSIS — J95851 Ventilator associated pneumonia: Secondary | ICD-10-CM | POA: Diagnosis not present

## 2023-03-16 DIAGNOSIS — Z9889 Other specified postprocedural states: Secondary | ICD-10-CM

## 2023-03-16 DIAGNOSIS — G935 Compression of brain: Secondary | ICD-10-CM | POA: Diagnosis present

## 2023-03-16 DIAGNOSIS — F319 Bipolar disorder, unspecified: Secondary | ICD-10-CM

## 2023-03-16 DIAGNOSIS — Y848 Other medical procedures as the cause of abnormal reaction of the patient, or of later complication, without mention of misadventure at the time of the procedure: Secondary | ICD-10-CM | POA: Diagnosis not present

## 2023-03-16 DIAGNOSIS — J9601 Acute respiratory failure with hypoxia: Secondary | ICD-10-CM | POA: Diagnosis present

## 2023-03-16 DIAGNOSIS — E8721 Acute metabolic acidosis: Secondary | ICD-10-CM | POA: Diagnosis present

## 2023-03-16 DIAGNOSIS — G9349 Other encephalopathy: Secondary | ICD-10-CM | POA: Diagnosis present

## 2023-03-16 DIAGNOSIS — W19XXXA Unspecified fall, initial encounter: Secondary | ICD-10-CM | POA: Diagnosis present

## 2023-03-16 DIAGNOSIS — E876 Hypokalemia: Secondary | ICD-10-CM | POA: Diagnosis not present

## 2023-03-16 DIAGNOSIS — Z79899 Other long term (current) drug therapy: Secondary | ICD-10-CM

## 2023-03-16 DIAGNOSIS — D62 Acute posthemorrhagic anemia: Secondary | ICD-10-CM | POA: Diagnosis present

## 2023-03-16 DIAGNOSIS — F10139 Alcohol abuse with withdrawal, unspecified: Secondary | ICD-10-CM | POA: Diagnosis not present

## 2023-03-16 DIAGNOSIS — S065XAA Traumatic subdural hemorrhage with loss of consciousness status unknown, initial encounter: Principal | ICD-10-CM | POA: Diagnosis present

## 2023-03-16 DIAGNOSIS — Z880 Allergy status to penicillin: Secondary | ICD-10-CM

## 2023-03-16 DIAGNOSIS — Z823 Family history of stroke: Secondary | ICD-10-CM

## 2023-03-16 DIAGNOSIS — Y903 Blood alcohol level of 60-79 mg/100 ml: Secondary | ICD-10-CM | POA: Diagnosis present

## 2023-03-16 DIAGNOSIS — S061XAA Traumatic cerebral edema with loss of consciousness status unknown, initial encounter: Secondary | ICD-10-CM | POA: Diagnosis present

## 2023-03-16 DIAGNOSIS — Z833 Family history of diabetes mellitus: Secondary | ICD-10-CM | POA: Diagnosis not present

## 2023-03-16 DIAGNOSIS — J690 Pneumonitis due to inhalation of food and vomit: Secondary | ICD-10-CM | POA: Diagnosis present

## 2023-03-16 DIAGNOSIS — G8111 Spastic hemiplegia affecting right dominant side: Secondary | ICD-10-CM | POA: Diagnosis present

## 2023-03-16 DIAGNOSIS — Z781 Physical restraint status: Secondary | ICD-10-CM | POA: Diagnosis not present

## 2023-03-16 DIAGNOSIS — Z7902 Long term (current) use of antithrombotics/antiplatelets: Secondary | ICD-10-CM

## 2023-03-16 DIAGNOSIS — Z751 Person awaiting admission to adequate facility elsewhere: Secondary | ICD-10-CM | POA: Diagnosis not present

## 2023-03-16 DIAGNOSIS — E870 Hyperosmolality and hypernatremia: Secondary | ICD-10-CM | POA: Diagnosis present

## 2023-03-16 DIAGNOSIS — Z91040 Latex allergy status: Secondary | ICD-10-CM

## 2023-03-16 DIAGNOSIS — G40909 Epilepsy, unspecified, not intractable, without status epilepticus: Secondary | ICD-10-CM | POA: Diagnosis present

## 2023-03-16 DIAGNOSIS — R1312 Dysphagia, oropharyngeal phase: Secondary | ICD-10-CM | POA: Diagnosis not present

## 2023-03-16 DIAGNOSIS — R569 Unspecified convulsions: Secondary | ICD-10-CM | POA: Diagnosis not present

## 2023-03-16 HISTORY — PX: CRANIOTOMY: SHX93

## 2023-03-16 LAB — I-STAT CHEM 8, ED
BUN: 5 mg/dL — ABNORMAL LOW (ref 6–20)
Calcium, Ion: 1.07 mmol/L — ABNORMAL LOW (ref 1.15–1.40)
Chloride: 100 mmol/L (ref 98–111)
Creatinine, Ser: 0.7 mg/dL (ref 0.61–1.24)
Glucose, Bld: 134 mg/dL — ABNORMAL HIGH (ref 70–99)
HCT: 42 % (ref 39.0–52.0)
Hemoglobin: 14.3 g/dL (ref 13.0–17.0)
Potassium: 4.3 mmol/L (ref 3.5–5.1)
Sodium: 137 mmol/L (ref 135–145)
TCO2: 25 mmol/L (ref 22–32)

## 2023-03-16 LAB — CBC WITH DIFFERENTIAL/PLATELET
Abs Immature Granulocytes: 0.03 10*3/uL (ref 0.00–0.07)
Basophils Absolute: 0 10*3/uL (ref 0.0–0.1)
Basophils Relative: 0 %
Eosinophils Absolute: 0 10*3/uL (ref 0.0–0.5)
Eosinophils Relative: 0 %
HCT: 41 % (ref 39.0–52.0)
Hemoglobin: 13.9 g/dL (ref 13.0–17.0)
Immature Granulocytes: 0 %
Lymphocytes Relative: 4 %
Lymphs Abs: 0.4 10*3/uL — ABNORMAL LOW (ref 0.7–4.0)
MCH: 31.2 pg (ref 26.0–34.0)
MCHC: 33.9 g/dL (ref 30.0–36.0)
MCV: 91.9 fL (ref 80.0–100.0)
Monocytes Absolute: 0.7 10*3/uL (ref 0.1–1.0)
Monocytes Relative: 8 %
Neutro Abs: 7.9 10*3/uL — ABNORMAL HIGH (ref 1.7–7.7)
Neutrophils Relative %: 88 %
Platelets: 227 10*3/uL (ref 150–400)
RBC: 4.46 MIL/uL (ref 4.22–5.81)
RDW: 13.5 % (ref 11.5–15.5)
WBC: 9.1 10*3/uL (ref 4.0–10.5)
nRBC: 0 % (ref 0.0–0.2)

## 2023-03-16 LAB — CBC
HCT: 32.6 % — ABNORMAL LOW (ref 39.0–52.0)
Hemoglobin: 11.4 g/dL — ABNORMAL LOW (ref 13.0–17.0)
MCH: 31.4 pg (ref 26.0–34.0)
MCHC: 35 g/dL (ref 30.0–36.0)
MCV: 89.8 fL (ref 80.0–100.0)
Platelets: 167 10*3/uL (ref 150–400)
RBC: 3.63 MIL/uL — ABNORMAL LOW (ref 4.22–5.81)
RDW: 13.6 % (ref 11.5–15.5)
WBC: 6.9 10*3/uL (ref 4.0–10.5)
nRBC: 0 % (ref 0.0–0.2)

## 2023-03-16 LAB — BASIC METABOLIC PANEL
Anion gap: 12 (ref 5–15)
BUN: 8 mg/dL (ref 6–20)
CO2: 23 mmol/L (ref 22–32)
Calcium: 7.3 mg/dL — ABNORMAL LOW (ref 8.9–10.3)
Chloride: 108 mmol/L (ref 98–111)
Creatinine, Ser: 0.78 mg/dL (ref 0.61–1.24)
GFR, Estimated: >60 mL/min (ref >60)
Glucose, Bld: 94 mg/dL (ref 70–99)
Potassium: 3.7 mmol/L (ref 3.5–5.1)
Sodium: 143 mmol/L (ref 135–145)

## 2023-03-16 LAB — I-STAT VENOUS BLOOD GAS, ED
Acid-base deficit: 4 mmol/L — ABNORMAL HIGH (ref 0.0–2.0)
Bicarbonate: 23 mmol/L (ref 20.0–28.0)
Calcium, Ion: 1.08 mmol/L — ABNORMAL LOW (ref 1.15–1.40)
HCT: 43 % (ref 39.0–52.0)
Hemoglobin: 14.6 g/dL (ref 13.0–17.0)
O2 Saturation: 99 %
Potassium: 4.3 mmol/L (ref 3.5–5.1)
Sodium: 136 mmol/L (ref 135–145)
TCO2: 24 mmol/L (ref 22–32)
pCO2, Ven: 46.2 mmHg (ref 44–60)
pH, Ven: 7.304 (ref 7.25–7.43)
pO2, Ven: 142 mmHg — ABNORMAL HIGH (ref 32–45)

## 2023-03-16 LAB — LACTIC ACID, PLASMA
Lactic Acid, Venous: 5.4 mmol/L (ref 0.5–1.9)
Lactic Acid, Venous: 2 mmol/L (ref 0.5–1.9)

## 2023-03-16 LAB — MRSA NEXT GEN BY PCR, NASAL: MRSA by PCR Next Gen: NOT DETECTED

## 2023-03-16 LAB — COMPREHENSIVE METABOLIC PANEL
ALT: 68 U/L — ABNORMAL HIGH (ref 0–44)
AST: 56 U/L — ABNORMAL HIGH (ref 15–41)
Albumin: 3.8 g/dL (ref 3.5–5.0)
Alkaline Phosphatase: 82 U/L (ref 38–126)
Anion gap: 16 — ABNORMAL HIGH (ref 5–15)
BUN: 6 mg/dL (ref 6–20)
CO2: 20 mmol/L — ABNORMAL LOW (ref 22–32)
Calcium: 8.4 mg/dL — ABNORMAL LOW (ref 8.9–10.3)
Chloride: 98 mmol/L (ref 98–111)
Creatinine, Ser: 0.8 mg/dL (ref 0.61–1.24)
GFR, Estimated: >60 mL/min (ref >60)
Glucose, Bld: 138 mg/dL — ABNORMAL HIGH (ref 70–99)
Potassium: 4 mmol/L (ref 3.5–5.1)
Sodium: 134 mmol/L — ABNORMAL LOW (ref 135–145)
Total Bilirubin: 0.6 mg/dL (ref 0.3–1.2)
Total Protein: 7.3 g/dL (ref 6.5–8.1)

## 2023-03-16 LAB — ETHANOL: Alcohol, Ethyl (B): 64 mg/dL — ABNORMAL HIGH (ref <10)

## 2023-03-16 LAB — SODIUM: Sodium: 144 mmol/L (ref 135–145)

## 2023-03-16 SURGERY — CRANIOTOMY HEMATOMA EVACUATION SUBDURAL
Anesthesia: General | Laterality: Right

## 2023-03-16 MED ORDER — HEMOSTATIC AGENTS (NO CHARGE) OPTIME
TOPICAL | Status: DC | PRN
  Administered 2023-03-16: 1 via TOPICAL

## 2023-03-16 MED ORDER — ONDANSETRON HCL 4 MG PO TABS: 4.0000 mg | ORAL_TABLET | ORAL | Status: DC | PRN

## 2023-03-16 MED ORDER — THROMBIN 20000 UNITS EX SOLR
CUTANEOUS | Status: DC | PRN
  Administered 2023-03-16: 20 mL via TOPICAL

## 2023-03-16 MED ORDER — LEVETIRACETAM IN NACL 1000 MG/100ML IV SOLN
1000.0000 mg | Freq: Two times a day (BID) | INTRAVENOUS | Status: DC
  Administered 2023-03-16 – 2023-03-29 (×27): 1000 mg via INTRAVENOUS
  Filled 2023-03-16 (×28): qty 100

## 2023-03-16 MED ORDER — ETOMIDATE 2 MG/ML IV SOLN
INTRAVENOUS | Status: DC | PRN
  Administered 2023-03-16: 20 mg via INTRAVENOUS

## 2023-03-16 MED ORDER — ROCURONIUM BROMIDE 10 MG/ML (PF) SYRINGE
PREFILLED_SYRINGE | INTRAVENOUS | Status: DC | PRN
  Administered 2023-03-16: 30 mg via INTRAVENOUS
  Administered 2023-03-16: 50 mg via INTRAVENOUS

## 2023-03-16 MED ORDER — FENTANYL CITRATE PF 50 MCG/ML IJ SOSY
PREFILLED_SYRINGE | INTRAMUSCULAR | Status: AC
  Administered 2023-03-16: 50 ug via INTRAVENOUS
  Filled 2023-03-16: qty 2

## 2023-03-16 MED ORDER — PANTOPRAZOLE SODIUM 40 MG IV SOLR
40.0000 mg | Freq: Every day | INTRAVENOUS | Status: DC
  Administered 2023-03-16 – 2023-03-18 (×3): 40 mg via INTRAVENOUS
  Filled 2023-03-16 (×3): qty 10

## 2023-03-16 MED ORDER — ACETAMINOPHEN 325 MG PO TABS
650.0000 mg | ORAL_TABLET | ORAL | Status: DC | PRN
  Administered 2023-03-17 – 2023-04-12 (×8): 650 mg
  Filled 2023-03-16 (×8): qty 2

## 2023-03-16 MED ORDER — CHLORHEXIDINE GLUCONATE CLOTH 2 % EX PADS
6.0000 | MEDICATED_PAD | Freq: Every day | CUTANEOUS | Status: DC
  Administered 2023-03-16 – 2023-04-02 (×18): 6 via TOPICAL

## 2023-03-16 MED ORDER — CLEVIDIPINE BUTYRATE 0.5 MG/ML IV EMUL
INTRAVENOUS | Status: AC
  Filled 2023-03-16: qty 100

## 2023-03-16 MED ORDER — AMMONIA AROMATIC IN INHA
1.0000 | Freq: Once | RESPIRATORY_TRACT | Status: DC
Start: 2023-03-16 — End: 2023-03-17
  Filled 2023-03-16: qty 10

## 2023-03-16 MED ORDER — PROMETHAZINE HCL 25 MG PO TABS: 12.5000 mg | ORAL_TABLET | ORAL | Status: DC | PRN

## 2023-03-16 MED ORDER — NALOXONE HCL 2 MG/2ML IJ SOSY
PREFILLED_SYRINGE | INTRAMUSCULAR | Status: AC
  Filled 2023-03-16: qty 2

## 2023-03-16 MED ORDER — ORAL CARE MOUTH RINSE
15.0000 mL | OROMUCOSAL | Status: DC
  Administered 2023-03-16 – 2023-03-18 (×24): 15 mL via OROMUCOSAL

## 2023-03-16 MED ORDER — SODIUM CHLORIDE 3 % IV SOLN
INTRAVENOUS | Status: DC
  Filled 2023-03-16 (×5): qty 500

## 2023-03-16 MED ORDER — ROCURONIUM BROMIDE 10 MG/ML (PF) SYRINGE
PREFILLED_SYRINGE | INTRAVENOUS | Status: AC
  Filled 2023-03-16: qty 10

## 2023-03-16 MED ORDER — ACETAMINOPHEN 650 MG RE SUPP: 650.0000 mg | RECTAL | Status: DC | PRN

## 2023-03-16 MED ORDER — ORAL CARE MOUTH RINSE: 15.0000 mL | OROMUCOSAL | Status: DC | PRN

## 2023-03-16 MED ORDER — LEVETIRACETAM IN NACL 1500 MG/100ML IV SOLN
1500.0000 mg | Freq: Once | INTRAVENOUS | Status: AC
  Administered 2023-03-16: 1500 mg via INTRAVENOUS
  Filled 2023-03-16: qty 100

## 2023-03-16 MED ORDER — LIDOCAINE-EPINEPHRINE 1 %-1:100000 IJ SOLN
INTRAMUSCULAR | Status: DC | PRN
  Administered 2023-03-16: 10 mL

## 2023-03-16 MED ORDER — FENTANYL 2500MCG IN NS 250ML (10MCG/ML) PREMIX INFUSION
50.0000 ug/h | INTRAVENOUS | Status: DC
  Administered 2023-03-16: 50 ug/h via INTRAVENOUS
  Administered 2023-03-17: 125 ug/h via INTRAVENOUS
  Filled 2023-03-16 (×2): qty 250

## 2023-03-16 MED ORDER — PROPOFOL 10 MG/ML IV BOLUS
INTRAVENOUS | Status: AC
  Filled 2023-03-16: qty 20

## 2023-03-16 MED ORDER — FENTANYL CITRATE (PF) 250 MCG/5ML IJ SOLN
INTRAMUSCULAR | Status: AC
  Filled 2023-03-16: qty 5

## 2023-03-16 MED ORDER — HYDROMORPHONE HCL 1 MG/ML IJ SOLN
0.5000 mg | INTRAMUSCULAR | Status: DC | PRN
  Administered 2023-03-17 – 2023-04-09 (×13): 1 mg via INTRAVENOUS
  Filled 2023-03-16 (×14): qty 1

## 2023-03-16 MED ORDER — THROMBIN 5000 UNITS EX SOLR
CUTANEOUS | Status: AC
  Filled 2023-03-16: qty 5000

## 2023-03-16 MED ORDER — FENTANYL CITRATE (PF) 250 MCG/5ML IJ SOLN
INTRAMUSCULAR | Status: DC | PRN
  Administered 2023-03-16: 100 ug via INTRAVENOUS
  Administered 2023-03-16: 50 ug via INTRAVENOUS

## 2023-03-16 MED ORDER — ONDANSETRON HCL 4 MG/2ML IJ SOLN: 4.0000 mg | INTRAMUSCULAR | Status: DC | PRN

## 2023-03-16 MED ORDER — FENTANYL CITRATE PF 50 MCG/ML IJ SOSY
50.0000 ug | PREFILLED_SYRINGE | Freq: Once | INTRAMUSCULAR | Status: AC
  Filled 2023-03-16: qty 1

## 2023-03-16 MED ORDER — SUCCINYLCHOLINE CHLORIDE 20 MG/ML IJ SOLN
INTRAMUSCULAR | Status: DC | PRN
  Administered 2023-03-16: 100 mg via INTRAVENOUS

## 2023-03-16 MED ORDER — THROMBIN 20000 UNITS EX SOLR
CUTANEOUS | Status: AC
  Filled 2023-03-16: qty 20000

## 2023-03-16 MED ORDER — MIDAZOLAM HCL 2 MG/2ML IJ SOLN
INTRAMUSCULAR | Status: AC
  Filled 2023-03-16: qty 2

## 2023-03-16 MED ORDER — LIDOCAINE-EPINEPHRINE 1 %-1:100000 IJ SOLN
INTRAMUSCULAR | Status: AC
  Filled 2023-03-16: qty 1

## 2023-03-16 MED ORDER — LORAZEPAM 2 MG/ML IJ SOLN
INTRAMUSCULAR | Status: AC
  Administered 2023-03-16: 2 mg
  Filled 2023-03-16: qty 1

## 2023-03-16 MED ORDER — SODIUM CHLORIDE 0.9 % IV SOLN
2.0000 g | Freq: Once | INTRAVENOUS | Status: AC
  Administered 2023-03-16: 2 g via INTRAVENOUS
  Filled 2023-03-16: qty 20

## 2023-03-16 MED ORDER — PROPOFOL 1000 MG/100ML IV EMUL
0.0000 ug/kg/min | INTRAVENOUS | Status: DC
  Administered 2023-03-16: 150 ug/kg/min via INTRAVENOUS
  Administered 2023-03-16: 30 ug/kg/min via INTRAVENOUS
  Administered 2023-03-16: 5 ug/kg/min via INTRAVENOUS
  Administered 2023-03-17: 30 ug/kg/min via INTRAVENOUS
  Administered 2023-03-17: 20 ug/kg/min via INTRAVENOUS
  Filled 2023-03-16 (×5): qty 100

## 2023-03-16 MED ORDER — FENTANYL CITRATE PF 50 MCG/ML IJ SOSY
PREFILLED_SYRINGE | INTRAMUSCULAR | Status: DC | PRN
  Administered 2023-03-16: 50 ug via INTRAVENOUS

## 2023-03-16 MED ORDER — ETOMIDATE 2 MG/ML IV SOLN
INTRAVENOUS | Status: DC
Start: 2023-03-16 — End: 2023-03-16
  Filled 2023-03-16: qty 20

## 2023-03-16 MED ORDER — KETAMINE HCL 50 MG/5ML IJ SOSY
PREFILLED_SYRINGE | INTRAMUSCULAR | Status: AC
  Filled 2023-03-16: qty 10

## 2023-03-16 MED ORDER — SODIUM CHLORIDE 3 % IV BOLUS
250.0000 mL | Freq: Once | INTRAVENOUS | Status: AC
  Administered 2023-03-16: 250 mL via INTRAVENOUS
  Filled 2023-03-16: qty 500

## 2023-03-16 MED ORDER — SODIUM CHLORIDE 0.9 % IV SOLN
INTRAVENOUS | Status: AC | PRN
  Administered 2023-03-16: 1000 mL via INTRAVENOUS

## 2023-03-16 MED ORDER — LABETALOL HCL 5 MG/ML IV SOLN: 20.0000 mg | Freq: Once | INTRAVENOUS | Status: DC

## 2023-03-16 MED ORDER — CEFAZOLIN SODIUM-DEXTROSE 2-4 GM/100ML-% IV SOLN
2.0000 g | Freq: Three times a day (TID) | INTRAVENOUS | Status: AC
  Administered 2023-03-16 – 2023-03-17 (×2): 2 g via INTRAVENOUS
  Filled 2023-03-16 (×2): qty 100

## 2023-03-16 MED ORDER — ACETAMINOPHEN 325 MG PO TABS
650.0000 mg | ORAL_TABLET | ORAL | Status: DC | PRN
  Administered 2023-03-16: 650 mg via ORAL
  Filled 2023-03-16: qty 2

## 2023-03-16 MED ORDER — FENTANYL BOLUS VIA INFUSION: 50.0000 ug | INTRAVENOUS | Status: DC | PRN

## 2023-03-16 MED ORDER — 0.9 % SODIUM CHLORIDE (POUR BTL) OPTIME
TOPICAL | Status: DC | PRN
  Administered 2023-03-16: 1000 mL
  Administered 2023-03-16: 2000 mL

## 2023-03-16 MED ORDER — NALOXONE HCL 0.4 MG/ML IJ SOLN
INTRAMUSCULAR | Status: DC | PRN
  Administered 2023-03-16: 2 mg via INTRAVENOUS

## 2023-03-16 MED ORDER — LABETALOL HCL 5 MG/ML IV SOLN
10.0000 mg | INTRAVENOUS | Status: DC | PRN
  Administered 2023-03-17 – 2023-03-19 (×4): 20 mg via INTRAVENOUS
  Administered 2023-03-19: 40 mg via INTRAVENOUS
  Administered 2023-03-19: 20 mg via INTRAVENOUS
  Administered 2023-03-19: 40 mg via INTRAVENOUS
  Administered 2023-03-19: 20 mg via INTRAVENOUS
  Administered 2023-03-19: 40 mg via INTRAVENOUS
  Administered 2023-03-20: 20 mg via INTRAVENOUS
  Filled 2023-03-16 (×2): qty 4
  Filled 2023-03-16: qty 8
  Filled 2023-03-16 (×2): qty 4
  Filled 2023-03-16: qty 8
  Filled 2023-03-16 (×2): qty 4
  Filled 2023-03-16: qty 8
  Filled 2023-03-16 (×2): qty 4

## 2023-03-16 MED ORDER — SODIUM CHLORIDE 0.9 % IV SOLN
500.0000 mg | Freq: Once | INTRAVENOUS | Status: DC
  Filled 2023-03-16: qty 5

## 2023-03-16 MED ORDER — SUCCINYLCHOLINE CHLORIDE 200 MG/10ML IV SOSY
PREFILLED_SYRINGE | INTRAVENOUS | Status: AC
  Filled 2023-03-16: qty 10

## 2023-03-16 MED ORDER — SODIUM CHLORIDE 0.9 % IV SOLN: INTRAVENOUS | Status: DC

## 2023-03-16 SURGICAL SUPPLY — 68 items
ADH SKN CLS APL DERMABOND .7 (GAUZE/BANDAGES/DRESSINGS)
BAG COUNTER SPONGE SURGICOUNT (BAG) ×2 IMPLANT
BAG DECANTER FOR FLEXI CONT (MISCELLANEOUS) ×2 IMPLANT
BAG SPNG CNTER NS LX DISP (BAG) ×2
BIT DRILL WIRE PASS 1.3MM (BIT) ×2 IMPLANT
BLADE SURG 11 STRL SS (BLADE) IMPLANT
BNDG CMPR 75X41 PLY HI ABS (GAUZE/BANDAGES/DRESSINGS)
BNDG COHESIVE 4X5 TAN STRL (GAUZE/BANDAGES/DRESSINGS) IMPLANT
BNDG STRETCH 4X75 STRL LF (GAUZE/BANDAGES/DRESSINGS) IMPLANT
BUR ACORN 6.0 PRECISION (BURR) ×2 IMPLANT
BUR SPIRAL ROUTER 2.3 (BUR) IMPLANT
CANISTER SUCT 3000ML PPV (MISCELLANEOUS) ×2 IMPLANT
CLIP TI MEDIUM 6 (CLIP) IMPLANT
COVER BACK TABLE 60X90IN (DRAPES) ×4 IMPLANT
DERMABOND ADVANCED .7 DNX12 (GAUZE/BANDAGES/DRESSINGS) IMPLANT
DRAIN CHANNEL 10M FLAT 3/4 FLT (DRAIN) IMPLANT
DRAPE MICROSCOPE SLANT 54X150 (MISCELLANEOUS) IMPLANT
DRAPE NEUROLOGICAL W/INCISE (DRAPES) ×2 IMPLANT
DRAPE SURG 17X23 STRL (DRAPES) IMPLANT
DRAPE WARM FLUID 44X44 (DRAPES) ×2 IMPLANT
DRILL WIRE PASS 1.3MM (BIT)
ELECT REM PT RETURN 9FT ADLT (ELECTROSURGICAL) ×1
ELECTRODE REM PT RTRN 9FT ADLT (ELECTROSURGICAL) ×2 IMPLANT
EVACUATOR SILICONE 100CC (DRAIN) IMPLANT
GAUZE 4X4 16PLY ~~LOC~~+RFID DBL (SPONGE) IMPLANT
GAUZE SPONGE 4X4 12PLY STRL (GAUZE/BANDAGES/DRESSINGS) ×2 IMPLANT
GLOVE BIO SURGEON STRL SZ 6.5 (GLOVE) ×2 IMPLANT
GLOVE BIOGEL PI IND STRL 6.5 (GLOVE) ×2 IMPLANT
GLOVE ECLIPSE 9.0 STRL (GLOVE) ×2 IMPLANT
GLOVE EXAM NITRILE XL STR (GLOVE) IMPLANT
GOWN STRL REUS W/ TWL LRG LVL3 (GOWN DISPOSABLE) IMPLANT
GOWN STRL REUS W/ TWL XL LVL3 (GOWN DISPOSABLE) IMPLANT
GOWN STRL REUS W/TWL 2XL LVL3 (GOWN DISPOSABLE) IMPLANT
GOWN STRL REUS W/TWL LRG LVL3 (GOWN DISPOSABLE) ×1
GOWN STRL REUS W/TWL XL LVL3 (GOWN DISPOSABLE) ×2
HEMOSTAT SURGICEL 2X14 (HEMOSTASIS) IMPLANT
KIT BASIN OR (CUSTOM PROCEDURE TRAY) ×2 IMPLANT
KIT TURNOVER KIT B (KITS) ×2 IMPLANT
NDL HYPO 25X1 1.5 SAFETY (NEEDLE) ×2 IMPLANT
NEEDLE HYPO 25X1 1.5 SAFETY (NEEDLE) ×1 IMPLANT
NS IRRIG 1000ML POUR BTL (IV SOLUTION) ×2 IMPLANT
PACK CRANIOTOMY CUSTOM (CUSTOM PROCEDURE TRAY) ×2 IMPLANT
PAD ARMBOARD 7.5X6 YLW CONV (MISCELLANEOUS) ×2 IMPLANT
PATTIES SURGICAL .25X.25 (GAUZE/BANDAGES/DRESSINGS) IMPLANT
PATTIES SURGICAL .5 X.5 (GAUZE/BANDAGES/DRESSINGS) IMPLANT
PATTIES SURGICAL .5 X3 (DISPOSABLE) IMPLANT
PATTIES SURGICAL 1X1 (DISPOSABLE) IMPLANT
PIN MAYFIELD SKULL DISP (PIN) IMPLANT
PLATE BONE 12 2H TARGET XL (Plate) IMPLANT
SCREW UNIII AXS SD 1.5X4 (Screw) IMPLANT
SOL ELECTROSURG ANTI STICK (MISCELLANEOUS)
SOLUTION ELECTROSURG ANTI STCK (MISCELLANEOUS) ×2 IMPLANT
SPONGE NEURO XRAY DETECT 1X3 (DISPOSABLE) IMPLANT
SPONGE SURGIFOAM ABS GEL 100 (HEMOSTASIS) ×2 IMPLANT
SPONGE T-LAP 18X18 ~~LOC~~+RFID (SPONGE) IMPLANT
STAPLER VISISTAT 35W (STAPLE) ×2 IMPLANT
STOCKINETTE 6  STRL (DRAPES) ×1
STOCKINETTE 6 STRL (DRAPES) IMPLANT
STOCKINETTE TUBULAR 6 INCH (GAUZE/BANDAGES/DRESSINGS) IMPLANT
SUT NURALON 4 0 TR CR/8 (SUTURE) ×4 IMPLANT
SUT VIC AB 2-0 CT1 18 (SUTURE) IMPLANT
SUT VIC AB 2-0 CT2 18 VCP726D (SUTURE) ×4 IMPLANT
TOWEL GREEN STERILE (TOWEL DISPOSABLE) ×2 IMPLANT
TOWEL GREEN STERILE FF (TOWEL DISPOSABLE) ×2 IMPLANT
TRAY FOLEY MTR SLVR 16FR STAT (SET/KITS/TRAYS/PACK) IMPLANT
TUBING FEATHERFLOW (TUBING) IMPLANT
UNDERPAD 30X36 HEAVY ABSORB (UNDERPADS AND DIAPERS) IMPLANT
WATER STERILE IRR 1000ML POUR (IV SOLUTION) ×2 IMPLANT

## 2023-03-16 NOTE — ED Notes (Signed)
Patient returned from CT with EDP and RN. RT at bedside and preparing to intubate patient.
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
## 2023-03-16 NOTE — Progress Notes (Signed)
LTM EEG running - no initial skin breakdown - push button tested - neuro notified. Leads displaced due to drain, incision and bandages on right side of head.   F7/8 - displaced 1cm down T7/8 - displaced 3cm down, 2cm back P7/8 - displaced 1cm down P3/4 - displaced 3cm down C3/4 - displaced 5cm up P3/4 - displaced 5cm up and 2cm forward

## 2023-03-16 NOTE — Transfer of Care (Signed)
Immediate Anesthesia Transfer of Care Note  Patient: Arthur Beasley  Procedure(s) Performed: CRANIOTOMY HEMATOMA EVACUATION SUBDURAL (Right)  Patient Location: ICU  Anesthesia Type:General  Level of Consciousness: sedated and Patient remains intubated per anesthesia plan  Airway & Oxygen Therapy: Patient remains intubated per anesthesia plan and Patient placed on Ventilator (see vital sign flow sheet for setting)  Post-op Assessment: Report given to RN and Post -op Vital signs reviewed and stable  Post vital signs: Reviewed and stable  Last Vitals:  Vitals Value Taken Time  BP 151/100 03/16/23 1243  Temp    Pulse 110 03/16/23 1248  Resp 16 03/16/23 1248  SpO2 100 % 03/16/23 1248  Vitals shown include unvalidated device data.  Last Pain: There were no vitals filed for this visit.       Complications: No notable events documented.

## 2023-03-16 NOTE — Anesthesia Procedure Notes (Signed)
Arterial Line Insertion Start/End4/29/2024 10:55 AM, 03/16/2023 10:59 AM Performed by: CRNA  Patient location: OR. Preanesthetic checklist: patient identified, IV checked, site marked, risks and benefits discussed, surgical consent, monitors and equipment checked, pre-op evaluation, timeout performed and anesthesia consent Patient sedated Left, radial was placed Catheter size: 20 G Maximum sterile barriers used   Attempts: 1 Procedure performed without using ultrasound guided technique. Following insertion, dressing applied. Post procedure assessment: normal  Patient tolerated the procedure well with no immediate complications. Additional procedure comments: Per Drema Balzarine, SRNA.

## 2023-03-16 NOTE — Progress Notes (Signed)
Rubber seal pushed through the bottle when this RN attempted to puncture it with IV tubing. Bottle of propofol wasted d/t product design.

## 2023-03-16 NOTE — Anesthesia Postprocedure Evaluation (Signed)
Anesthesia Post Note  Patient: Arthur Beasley  Procedure(s) Performed: CRANIOTOMY HEMATOMA EVACUATION SUBDURAL (Right)     Patient location during evaluation: ICU Anesthesia Type: General Level of consciousness: sedated Pain management: pain level controlled Vital Signs Assessment: post-procedure vital signs reviewed and stable Respiratory status: respiratory function stable and patient remains intubated per anesthesia plan Cardiovascular status: stable Postop Assessment: no apparent nausea or vomiting Anesthetic complications: no   No notable events documented.  Last Vitals:  Vitals:   03/16/23 1017 03/16/23 1245  BP:    Pulse:    Resp:    Temp:    SpO2: 100% 100%    Last Pain: There were no vitals filed for this visit.               Xavi Tomasik

## 2023-03-16 NOTE — Progress Notes (Signed)
RT received Pt from OR and placed Pt on the ventilator.

## 2023-03-16 NOTE — Progress Notes (Signed)
Latex catheter was noted on admission to 4N32. Foley was placed in OR, Neurosurgeon was notified.   Orders for removal of latex foley and reinsertion of a non-latex foley were given.   03/16/2023 Oralia Manis, RN 6:40 PM

## 2023-03-16 NOTE — ED Notes (Addendum)
Per pt record: Marcelene Butte is sister [phone] This RN attempted to call and text this number, with no response.

## 2023-03-16 NOTE — Anesthesia Procedure Notes (Signed)
Central Venous Catheter Insertion Performed by: Val Eagle, MD, anesthesiologist Start/End4/29/2024 12:01 PM, 03/16/2023 12:15 PM Patient location: OR. Emergency situation Preanesthetic checklist: patient identified, IV checked, site marked, monitors and equipment checked and timeout performed Position: supine Patient sedated Hand hygiene performed  and maximum sterile barriers used  Catheter size: 8 Fr Total catheter length 16. Sheath introducer Procedure performed using ultrasound guided technique. Ultrasound Notes:anatomy identified, needle tip was noted to be adjacent to the nerve/plexus identified, no ultrasound evidence of intravascular and/or intraneural injection and image(s) printed for medical record Attempts: 1 Following insertion, line sutured, dressing applied and Biopatch. Post procedure assessment: blood return through all ports, free fluid flow and no air  Patient tolerated the procedure well with no immediate complications.

## 2023-03-16 NOTE — Anesthesia Preprocedure Evaluation (Signed)
Anesthesia Evaluation  Patient identified by MRN, date of birth, ID band Patient unresponsive    Reviewed: Allergy & Precautions, Patient's Chart, lab work & pertinent test results  History of Anesthesia Complications Negative for: history of anesthetic complications  Airway Mallampati: Intubated       Dental   Pulmonary Current Smoker   breath sounds clear to auscultation       Cardiovascular hypertension,  Rhythm:Regular     Neuro/Psych  PSYCHIATRIC DISORDERS  Depression Bipolar Disorder   CVA    GI/Hepatic   Endo/Other    Renal/GU      Musculoskeletal   Abdominal   Peds  Hematology Lab Results      Component                Value               Date                      WBC                      9.1                 03/16/2023                HGB                      14.3                03/16/2023                HCT                      42.0                03/16/2023                MCV                      91.9                03/16/2023                PLT                      227                 03/16/2023              Anesthesia Other Findings   Reproductive/Obstetrics                             Anesthesia Physical Anesthesia Plan  ASA: 5 and emergent  Anesthesia Plan: General   Post-op Pain Management:    Induction: Intravenous  PONV Risk Score and Plan: 1 and Treatment may vary due to age or medical condition  Airway Management Planned: Oral ETT  Additional Equipment: Arterial line, CVP and Ultrasound Guidance Line Placement  Intra-op Plan:   Post-operative Plan: Post-operative intubation/ventilation  Informed Consent:      History available from chart only and Only emergency history available  Plan Discussed with: Surgeon and CRNA  Anesthesia Plan Comments:        Anesthesia Quick Evaluation
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
## 2023-03-16 NOTE — Brief Op Note (Signed)
03/16/2023  11:59 AM  PATIENT:  Arthur Beasley  42 y.o. male  PRE-OPERATIVE DIAGNOSIS:  SUBDURAL HEMATOMA  POST-OPERATIVE DIAGNOSIS:  * No post-op diagnosis entered *  PROCEDURE:  Procedure(s): CRANIOTOMY HEMATOMA EVACUATION SUBDURAL (Right)  SURGEON:  Surgeon(s) and Role:    * Julio Sicks, MD - Primary  PHYSICIAN ASSISTANT:   ASSISTANTS:    ANESTHESIA:   general  EBL:  50 mL   BLOOD ADMINISTERED:none  DRAINS: (10mm) Blake drain(s) in the subdural space    LOCAL MEDICATIONS USED:  NONE  SPECIMEN:  No Specimen  DISPOSITION OF SPECIMEN:  N/A  COUNTS:  YES  TOURNIQUET:  * No tourniquets in log *  DICTATION: .Dragon Dictation  PLAN OF CARE: Admit to inpatient   PATIENT DISPOSITION:  ICU - intubated and critically ill.   Delay start of Pharmacological VTE agent (>24hrs) due to surgical blood loss or risk of bleeding: yes
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
## 2023-03-16 NOTE — Progress Notes (Signed)
Patient was noted to have a latex catheter.  D/t latex allergy foley was changed to a non latex urethral catheter by Jean Rosenthal and Weston Brass Rns.

## 2023-03-17 ENCOUNTER — Inpatient Hospital Stay (HOSPITAL_COMMUNITY): Payer: Medicaid Other

## 2023-03-17 ENCOUNTER — Encounter (HOSPITAL_COMMUNITY): Payer: Self-pay | Admitting: Neurosurgery

## 2023-03-17 DIAGNOSIS — J9601 Acute respiratory failure with hypoxia: Secondary | ICD-10-CM | POA: Diagnosis not present

## 2023-03-17 DIAGNOSIS — S065XAA Traumatic subdural hemorrhage with loss of consciousness status unknown, initial encounter: Secondary | ICD-10-CM | POA: Diagnosis not present

## 2023-03-17 DIAGNOSIS — Z9889 Other specified postprocedural states: Secondary | ICD-10-CM

## 2023-03-17 DIAGNOSIS — R569 Unspecified convulsions: Secondary | ICD-10-CM | POA: Diagnosis not present

## 2023-03-17 LAB — CBC
HCT: 29.2 % — ABNORMAL LOW (ref 39.0–52.0)
Hemoglobin: 10.1 g/dL — ABNORMAL LOW (ref 13.0–17.0)
MCH: 32 pg (ref 26.0–34.0)
MCHC: 34.6 g/dL (ref 30.0–36.0)
MCV: 92.4 fL (ref 80.0–100.0)
Platelets: 148 10*3/uL — ABNORMAL LOW (ref 150–400)
RBC: 3.16 MIL/uL — ABNORMAL LOW (ref 4.22–5.81)
RDW: 13.8 % (ref 11.5–15.5)
WBC: 5.8 10*3/uL (ref 4.0–10.5)
nRBC: 0 % (ref 0.0–0.2)

## 2023-03-17 LAB — BASIC METABOLIC PANEL
Anion gap: 7 (ref 5–15)
Anion gap: 9 (ref 5–15)
BUN: 7 mg/dL (ref 6–20)
BUN: 8 mg/dL (ref 6–20)
CO2: 20 mmol/L — ABNORMAL LOW (ref 22–32)
CO2: 19 mmol/L — ABNORMAL LOW (ref 22–32)
Calcium: 6.9 mg/dL — ABNORMAL LOW (ref 8.9–10.3)
Calcium: 7.3 mg/dL — ABNORMAL LOW (ref 8.9–10.3)
Chloride: 120 mmol/L — ABNORMAL HIGH (ref 98–111)
Chloride: 120 mmol/L — ABNORMAL HIGH (ref 98–111)
Creatinine, Ser: 0.92 mg/dL (ref 0.61–1.24)
Creatinine, Ser: 0.85 mg/dL (ref 0.61–1.24)
GFR, Estimated: >60 mL/min (ref >60)
GFR, Estimated: >60 mL/min (ref >60)
Glucose, Bld: 107 mg/dL — ABNORMAL HIGH (ref 70–99)
Glucose, Bld: 127 mg/dL — ABNORMAL HIGH (ref 70–99)
Potassium: 3 mmol/L — ABNORMAL LOW (ref 3.5–5.1)
Potassium: 3.7 mmol/L (ref 3.5–5.1)
Sodium: 147 mmol/L — ABNORMAL HIGH (ref 135–145)
Sodium: 148 mmol/L — ABNORMAL HIGH (ref 135–145)

## 2023-03-17 LAB — SODIUM
Sodium: 152 mmol/L — ABNORMAL HIGH (ref 135–145)
Sodium: 148 mmol/L — ABNORMAL HIGH (ref 135–145)
Sodium: 146 mmol/L — ABNORMAL HIGH (ref 135–145)

## 2023-03-17 LAB — MAGNESIUM
Magnesium: 1.6 mg/dL — ABNORMAL LOW (ref 1.7–2.4)
Magnesium: 2.7 mg/dL — ABNORMAL HIGH (ref 1.7–2.4)

## 2023-03-17 LAB — GLUCOSE, CAPILLARY
Glucose-Capillary: 107 mg/dL — ABNORMAL HIGH (ref 70–99)
Glucose-Capillary: 122 mg/dL — ABNORMAL HIGH (ref 70–99)
Glucose-Capillary: 136 mg/dL — ABNORMAL HIGH (ref 70–99)

## 2023-03-17 LAB — PHOSPHORUS
Phosphorus: 1.1 mg/dL — ABNORMAL LOW (ref 2.5–4.6)
Phosphorus: 3.1 mg/dL (ref 2.5–4.6)

## 2023-03-17 LAB — TRIGLYCERIDES: Triglycerides: 123 mg/dL (ref <150)

## 2023-03-17 MED ORDER — PROSOURCE TF20 ENFIT COMPATIBL EN LIQD
60.0000 mL | Freq: Two times a day (BID) | ENTERAL | Status: DC
  Administered 2023-03-17 – 2023-03-26 (×18): 60 mL
  Filled 2023-03-17 (×19): qty 60

## 2023-03-17 MED ORDER — FOLIC ACID 1 MG PO TABS
1.0000 mg | ORAL_TABLET | Freq: Every day | ORAL | Status: DC
  Administered 2023-03-17 – 2023-03-27 (×10): 1 mg
  Filled 2023-03-17 (×11): qty 1

## 2023-03-17 MED ORDER — ADULT MULTIVITAMIN W/MINERALS CH
1.0000 | ORAL_TABLET | Freq: Every day | ORAL | Status: DC
  Administered 2023-03-17 – 2023-03-27 (×10): 1
  Filled 2023-03-17 (×11): qty 1

## 2023-03-17 MED ORDER — POTASSIUM PHOSPHATES 15 MMOLE/5ML IV SOLN
45.0000 mmol | Freq: Once | INTRAVENOUS | Status: AC
  Administered 2023-03-17: 45 mmol via INTRAVENOUS
  Filled 2023-03-17: qty 15

## 2023-03-17 MED ORDER — CLEVIDIPINE BUTYRATE 0.5 MG/ML IV EMUL
0.0000 mg/h | INTRAVENOUS | Status: DC
  Administered 2023-03-18 (×2): 8 mg/h via INTRAVENOUS
  Administered 2023-03-18: 6 mg/h via INTRAVENOUS
  Administered 2023-03-18: 2 mg/h via INTRAVENOUS
  Administered 2023-03-18: 8 mg/h via INTRAVENOUS
  Filled 2023-03-17 (×6): qty 50

## 2023-03-17 MED ORDER — SODIUM CHLORIDE 0.9 % IV SOLN
3.0000 g | Freq: Four times a day (QID) | INTRAVENOUS | Status: AC
  Administered 2023-03-17 – 2023-03-31 (×54): 3 g via INTRAVENOUS
  Filled 2023-03-17 (×58): qty 8

## 2023-03-17 MED ORDER — MAGNESIUM SULFATE 4 GM/100ML IV SOLN
4.0000 g | Freq: Once | INTRAVENOUS | Status: AC
  Administered 2023-03-17: 4 g via INTRAVENOUS
  Filled 2023-03-17: qty 100

## 2023-03-17 MED ORDER — SODIUM CHLORIDE 0.9 % IV SOLN: INTRAVENOUS | Status: DC

## 2023-03-17 MED ORDER — POTASSIUM CHLORIDE 10 MEQ/100ML IV SOLN
10.0000 meq | INTRAVENOUS | Status: AC
  Administered 2023-03-17 (×2): 10 meq via INTRAVENOUS
  Filled 2023-03-17 (×2): qty 100

## 2023-03-17 MED ORDER — OSMOLITE 1.5 CAL PO LIQD
1000.0000 mL | ORAL | Status: DC
  Administered 2023-03-17 – 2023-03-25 (×7): 1000 mL

## 2023-03-17 MED ORDER — THIAMINE MONONITRATE 100 MG PO TABS
100.0000 mg | ORAL_TABLET | Freq: Every day | ORAL | Status: DC
  Administered 2023-03-17 – 2023-03-27 (×10): 100 mg
  Filled 2023-03-17 (×11): qty 1
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
## 2023-03-17 NOTE — Progress Notes (Signed)
  Transition of Care [facility]) Screening Note   Patient Details  Name: Arthur Beasley Date of Birth: 06/08/1981   Transition of Care [facility]) CM/SW Contact:    Glennon Mac, RN Phone Number: 03/17/2023, 5:19 PM    Transition of Care Department [facility]) has reviewed patient and no TOC needs have been identified at this time. We will continue to monitor patient advancement through interdisciplinary progression rounds. If new patient transition needs arise, please place a TOC consult.  Quintella Baton, RN, BSN  Trauma/Neuro ICU Case Manager [phone]
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
## 2023-03-17 NOTE — Progress Notes (Signed)
Postop day 1.  No new events or problems overnight.  No evidence of seizures on EEG.  Patient has recently had sedation turned off.  He is afebrile.  His vital signs are stable.  His urine output is adequate.  Drain output is minimal.  He opens his eyes to stimulation.  Positive gag and cough.  Pupils 4 mm reactive and symmetric bilaterally.  Weak flexion with his left upper extremity noxious stimuli.  Follow-up head CT scan demonstrates resolution of right convexity subdural hematoma.  Brain is reexpanded nicely.  Poor gray-white differentiation but no evidence of obvious infarction.  Left chronic infarction stable.  Overall progressing about as well as could be expected.  Continue supportive efforts.  Will stop hypertonic saline.

## 2023-03-18 ENCOUNTER — Inpatient Hospital Stay (HOSPITAL_COMMUNITY): Payer: Medicaid Other

## 2023-03-18 DIAGNOSIS — R569 Unspecified convulsions: Secondary | ICD-10-CM | POA: Diagnosis not present

## 2023-03-18 DIAGNOSIS — J9601 Acute respiratory failure with hypoxia: Secondary | ICD-10-CM | POA: Diagnosis not present

## 2023-03-18 DIAGNOSIS — Z9889 Other specified postprocedural states: Secondary | ICD-10-CM | POA: Diagnosis not present

## 2023-03-18 DIAGNOSIS — S065XAA Traumatic subdural hemorrhage with loss of consciousness status unknown, initial encounter: Secondary | ICD-10-CM | POA: Diagnosis not present

## 2023-03-18 LAB — CBC
HCT: 32.1 % — ABNORMAL LOW (ref 39.0–52.0)
Hemoglobin: 10.6 g/dL — ABNORMAL LOW (ref 13.0–17.0)
MCH: 31.5 pg (ref 26.0–34.0)
MCHC: 33 g/dL (ref 30.0–36.0)
MCV: 95.3 fL (ref 80.0–100.0)
Platelets: 155 10*3/uL (ref 150–400)
RBC: 3.37 MIL/uL — ABNORMAL LOW (ref 4.22–5.81)
RDW: 14.1 % (ref 11.5–15.5)
WBC: 7.1 10*3/uL (ref 4.0–10.5)
nRBC: 0 % (ref 0.0–0.2)

## 2023-03-18 LAB — CULTURE, RESPIRATORY W GRAM STAIN

## 2023-03-18 LAB — BASIC METABOLIC PANEL
Anion gap: 5 (ref 5–15)
BUN: 13 mg/dL (ref 6–20)
CO2: 20 mmol/L — ABNORMAL LOW (ref 22–32)
Calcium: 7.5 mg/dL — ABNORMAL LOW (ref 8.9–10.3)
Chloride: 120 mmol/L — ABNORMAL HIGH (ref 98–111)
Creatinine, Ser: 0.84 mg/dL (ref 0.61–1.24)
GFR, Estimated: >60 mL/min (ref >60)
Glucose, Bld: 116 mg/dL — ABNORMAL HIGH (ref 70–99)
Potassium: 3.7 mmol/L (ref 3.5–5.1)
Sodium: 145 mmol/L (ref 135–145)

## 2023-03-18 LAB — GLUCOSE, CAPILLARY
Glucose-Capillary: 116 mg/dL — ABNORMAL HIGH (ref 70–99)
Glucose-Capillary: 116 mg/dL — ABNORMAL HIGH (ref 70–99)
Glucose-Capillary: 122 mg/dL — ABNORMAL HIGH (ref 70–99)
Glucose-Capillary: 132 mg/dL — ABNORMAL HIGH (ref 70–99)
Glucose-Capillary: 140 mg/dL — ABNORMAL HIGH (ref 70–99)
Glucose-Capillary: 155 mg/dL — ABNORMAL HIGH (ref 70–99)

## 2023-03-18 LAB — PHOSPHORUS: Phosphorus: 1.9 mg/dL — ABNORMAL LOW (ref 2.5–4.6)

## 2023-03-18 LAB — MAGNESIUM: Magnesium: 2.5 mg/dL — ABNORMAL HIGH (ref 1.7–2.4)

## 2023-03-18 MED ORDER — PHENOBARBITAL 32.4 MG PO TABS
64.8000 mg | ORAL_TABLET | Freq: Three times a day (TID) | ORAL | Status: AC
  Administered 2023-03-18 – 2023-03-20 (×6): 64.8 mg
  Filled 2023-03-18 (×5): qty 2

## 2023-03-18 MED ORDER — PHENOBARBITAL 32.4 MG PO TABS: 32.4000 mg | ORAL_TABLET | Freq: Three times a day (TID) | ORAL | Status: DC

## 2023-03-18 MED ORDER — ORAL CARE MOUTH RINSE
15.0000 mL | OROMUCOSAL | Status: DC
  Administered 2023-03-18 – 2023-04-20 (×105): 15 mL via OROMUCOSAL

## 2023-03-18 MED ORDER — PHENOBARBITAL 32.4 MG PO TABS
32.4000 mg | ORAL_TABLET | Freq: Three times a day (TID) | ORAL | Status: AC
  Administered 2023-03-20 – 2023-03-22 (×6): 32.4 mg
  Filled 2023-03-18 (×6): qty 1

## 2023-03-18 MED ORDER — PHENOBARBITAL 32.4 MG PO TABS
64.8000 mg | ORAL_TABLET | Freq: Three times a day (TID) | ORAL | Status: DC
  Filled 2023-03-18: qty 2

## 2023-03-18 MED ORDER — LORAZEPAM 2 MG/ML IJ SOLN
1.0000 mg | INTRAMUSCULAR | Status: DC | PRN
  Administered 2023-03-19 – 2023-04-05 (×3): 1 mg via INTRAVENOUS
  Filled 2023-03-18 (×3): qty 1

## 2023-03-18 MED ORDER — POTASSIUM PHOSPHATES 15 MMOLE/5ML IV SOLN
45.0000 mmol | Freq: Once | INTRAVENOUS | Status: AC
  Administered 2023-03-18: 45 mmol via INTRAVENOUS
  Filled 2023-03-18: qty 15

## 2023-03-18 MED ORDER — ORAL CARE MOUTH RINSE: 15.0000 mL | OROMUCOSAL | Status: DC | PRN

## 2023-03-18 NOTE — Progress Notes (Signed)
LTM maint complete - no skin breakdown under: a2

## 2023-03-18 NOTE — Progress Notes (Signed)
LTM EEG discontinued - no skin breakdown at [facility].Pt did not allow me to clean his head fully.
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
## 2023-03-18 NOTE — Procedures (Signed)
Cortrak  Person Inserting Tube:  Alaya Iverson D, RD Tube Type:  Cortrak - 43 inches Tube Size:  10 Tube Location:  Left nare Secured by: Bridle Technique Used to Measure Tube Placement:  Marking at nare/corner of mouth Cortrak Secured At:  72 cm Procedure Comments:  Cortrak Tube Team Note:  Consult received to place a Cortrak feeding tube.   X-ray is required, abdominal x-ray has been ordered by the Cortrak team. Please confirm tube placement before using the Cortrak tube.   If the tube becomes dislodged please keep the tube and contact the Cortrak team at [email or web address] for replacement.  If after hours and replacement cannot be delayed, place a NG tube and confirm placement with an abdominal x-ray.    Dontee Jaso, RD, LDN Clinical Dietitian RD pager # available in AMION  After hours/weekend pager # available in AMION    
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
## 2023-03-18 NOTE — Procedures (Signed)
Extubation Procedure Note  Patient Details:   Name: Arthur Beasley DOB: Apr 28, 1981 MRN: [id number]   Airway Documentation:    Vent end date: 03/18/23 Vent end time: 0916   Evaluation  O2 sats: stable throughout Complications: No apparent complications Patient did tolerate procedure well. Bilateral Breath Sounds: Diminished, Clear  Positive cuff leak prior to extubation. Pt extubated to 2L [place]   Yes  [name] 03/18/2023, 9:17 AM
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
## 2023-03-18 NOTE — Progress Notes (Signed)
Postop day 2.  Patient extubated today.  He is minimally verbal however this is his baseline.  He follows some simple commands with his left side.  He has spastic right hemiparesis which is stable.  His wound is clean and dry.  He is afebrile.  His vital signs are stable.  Labs look good.  Urine output good.  He is awake and appears aware.  He is minimally verbal.  He follows some simple commands on the left.  Spastic weakness on the right.  Wound clean and dry.  Chest and abdomen benign.  Overall progressing well.  Continue supportive efforts and begin therapies.

## 2023-03-19 ENCOUNTER — Inpatient Hospital Stay (HOSPITAL_COMMUNITY): Payer: Medicaid Other

## 2023-03-19 DIAGNOSIS — J9601 Acute respiratory failure with hypoxia: Secondary | ICD-10-CM | POA: Diagnosis not present

## 2023-03-19 DIAGNOSIS — Z9889 Other specified postprocedural states: Secondary | ICD-10-CM | POA: Diagnosis not present

## 2023-03-19 DIAGNOSIS — S065XAA Traumatic subdural hemorrhage with loss of consciousness status unknown, initial encounter: Secondary | ICD-10-CM | POA: Diagnosis not present

## 2023-03-19 LAB — COMPREHENSIVE METABOLIC PANEL
ALT: 50 U/L — ABNORMAL HIGH (ref 0–44)
AST: 66 U/L — ABNORMAL HIGH (ref 15–41)
Albumin: 2.4 g/dL — ABNORMAL LOW (ref 3.5–5.0)
Alkaline Phosphatase: 92 U/L (ref 38–126)
Anion gap: 10 (ref 5–15)
BUN: 10 mg/dL (ref 6–20)
CO2: 21 mmol/L — ABNORMAL LOW (ref 22–32)
Calcium: 8.4 mg/dL — ABNORMAL LOW (ref 8.9–10.3)
Chloride: 107 mmol/L (ref 98–111)
Creatinine, Ser: 0.83 mg/dL (ref 0.61–1.24)
GFR, Estimated: >60 mL/min (ref >60)
Glucose, Bld: 155 mg/dL — ABNORMAL HIGH (ref 70–99)
Potassium: 3.6 mmol/L (ref 3.5–5.1)
Sodium: 138 mmol/L (ref 135–145)
Total Bilirubin: 0.6 mg/dL (ref 0.3–1.2)
Total Protein: 6 g/dL — ABNORMAL LOW (ref 6.5–8.1)

## 2023-03-19 LAB — PHOSPHORUS
Phosphorus: 3.1 mg/dL (ref 2.5–4.6)
Phosphorus: 3 mg/dL (ref 2.5–4.6)

## 2023-03-19 LAB — GLUCOSE, CAPILLARY
Glucose-Capillary: 142 mg/dL — ABNORMAL HIGH (ref 70–99)
Glucose-Capillary: 136 mg/dL — ABNORMAL HIGH (ref 70–99)
Glucose-Capillary: 130 mg/dL — ABNORMAL HIGH (ref 70–99)
Glucose-Capillary: 116 mg/dL — ABNORMAL HIGH (ref 70–99)
Glucose-Capillary: 135 mg/dL — ABNORMAL HIGH (ref 70–99)
Glucose-Capillary: 142 mg/dL — ABNORMAL HIGH (ref 70–99)

## 2023-03-19 LAB — CULTURE, RESPIRATORY W GRAM STAIN

## 2023-03-19 LAB — MAGNESIUM
Magnesium: 1.9 mg/dL (ref 1.7–2.4)
Magnesium: 2.2 mg/dL (ref 1.7–2.4)

## 2023-03-19 MED ORDER — FREE WATER
100.0000 mL | Status: DC
  Administered 2023-03-19 – 2023-03-26 (×42): 100 mL

## 2023-03-19 MED ORDER — BACITRACIN-NEOMYCIN-POLYMYXIN OINTMENT TUBE
TOPICAL_OINTMENT | Freq: Two times a day (BID) | CUTANEOUS | Status: DC
  Administered 2023-03-19 – 2023-04-05 (×7): 1 via TOPICAL
  Filled 2023-03-19 (×3): qty 14

## 2023-03-19 MED ORDER — AMLODIPINE BESYLATE 5 MG PO TABS
5.0000 mg | ORAL_TABLET | Freq: Every day | ORAL | Status: DC
  Administered 2023-03-19 – 2023-03-27 (×9): 5 mg
  Filled 2023-03-19 (×10): qty 1

## 2023-03-19 MED ORDER — MAGNESIUM SULFATE 2 GM/50ML IV SOLN
2.0000 g | Freq: Once | INTRAVENOUS | Status: AC
  Administered 2023-03-19: 2 g via INTRAVENOUS
  Filled 2023-03-19: qty 50

## 2023-03-19 MED ORDER — BANATROL TF EN LIQD
60.0000 mL | Freq: Two times a day (BID) | ENTERAL | Status: DC
  Administered 2023-03-19 – 2023-03-25 (×13): 60 mL
  Filled 2023-03-19 (×13): qty 60

## 2023-03-19 MED ORDER — POTASSIUM CHLORIDE 10 MEQ/100ML IV SOLN
10.0000 meq | INTRAVENOUS | Status: AC
  Administered 2023-03-19 (×3): 10 meq via INTRAVENOUS
  Filled 2023-03-19 (×3): qty 100

## 2023-03-19 MED ORDER — LOSARTAN POTASSIUM 50 MG PO TABS
50.0000 mg | ORAL_TABLET | Freq: Every day | ORAL | Status: DC
  Administered 2023-03-19 – 2023-03-27 (×9): 50 mg
  Filled 2023-03-19 (×10): qty 1
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
## 2023-03-19 NOTE — Progress Notes (Signed)
OT Cancellation Note  Patient Details Name: Arthur Beasley MRN: [id number] DOB: [birth year]   Cancelled Treatment:    Reason Eval/Treat Not Completed: Medical issues which prohibited therapy (Discussesd with nsg. Pt agitate earlier and given sedating meds. Will follow up at a later time.)  [facility] 03/19/2023, 2:13 PM Luisa Dago, OT/L   Acute OT Clinical Specialist Acute Rehabilitation Services Pager [phone] Office [phone]

## 2023-03-19 NOTE — Progress Notes (Signed)
PT Cancellation Note  Patient Details Name: Arthur Beasley MRN: [id number] DOB: [birth year]   Cancelled Treatment:    Reason Eval/Treat Not Completed: Medical issues which prohibited therapy remain at this time. Per discussion with RN, Pt agitated earlier and given sedating meds. Will follow up at a later time for evaluation.  Vickki Muff, PT, DPT   Acute Rehabilitation Department Office [phone] Secure Chat Communication Preferred   Arthur Beasley 03/19/2023, 2:15 PM
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
## 2023-03-19 NOTE — Progress Notes (Signed)
   03/19/23 2300  Assess: MEWS Score  Temp 98.6 F (37 C)  BP (!) 169/97  MAP (mmHg) 120  Pulse Rate (!) 104  ECG Heart Rate (!) 104  Resp (!) 23  SpO2 97 %  O2 Device Room Air  Assess: MEWS Score  MEWS Temp 0  MEWS Systolic 0  MEWS Pulse 1  MEWS RR 1  MEWS LOC 1  MEWS Score 3  MEWS Score Color Yellow  Assess: if the MEWS score is Yellow or Red  Were vital signs taken at a resting state? Yes  Focused Assessment No change from prior assessment  Does the patient meet 2 or more of the SIRS criteria? No  MEWS guidelines implemented  Yes, yellow  Treat  MEWS Interventions Considered administering scheduled or prn medications/treatments as ordered  Take Vital Signs  Increase Vital Sign Frequency  Yellow: Q2hr x1, continue Q4hrs until patient remains green for 12hrs  Escalate  MEWS: Escalate Yellow: Discuss with charge nurse and consider notifying provider and/or RRT  Notify: Charge Nurse/RN  Name of Charge Nurse/RN Notified Celene Squibb, RN  Assess: SIRS CRITERIA  SIRS Temperature  0  SIRS Pulse 1  SIRS Respirations  1  SIRS WBC 0  SIRS Score Sum  2

## 2023-03-19 NOTE — Plan of Care (Signed)
  Problem: Education: Goal: Knowledge of the prescribed therapeutic regimen will improve Outcome: Progressing   Problem: Clinical Measurements: Goal: Usual level of consciousness will be regained or maintained. Outcome: Progressing Goal: Neurologic status will improve Outcome: Progressing Goal: Ability to maintain intracranial pressure will improve Outcome: Progressing   Problem: Skin Integrity: Goal: Demonstration of wound healing without infection will improve Outcome: Progressing   Problem: Safety: Goal: Non-violent Restraint(s) Outcome: Progressing

## 2023-03-19 NOTE — Progress Notes (Signed)
No new issues or problems.  Patient awake and aware.  Answers simple questions but is back to his baseline level of communication.  Right hemiparesis stable.  Following commands on the left side.  Wound clean and dry.  Overall doing surprisingly well.  Continue efforts at mobilization with rehab and therapy.

## 2023-03-19 NOTE — Progress Notes (Signed)
SLP Cancellation Note  Patient Details Name: Arthur Beasley MRN: [id number] DOB: 01-May-1981   Cancelled treatment:       Reason Eval/Treat Not Completed: Patient's level of consciousness. RN had to sedate pt prior to [facility], will f/u tomorrow for PO trials/readiness for MBS if still needed    [name], Riley Nearing 03/19/2023, 1:39 PM

## 2023-03-20 ENCOUNTER — Inpatient Hospital Stay (HOSPITAL_COMMUNITY): Payer: Medicaid Other

## 2023-03-20 LAB — GLUCOSE, CAPILLARY
Glucose-Capillary: 153 mg/dL — ABNORMAL HIGH (ref 70–99)
Glucose-Capillary: 140 mg/dL — ABNORMAL HIGH (ref 70–99)
Glucose-Capillary: 99 mg/dL (ref 70–99)
Glucose-Capillary: 139 mg/dL — ABNORMAL HIGH (ref 70–99)
Glucose-Capillary: 147 mg/dL — ABNORMAL HIGH (ref 70–99)

## 2023-03-20 LAB — MAGNESIUM
Magnesium: 1.9 mg/dL (ref 1.7–2.4)
Magnesium: 1.9 mg/dL (ref 1.7–2.4)

## 2023-03-20 LAB — PHOSPHORUS
Phosphorus: 3.6 mg/dL (ref 2.5–4.6)
Phosphorus: 3.7 mg/dL (ref 2.5–4.6)
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
## 2023-03-20 NOTE — Progress Notes (Signed)
Subjective: NAEs o/n  Objective: Vital signs in last 24 hours: Temp:  [98.6 F (37 C)-101.7 F (38.7 C)] 98.9 F (37.2 C) (05/03 1124) Pulse Rate:  [97-116] 104 (05/03 1124) Resp:  [18-28] 19 (05/03 1124) BP: (117-188)/(81-115) 135/105 (05/03 1124) SpO2:  [96 %-100 %] 100 % (05/03 1124) Weight:  [74 kg] 74 kg (05/03 0409)  Intake/Output from previous day: 05/02 0701 - 05/03 0700 In: 2053.1 [NG/GT:1213.3; IV Piggyback:839.8] Out: 350 [Urine:350] Intake/Output this shift: Total I/O In: -  Out: 300 [Urine:300]  Incision c/d Answers simple questions, eyes open to voice FC on left  Lab Results: Recent Labs    03/18/23 0410  WBC 7.1  HGB 10.6*  HCT 32.1*  PLT 155   BMET Recent Labs    03/18/23 0410 03/19/23 0611  NA 145 138  K 3.7 3.6  CL 120* 107  CO2 20* 21*  GLUCOSE 116* 155*  BUN 13 10  CREATININE 0.84 0.83  CALCIUM 7.5* 8.4*    Studies/Results: DG Abd Portable 1V  Result Date: 03/18/2023 CLINICAL DATA:  Feeding tube placement. EXAM: PORTABLE ABDOMEN - 1 VIEW COMPARISON:  Radiographs 03/16/2023 and 05/05/2018. FINDINGS: 1658 hours. A feeding tube has been placed, tip projecting over the right upper quadrant, likely in the distal stomach or proximal duodenum. The visualized bowel gas pattern is nonobstructive. IMPRESSION: Feeding tube tip projects over the distal stomach or proximal duodenum. Electronically Signed   By: Carey Bullocks M.D.   On: 03/18/2023 17:14    Assessment/Plan: S/p craniotomy for SDH - continue mobilization and therapies   Arthur Beasley 03/20/2023, 11:29 AM

## 2023-03-20 NOTE — Progress Notes (Addendum)
SLP Cancellation Note  Patient Details Name: Arthur Beasley MRN: [id number] DOB: 08/16/1981   Cancelled treatment:       Reason Eval/Treat Not Completed: Medical issues which prohibited therapy;Fatigue/lethargy limiting ability to participate   Per RN, pt agitated overnight and now is too lethargic for participation in SLP services. Will continue efforts as appropriate.  Clyde Canterbury, M.S., CCC-SLP Speech-Language Pathologist Secure Chat Preferred  O: [phone]  Woodroe Chen 03/20/2023, 9:22 AM
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
## 2023-03-21 LAB — CULTURE, RESPIRATORY W GRAM STAIN

## 2023-03-21 LAB — GLUCOSE, CAPILLARY
Glucose-Capillary: 133 mg/dL — ABNORMAL HIGH (ref 70–99)
Glucose-Capillary: 153 mg/dL — ABNORMAL HIGH (ref 70–99)
Glucose-Capillary: 147 mg/dL — ABNORMAL HIGH (ref 70–99)
Glucose-Capillary: 125 mg/dL — ABNORMAL HIGH (ref 70–99)
Glucose-Capillary: 118 mg/dL — ABNORMAL HIGH (ref 70–99)

## 2023-03-21 NOTE — Progress Notes (Addendum)
NEUROSURGERY PROGRESS NOTE  Doing ok, no acute events overnight. Still hemiparetic.   Temp:  [98.9 F (37.2 C)-99.7 F (37.6 C)] 99.4 F (37.4 C) (05/04 0800) Pulse Rate:  [99-104] 99 (05/03 1917) Resp:  [17-20] 17 (05/04 0800) BP: (119-141)/(88-105) 119/88 (05/04 0800) SpO2:  [100 %] 100 % (05/04 0800)  Plan: S/p crani for SDH. Continue efforts in mobilization and work with therapy.  Sherryl Manges, NP 03/21/2023 8:40 AM

## 2023-03-21 NOTE — Progress Notes (Signed)
Inpatient Rehab Admissions Coordinator:  Attempted to contact pt's sister [name] to discuss if she and pt have a made a decision regarding CIR. No answer. Left a message and awaiting return call.   Wolfgang Phoenix, MS, CCC-SLP Admissions Coordinator [phone]

## 2023-03-22 LAB — GLUCOSE, CAPILLARY
Glucose-Capillary: 104 mg/dL — ABNORMAL HIGH (ref 70–99)
Glucose-Capillary: 110 mg/dL — ABNORMAL HIGH (ref 70–99)
Glucose-Capillary: 115 mg/dL — ABNORMAL HIGH (ref 70–99)
Glucose-Capillary: 123 mg/dL — ABNORMAL HIGH (ref 70–99)
Glucose-Capillary: 124 mg/dL — ABNORMAL HIGH (ref 70–99)
Glucose-Capillary: 129 mg/dL — ABNORMAL HIGH (ref 70–99)
Glucose-Capillary: 138 mg/dL — ABNORMAL HIGH (ref 70–99)
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
## 2023-03-23 LAB — GLUCOSE, CAPILLARY
Glucose-Capillary: 110 mg/dL — ABNORMAL HIGH (ref 70–99)
Glucose-Capillary: 117 mg/dL — ABNORMAL HIGH (ref 70–99)
Glucose-Capillary: 117 mg/dL — ABNORMAL HIGH (ref 70–99)
Glucose-Capillary: 119 mg/dL — ABNORMAL HIGH (ref 70–99)
Glucose-Capillary: 119 mg/dL — ABNORMAL HIGH (ref 70–99)
Glucose-Capillary: 147 mg/dL — ABNORMAL HIGH (ref 70–99)
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
## 2023-03-23 NOTE — Progress Notes (Signed)
Inpatient Rehab Admissions Coordinator:    Family remains undecided regarding whether they want CIR or SNF for Pt. I spoke with sister over the phone and she states her brother wants to speak with me regarding some questions when he comes to visit this PM.   Megan Salon, MS, CCC-SLP Rehab Admissions Coordinator  [phone] (celll) [phone] (office)
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
## 2023-03-23 NOTE — Progress Notes (Signed)
   Providing Compassionate, Quality Care - Together   Subjective: Patient reports no new issues. Denies pain.  Objective: Vital signs in last 24 hours: Temp:  [98.1 F (36.7 C)-98.8 F (37.1 C)] 98.4 F (36.9 C) (05/06 1144) Pulse Rate:  [95-110] 110 (05/06 1144) Resp:  [13-19] 18 (05/06 1144) BP: (111-123)/(73-86) 116/73 (05/06 1144) SpO2:  [92 %-100 %] 96 % (05/06 1144) Weight:  [46.1 kg] 46.1 kg (05/06 0300)  Intake/Output from previous day: 05/05 0701 - 05/06 0700 In: 100 [NG/GT:100] Out: 2050 [Urine:2050] Intake/Output this shift: Total I/O In: 0  Out: 600 [Urine:600]  Responds to voice Oriented to self Follows commands in LUE, LLE with generalized weakness. He has spastic weakness on the right, hand contracted Minimally verbal Craniotomy site closed with staples. Site is clean dry and intact.  Lab Results: No results for input(s): "WBC", "HGB", "HCT", "PLT" in the last 72 hours. BMET No results for input(s): "NA", "K", "CL", "CO2", "GLUCOSE", "BUN", "CREATININE", "CALCIUM" in the last 72 hours.  Studies/Results: No results found.  Assessment/Plan: Patient underwent craniectomy by Dr. Jordan Likes on for evacuation of a large acute right-sided SDH with transtentorial herniation. Patient is close to his neurological baseline.   LOS: 7 days   -Continue supportive efforts -Awaiting family decision on SNF vs. CIR  Val Eagle, DNP, AGNP-C Nurse Practitioner  [facility] [address], [place], [place] [address] P: [phone]    F: [phone]  03/23/2023, 1:10 PM
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
## 2023-03-24 LAB — GLUCOSE, CAPILLARY
Glucose-Capillary: 135 mg/dL — ABNORMAL HIGH (ref 70–99)
Glucose-Capillary: 125 mg/dL — ABNORMAL HIGH (ref 70–99)
Glucose-Capillary: 123 mg/dL — ABNORMAL HIGH (ref 70–99)
Glucose-Capillary: 96 mg/dL (ref 70–99)
Glucose-Capillary: 90 mg/dL (ref 70–99)
Glucose-Capillary: 131 mg/dL — ABNORMAL HIGH (ref 70–99)

## 2023-03-24 LAB — TROPONIN I (HIGH SENSITIVITY)
Troponin I (High Sensitivity): 4 ng/L (ref <18)
Troponin I (High Sensitivity): 3 ng/L (ref <18)

## 2023-03-24 NOTE — Progress Notes (Signed)
Inpatient Rehab Admissions Coordinator:    I spoke with Pt.'s sister to discuss ability to provide support and whether family wants SNF vs. CIR. States she is moving soon and will eventually take Pt. Home with her but feels he would do better if he could go to a facility for about 6 months. Explained that this would need to be LTC at SNF, not CIR. She states understanding an agreement. TOC notified. CIR to sign off.  Megan Salon, MS, CCC-SLP Rehab Admissions Coordinator  [phone] (celll) [phone] (office)

## 2023-03-24 NOTE — Progress Notes (Signed)
Overall looks amazing.  Wide awake.  Answering questions.  Patient still with right hemiplegia which is unchanged from his premorbid state.  Moving his left side well without difficulty.  Wound healing well.  Overall patient looks quite good.  His only barrier toward discharge is getting his diet resorted.  Hopefully this will be done over the next few days.
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
## 2023-03-25 LAB — GLUCOSE, CAPILLARY
Glucose-Capillary: 131 mg/dL — ABNORMAL HIGH (ref 70–99)
Glucose-Capillary: 118 mg/dL — ABNORMAL HIGH (ref 70–99)
Glucose-Capillary: 131 mg/dL — ABNORMAL HIGH (ref 70–99)
Glucose-Capillary: 98 mg/dL (ref 70–99)
Glucose-Capillary: 109 mg/dL — ABNORMAL HIGH (ref 70–99)
Glucose-Capillary: 115 mg/dL — ABNORMAL HIGH (ref 70–99)

## 2023-03-25 MED ORDER — BANATROL TF EN LIQD
60.0000 mL | Freq: Three times a day (TID) | ENTERAL | Status: DC
  Administered 2023-03-25 – 2023-03-26 (×4): 60 mL
  Filled 2023-03-25 (×5): qty 60
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
## 2023-03-25 NOTE — Progress Notes (Signed)
No change in status.  Patient without complaints.  Denies chest pain or shortness of breath.  EKG and cardiac enzymes yesterday normal.  Awake and alert.  Follows commands on the left.  Wound clean and dry.  Progressing well.  Continue rehab efforts.  Look toward long-term skilled nursing facility placement.

## 2023-03-26 ENCOUNTER — Inpatient Hospital Stay (HOSPITAL_COMMUNITY): Payer: Medicaid Other

## 2023-03-26 LAB — GLUCOSE, CAPILLARY
Glucose-Capillary: 148 mg/dL — ABNORMAL HIGH (ref 70–99)
Glucose-Capillary: 108 mg/dL — ABNORMAL HIGH (ref 70–99)
Glucose-Capillary: 110 mg/dL — ABNORMAL HIGH (ref 70–99)
Glucose-Capillary: 98 mg/dL (ref 70–99)
Glucose-Capillary: 104 mg/dL — ABNORMAL HIGH (ref 70–99)
Glucose-Capillary: 93 mg/dL (ref 70–99)

## 2023-03-26 NOTE — Progress Notes (Signed)
Dr. Jordan Likes gave order to stop tube feeds and remove cortrak tube since patient is tolerating dinner.

## 2023-03-26 NOTE — Progress Notes (Signed)
No new issues or problems.  Patient awaiting disposition.
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
## 2023-03-27 LAB — GLUCOSE, CAPILLARY
Glucose-Capillary: 90 mg/dL (ref 70–99)
Glucose-Capillary: 99 mg/dL (ref 70–99)
Glucose-Capillary: 126 mg/dL — ABNORMAL HIGH (ref 70–99)
Glucose-Capillary: 110 mg/dL — ABNORMAL HIGH (ref 70–99)
Glucose-Capillary: 126 mg/dL — ABNORMAL HIGH (ref 70–99)
Glucose-Capillary: 106 mg/dL — ABNORMAL HIGH (ref 70–99)

## 2023-03-27 MED ORDER — FOLIC ACID 1 MG PO TABS
1.0000 mg | ORAL_TABLET | Freq: Every day | ORAL | Status: DC
  Administered 2023-03-28 – 2023-04-20 (×24): 1 mg via ORAL
  Filled 2023-03-27 (×24): qty 1

## 2023-03-27 MED ORDER — AMLODIPINE BESYLATE 5 MG PO TABS
5.0000 mg | ORAL_TABLET | Freq: Every day | ORAL | Status: DC
  Administered 2023-03-28 – 2023-04-20 (×23): 5 mg via ORAL
  Filled 2023-03-27 (×7): qty 1
  Filled 2023-03-27: qty 2
  Filled 2023-03-27 (×16): qty 1

## 2023-03-27 MED ORDER — THIAMINE MONONITRATE 100 MG PO TABS
100.0000 mg | ORAL_TABLET | Freq: Every day | ORAL | Status: DC
  Administered 2023-03-28 – 2023-04-20 (×24): 100 mg via ORAL
  Filled 2023-03-27 (×24): qty 1

## 2023-03-27 MED ORDER — LOSARTAN POTASSIUM 50 MG PO TABS
50.0000 mg | ORAL_TABLET | Freq: Every day | ORAL | Status: DC
  Administered 2023-03-28 – 2023-04-20 (×24): 50 mg via ORAL
  Filled 2023-03-27 (×24): qty 1

## 2023-03-27 MED ORDER — ADULT MULTIVITAMIN W/MINERALS CH
1.0000 | ORAL_TABLET | Freq: Every day | ORAL | Status: DC
  Administered 2023-03-28 – 2023-04-20 (×24): 1 via ORAL
  Filled 2023-03-27 (×24): qty 1

## 2023-03-27 NOTE — Progress Notes (Signed)
No new issues or problems.  Patient refusing p.o. meds today but is tolerating an oral diet well.  He is awake and aware.  He is not terribly cooperative today but does not appear to be in any distress.  His wound is healed well.  Overall progressing about as well as could be hoped for.  Continue supportive efforts and work toward placement.

## 2023-03-27 NOTE — TOC Progression Note (Signed)
Transition of Care [facility]) - Progression Note    Patient Details  Name: Arthur Beasley MRN: [id number] Date of Birth: 20-Oct-1981  [facility]) CM/SW Contact  Erin Sons, [place] Phone Number: 03/27/2023, 2:04 PM  Clinical Narrative:     CSW spoke with pt's sister who confirmed plan for SNF. CSW explained SNF w/u process. Sister agreeable for pt's check to be turned over to SNF if needed. Fl2 done and bed requests sent in hub  Expected Discharge Plan: Skilled Nursing Facility Barriers to Discharge: Continued Medical Work up  Expected Discharge Plan and Services                                               Social Determinants of Health (SDOH) Interventions SDOH Screenings   Tobacco Use: High Risk (03/17/2023)    Readmission Risk Interventions     No data to display

## 2023-03-27 NOTE — Progress Notes (Signed)
Patient refused all PO medications.  Notified MD.
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
## 2023-03-27 NOTE — Plan of Care (Signed)
  Problem: Skin Integrity: Goal: Demonstration of wound healing without infection will improve Outcome: Progressing   Problem: Clinical Measurements: Goal: Will remain free from infection Outcome: Progressing   Problem: Activity: Goal: Risk for activity intolerance will decrease Outcome: Progressing   Problem: Nutrition: Goal: Adequate nutrition will be maintained Outcome: Progressing   Problem: Elimination: Goal: Will not experience complications related to bowel motility Outcome: Progressing Goal: Will not experience complications related to urinary retention Outcome: Progressing   Problem: Pain Managment: Goal: General experience of comfort will improve Outcome: Progressing   Problem: Safety: Goal: Ability to remain free from injury will improve Outcome: Progressing

## 2023-03-27 NOTE — Progress Notes (Signed)
SLP Cancellation Note  Patient Details Name: Arthur Beasley MRN: [id number] DOB: 04-18-1981   Cancelled treatment:       Reason Eval/Treat Not Completed: SLP screened, no needs identified, will sign off (Pt's communication appears similar to that described when he was receiving SLP services in 2018. Formal evaluation of pt's cognition would be difficult due to the nature and severity of pt's speech-language deficits. However, pt denied any acute changes and his cognition has appeared appropriate/WFL during more functional contexts when he has worked with PT and OT. Formal speech-language-cognition evaluation will be deferred at this time.)  [name] I. Vear Clock, MS, CCC-SLP Neuro Diagnostic Specialist  Acute Rehabilitation Services Office number: [phone]  Scheryl Marten 03/27/2023, 2:00 PM

## 2023-03-27 NOTE — Progress Notes (Signed)
CNA was convinced patient to take his medications, and they were administered by RN.

## 2023-03-27 NOTE — Progress Notes (Signed)
       RE:   Arthur Beasley      Date of Birth:  31-May-1981     Date:   03/27/2023      To Whom It May Concern:  Please be advised that the above-named patient will require a short-term nursing home stay - anticipated 30 days or less for rehabilitation and strengthening.  The plan is for return home.

## 2023-03-28 LAB — GLUCOSE, CAPILLARY
Glucose-Capillary: 98 mg/dL (ref 70–99)
Glucose-Capillary: 92 mg/dL (ref 70–99)
Glucose-Capillary: 104 mg/dL — ABNORMAL HIGH (ref 70–99)
Glucose-Capillary: 107 mg/dL — ABNORMAL HIGH (ref 70–99)
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
## 2023-03-28 NOTE — Progress Notes (Signed)
Requested information uploaded to [employer]CM[employer]. PASRR remains pending at this time.   Dellie Burns, MSW, LCSW [phone] (coverage)

## 2023-03-28 NOTE — Plan of Care (Signed)
  Problem: Activity: Goal: Risk for activity intolerance will decrease Outcome: Progressing   Problem: Nutrition: Goal: Adequate nutrition will be maintained Outcome: Progressing   Problem: Elimination: Goal: Will not experience complications related to bowel motility Outcome: Progressing   Problem: Pain Managment: Goal: General experience of comfort will improve Outcome: Progressing   

## 2023-03-29 LAB — GLUCOSE, CAPILLARY
Glucose-Capillary: 101 mg/dL — ABNORMAL HIGH (ref 70–99)
Glucose-Capillary: 104 mg/dL — ABNORMAL HIGH (ref 70–99)
Glucose-Capillary: 105 mg/dL — ABNORMAL HIGH (ref 70–99)
Glucose-Capillary: 111 mg/dL — ABNORMAL HIGH (ref 70–99)

## 2023-03-29 MED ORDER — LEVETIRACETAM 500 MG PO TABS
1000.0000 mg | ORAL_TABLET | Freq: Two times a day (BID) | ORAL | Status: DC
  Administered 2023-03-29 – 2023-04-20 (×44): 1000 mg via ORAL
  Filled 2023-03-29 (×45): qty 2

## 2023-03-29 NOTE — Plan of Care (Signed)
  Problem: Education: Goal: Knowledge of the prescribed therapeutic regimen will improve Outcome: Progressing   Problem: Clinical Measurements: Goal: Usual level of consciousness will be regained or maintained. Outcome: Progressing Goal: Neurologic status will improve Outcome: Progressing Goal: Ability to maintain intracranial pressure will improve Outcome: Progressing   Problem: Skin Integrity: Goal: Demonstration of wound healing without infection will improve Outcome: Progressing   Problem: Education: Goal: Knowledge of General Education information will improve Description: Including pain rating scale, medication(s)/side effects and non-pharmacologic comfort measures Outcome: Progressing   Problem: Health Behavior/Discharge Planning: Goal: Ability to manage health-related needs will improve Outcome: Progressing   Problem: Clinical Measurements: Goal: Ability to maintain clinical measurements within normal limits will improve Outcome: Progressing Goal: Will remain free from infection Outcome: Progressing Goal: Diagnostic test results will improve Outcome: Progressing Goal: Cardiovascular complication will be avoided Outcome: Progressing   Problem: Activity: Goal: Risk for activity intolerance will decrease Outcome: Progressing   Problem: Nutrition: Goal: Adequate nutrition will be maintained Outcome: Progressing   Problem: Coping: Goal: Level of anxiety will decrease Outcome: Progressing   Problem: Elimination: Goal: Will not experience complications related to bowel motility Outcome: Progressing Goal: Will not experience complications related to urinary retention Outcome: Progressing   Problem: Pain Managment: Goal: General experience of comfort will improve Outcome: Progressing   Problem: Safety: Goal: Ability to remain free from injury will improve Outcome: Progressing   Problem: Skin Integrity: Goal: Risk for impaired skin integrity will  decrease Outcome: Progressing

## 2023-03-29 NOTE — Progress Notes (Signed)
PHARMACIST - PHYSICIAN COMMUNICATION  DR:   Conchita Paris  CONCERNING: IV to Oral Route Change Policy  RECOMMENDATION: This patient is receiving levetiracetam by the intravenous route.  Based on criteria approved by the Pharmacy and Therapeutics Committee, the intravenous medication(s) is/are being converted to the equivalent oral dose form(s).   DESCRIPTION: These criteria include: The patient is eating (either orally or via tube) and/or has been taking other orally administered medications for a least 24 hours The patient has no evidence of active gastrointestinal bleeding or impaired GI absorption (gastrectomy, short bowel, patient on TNA or NPO).  If you have questions about this conversion, please contact the Pharmacy Department  []   [phone] )  [facility] []   [phone] )  [facility] [x]   [phone] )  [facility] []   [phone] )  [facility] []   [phone] )  [facility]    Rexford Maus, PharmD, BCPS 03/29/2023 1:53 PM

## 2023-03-29 NOTE — Plan of Care (Signed)
  Problem: Clinical Measurements: Goal: Usual level of consciousness will be regained or maintained. Outcome: Progressing Goal: Neurologic status will improve Outcome: Progressing   Problem: Activity: Goal: Risk for activity intolerance will decrease Outcome: Progressing   Problem: Nutrition: Goal: Adequate nutrition will be maintained Outcome: Progressing   Problem: Elimination: Goal: Will not experience complications related to bowel motility Outcome: Progressing
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
## 2023-03-30 LAB — GLUCOSE, CAPILLARY
Glucose-Capillary: 119 mg/dL — ABNORMAL HIGH (ref 70–99)
Glucose-Capillary: 123 mg/dL — ABNORMAL HIGH (ref 70–99)
Glucose-Capillary: 124 mg/dL — ABNORMAL HIGH (ref 70–99)
Glucose-Capillary: 176 mg/dL — ABNORMAL HIGH (ref 70–99)
Glucose-Capillary: 85 mg/dL (ref 70–99)
Glucose-Capillary: 89 mg/dL (ref 70–99)

## 2023-03-30 MED ORDER — ENSURE ENLIVE PO LIQD
237.0000 mL | Freq: Two times a day (BID) | ORAL | Status: DC
  Administered 2023-03-30 – 2023-04-20 (×41): 237 mL via ORAL

## 2023-03-30 NOTE — Progress Notes (Signed)
PT Cancellation Note  Patient Details Name: Arthur Beasley MRN: [id number] DOB: 1981/10/28   Cancelled Treatment:    Reason Eval/Treat Not Completed: Other (comment): attempted twice to see pt.  Initially pt requesting PT to return later and upon return pt had leaking stool from flexiseal.  RN made aware.  Will attempt another day.   Elray Mcgregor 03/30/2023, 3:58 PM Sheran Lawless, PT Acute Rehabilitation Services Office:[phone] 03/30/2023

## 2023-03-30 NOTE — Progress Notes (Addendum)
   Providing Compassionate, Quality Care - Together   Subjective: Patient reports no new issues.  Objective: Vital signs in last 24 hours: Temp:  [98 F (36.7 C)-99.5 F (37.5 C)] 98 F (36.7 C) (05/13 1132) Pulse Rate:  [78-87] 80 (05/13 1132) Resp:  [12-19] 14 (05/13 1132) BP: (110-143)/(71-99) 118/82 (05/13 1132) SpO2:  [98 %-99 %] 99 % (05/13 1132) Weight:  [46.5 kg] 46.5 kg (05/13 0326)  Intake/Output from previous day: 05/12 0701 - 05/13 0700 In: 735 [IV Piggyback:690] Out: 835 [Urine:800; Stool:35] Intake/Output this shift: Total I/O In: 500 [P.O.:500] Out: -   Alert Oriented to self Follows commands in LUE, LLE with generalized weakness. He has spastic weakness on the right, hand contracted Minimally verbal Craniotomy site is well-approximated. Site is clean, dry, and intact.  Lab Results: No results for input(s): "WBC", "HGB", "HCT", "PLT" in the last 72 hours. BMET No results for input(s): "NA", "K", "CL", "CO2", "GLUCOSE", "BUN", "CREATININE", "CALCIUM" in the last 72 hours.  Studies/Results: No results found.  Assessment/Plan: Patient underwent craniectomy by Dr. Jordan Likes on for evacuation of a large acute right-sided SDH with transtentorial herniation. Patient is close to his neurological baseline.   LOS: 14 days   -Continue supportive efforts. -Awaiting SNF placement.   Val Eagle, DNP, AGNP-C Nurse Practitioner  [facility] [address], [address], [place], [place] [address] P: [phone]    F: [phone]  03/30/2023, 1:45 PM

## 2023-03-30 NOTE — TOC Progression Note (Signed)
Transition of Care [facility]) - Progression Note    Patient Details  Name: Arthur Beasley MRN: [id number] Date of Birth: Mar 23, 1981  Transition of Care [facility]) CM/SW Contact  Erin Sons, [place] Phone Number: 03/30/2023, 4:03 PM  Clinical Narrative:     CSW spoke with pt's sister. Answered questions about power of attorney and explained that chaplain responsible for assisting with healthcare power of attorney. CSW provided update on SNF bed offer with [facility]. Sister used to work there and is agreeable with that facility. CSW contacted [facility] to confirm bed. Awaiting response.   Expected Discharge Plan: Skilled Nursing Facility Barriers to Discharge: Continued Medical Work up  Expected Discharge Plan and Services                                               Social Determinants of Health (SDOH) Interventions SDOH Screenings   Tobacco Use: High Risk (03/17/2023)    Readmission Risk Interventions     No data to display

## 2023-03-30 NOTE — Plan of Care (Signed)
  Problem: Education: Goal: Knowledge of General Education information will improve Description Including pain rating scale, medication(s)/side effects and non-pharmacologic comfort measures Outcome: Progressing   Problem: Health Behavior/Discharge Planning: Goal: Ability to manage health-related needs will improve Outcome: Progressing   

## 2023-03-31 LAB — GLUCOSE, CAPILLARY
Glucose-Capillary: 108 mg/dL — ABNORMAL HIGH (ref 70–99)
Glucose-Capillary: 111 mg/dL — ABNORMAL HIGH (ref 70–99)
Glucose-Capillary: 143 mg/dL — ABNORMAL HIGH (ref 70–99)
Glucose-Capillary: 84 mg/dL (ref 70–99)
Glucose-Capillary: 88 mg/dL (ref 70–99)
Glucose-Capillary: 93 mg/dL (ref 70–99)

## 2023-03-31 NOTE — TOC Progression Note (Addendum)
Transition of Care [facility]) - Progression Note    Patient Details  Name: Arthur Beasley MRN: [id number] Date of Birth: Feb 14, 1981  Transition of Care [facility]) CM/SW Contact  Erin Sons, [place] Phone Number: 03/31/2023, 9:54 AM  Clinical Narrative:     [facility] has rescinded bed offer. Pt has no other bed offers at this time.   CSW updated pt's sister   Expected Discharge Plan: Skilled Nursing Facility Barriers to Discharge: Continued Medical Work up, snf pending bed offer                                         Social Determinants of Health (SDOH) Interventions SDOH Screenings   Tobacco Use: High Risk (03/17/2023)    Readmission Risk Interventions     No data to display

## 2023-03-31 NOTE — Progress Notes (Signed)
No new issues or problems.  Patient awaiting placement.

## 2023-03-31 NOTE — Progress Notes (Signed)
   03/31/23 1601  Spiritual Encounters  Type of Visit Declined chaplain visit  Referral source Nurse (RN/NT/LPN)  Reason for visit Advance directives  OnCall Visit No  Advance Directives (For Healthcare)  Does Patient Have a Medical Advance Directive? No  Would patient like information on creating a medical advance directive? No - Patient declined   Patient declined chaplain visit.   Arlyce Dice, Chaplain Resident [phone]
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
## 2023-04-01 LAB — GLUCOSE, CAPILLARY
Glucose-Capillary: 85 mg/dL (ref 70–99)
Glucose-Capillary: 111 mg/dL — ABNORMAL HIGH (ref 70–99)
Glucose-Capillary: 121 mg/dL — ABNORMAL HIGH (ref 70–99)
Glucose-Capillary: 134 mg/dL — ABNORMAL HIGH (ref 70–99)
Glucose-Capillary: 112 mg/dL — ABNORMAL HIGH (ref 70–99)
Glucose-Capillary: 83 mg/dL (ref 70–99)

## 2023-04-01 NOTE — Progress Notes (Signed)
No new issues or problems.  Awaiting placement.
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
## 2023-04-02 LAB — GLUCOSE, CAPILLARY
Glucose-Capillary: 106 mg/dL — ABNORMAL HIGH (ref 70–99)
Glucose-Capillary: 112 mg/dL — ABNORMAL HIGH (ref 70–99)
Glucose-Capillary: 112 mg/dL — ABNORMAL HIGH (ref 70–99)
Glucose-Capillary: 112 mg/dL — ABNORMAL HIGH (ref 70–99)
Glucose-Capillary: 78 mg/dL (ref 70–99)
Glucose-Capillary: 88 mg/dL (ref 70–99)

## 2023-04-02 NOTE — Progress Notes (Signed)
   Providing Compassionate, Quality Care - Together   Subjective: Patient resting comfortably.  Objective: Vital signs in last 24 hours: Temp:  [98.1 F (36.7 C)-98.6 F (37 C)] 98.1 F (36.7 C) (05/16 0312) Pulse Rate:  [72-83] 72 (05/16 0312) Resp:  [11-17] 12 (05/15 2330) BP: (107-122)/(81-91) 122/91 (05/16 0312) SpO2:  [95 %-98 %] 97 % (05/15 2330) Weight:  [45.4 kg] 45.4 kg (05/16 0500)  Intake/Output from previous day: 05/15 0701 - 05/16 0700 In: 600 [P.O.:600] Out: 300 [Urine:300] Intake/Output this shift: No intake/output data recorded.  Responds to voice Oriented to self Follows commands in LUE, LLE with generalized weakness. He has spastic weakness on the right, hand contracted Minimally verbal Craniotomy site is well-approximated. Site is clean, dry, and intact.  Lab Results: No results for input(s): "WBC", "HGB", "HCT", "PLT" in the last 72 hours. BMET No results for input(s): "NA", "K", "CL", "CO2", "GLUCOSE", "BUN", "CREATININE", "CALCIUM" in the last 72 hours.  Studies/Results: No results found.  Assessment/Plan: Patient underwent craniectomy by Dr. Jordan Likes on for evacuation of a large acute right-sided SDH with transtentorial herniation. Patient is close to his neurological baseline.    LOS: 17 days   -Continue supportive efforts and mobilization. -Awaiting SNF placement.   Val Eagle, DNP, AGNP-C Nurse Practitioner  [facility] [address], [place], [place] [address] P: [phone]    F: [phone]  04/02/2023, 8:21 AM
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
## 2023-04-02 NOTE — TOC Progression Note (Signed)
Transition of Care [facility]) - Progression Note    Patient Details  Name: ADRIAAN ZETTLE MRN: [id number] Date of Birth: January 15, 1981  [facility] CM/SW Contact  Eduard Roux, [place] Phone Number: 04/02/2023, 4:12 PM  Clinical Narrative:     Patient has no bed offers  TOC will continue to follow   Expected Discharge Plan: Skilled Nursing Facility Barriers to Discharge: Continued Medical Work up  Expected Discharge Plan and Services                                               Social Determinants of Health (SDOH) Interventions SDOH Screenings   Tobacco Use: High Risk (03/17/2023)    Readmission Risk Interventions     No data to display

## 2023-04-03 LAB — GLUCOSE, CAPILLARY
Glucose-Capillary: 94 mg/dL (ref 70–99)
Glucose-Capillary: 85 mg/dL (ref 70–99)
Glucose-Capillary: 115 mg/dL — ABNORMAL HIGH (ref 70–99)
Glucose-Capillary: 122 mg/dL — ABNORMAL HIGH (ref 70–99)
Glucose-Capillary: 97 mg/dL (ref 70–99)
Glucose-Capillary: 101 mg/dL — ABNORMAL HIGH (ref 70–99)
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
## 2023-04-03 NOTE — Progress Notes (Signed)
   Providing Compassionate, Quality Care - Together   Subjective: Patient and nurse report no new issues. Patient resting comfortably upon assessment.  Objective: Vital signs in last 24 hours: Temp:  [98 F (36.7 C)-98.7 F (37.1 C)] 98 F (36.7 C) (05/17 1104) Pulse Rate:  [73-97] 97 (05/17 1104) Resp:  [11-16] 11 (05/17 1104) BP: (103-133)/(77-91) 103/81 (05/17 1104) SpO2:  [90 %-98 %] 97 % (05/17 1104) Weight:  [45 kg] 45 kg (05/17 0500)  Intake/Output from previous day: 05/16 0701 - 05/17 0700 In: -  Out: 400 [Urine:400] Intake/Output this shift: Total I/O In: 200 [P.O.:200] Out: -   Responds to voice Oriented to self Follows commands in LUE, LLE with generalized weakness. He has spastic weakness on the right, hand contracted Minimally verbal Craniotomy site is well-approximated. Site is clean, dry, and intact.   Studies/Results: No results found.  Assessment/Plan: Patient underwent craniectomy by Dr. Jordan Likes on for evacuation of a large acute right-sided SDH with transtentorial herniation. Patient is close to his neurological baseline.   LOS: 18 days   -Patient can transfer to Med-Surg level of care. Discontinue telemetry. -Continue supportive efforts. -Awaiting SNF placement. No bed offers presently.   Val Eagle, DNP, AGNP-C Nurse Practitioner  [facility] [address], [address], [place], [place] [address] P: [phone]    F: [phone]  04/03/2023, 11:20 AM

## 2023-04-04 LAB — GLUCOSE, CAPILLARY
Glucose-Capillary: 104 mg/dL — ABNORMAL HIGH (ref 70–99)
Glucose-Capillary: 105 mg/dL — ABNORMAL HIGH (ref 70–99)
Glucose-Capillary: 105 mg/dL — ABNORMAL HIGH (ref 70–99)
Glucose-Capillary: 110 mg/dL — ABNORMAL HIGH (ref 70–99)
Glucose-Capillary: 118 mg/dL — ABNORMAL HIGH (ref 70–99)

## 2023-04-04 NOTE — Progress Notes (Signed)
Neurosurgery Service Progress Note  Subjective: No acute events overnight, no new complaints   Objective: Vitals:   04/03/23 2339 04/04/23 0337 04/04/23 0500 04/04/23 0824  BP: (!) 133/101 (!) 137/102  120/73  Pulse: 99 90  92  Resp: 12 12  13   Temp: 98.6 F (37 C) 98.7 F (37.1 C)  98.5 F (36.9 C)  TempSrc: Oral Oral  Oral  SpO2: 96% 100%  97%  Weight:   45 kg   Height:        Physical Exam: Tries to keep blanket over his head, awake, nods and mumbles to questions, R spastic hemiparesis, FC on the left, incision c/d/i  Assessment & Plan: 42 y.o. man w/ prior L hemicrani / cranoplasty then large R SDH s/p evac.  -SNF pending -activity as tolerated  Jadene Pierini  04/04/23 9:28 AM

## 2023-04-05 LAB — GLUCOSE, CAPILLARY
Glucose-Capillary: 113 mg/dL — ABNORMAL HIGH (ref 70–99)
Glucose-Capillary: 86 mg/dL (ref 70–99)
Glucose-Capillary: 123 mg/dL — ABNORMAL HIGH (ref 70–99)
Glucose-Capillary: 113 mg/dL — ABNORMAL HIGH (ref 70–99)
Glucose-Capillary: 109 mg/dL — ABNORMAL HIGH (ref 70–99)
Glucose-Capillary: 110 mg/dL — ABNORMAL HIGH (ref 70–99)

## 2023-04-05 NOTE — Progress Notes (Signed)
Neurosurgery Service Progress Note  Subjective: No acute events overnight, no new complaints  Objective: Vitals:   04/04/23 2002 04/04/23 2322 04/05/23 0348 04/05/23 0500  BP: 124/79  105/80   Pulse: 90 93 87   Resp: 15  12   Temp: 98.6 F (37 C) 98 F (36.7 C)    TempSrc: Oral Oral Oral   SpO2: 96% 98% 92%   Weight:    45 kg  Height:        Physical Exam: Sitting up in bed watching TV, awake, nonverbal but nods to questions, R spastic hemiparesis, FC on the left, incision healing well  Assessment & Plan: 42 y.o. man w/ prior L hemicrani / cranoplasty then large R SDH s/p evac.  -SNF pending -activity as tolerated  Jadene Pierini  04/05/23 10:51 AM

## 2023-04-06 LAB — GLUCOSE, CAPILLARY
Glucose-Capillary: 101 mg/dL — ABNORMAL HIGH (ref 70–99)
Glucose-Capillary: 106 mg/dL — ABNORMAL HIGH (ref 70–99)
Glucose-Capillary: 108 mg/dL — ABNORMAL HIGH (ref 70–99)

## 2023-04-06 NOTE — Progress Notes (Signed)
   Providing Compassionate, Quality Care - Together   Subjective: Patient reports he is ready for lunch. No new issues.  Objective: Vital signs in last 24 hours: Temp:  [98 F (36.7 C)-98.4 F (36.9 C)] 98 F (36.7 C) (05/20 1131) Pulse Rate:  [89-100] 98 (05/20 1131) Resp:  [11-20] 18 (05/20 1131) BP: (98-118)/(66-89) 118/81 (05/20 1131) SpO2:  [93 %-99 %] 93 % (05/20 1131) Weight:  [45 kg] 45 kg (05/20 0400)  Intake/Output from previous day: 05/19 0701 - 05/20 0700 In: 400 [P.O.:400] Out: 300 [Urine:300] Intake/Output this shift: No intake/output data recorded.   Alert Oriented to self Follows commands in LUE, LLE with generalized weakness. He has spastic weakness on the right, hand contracted Minimally verbal Craniotomy site is well-approximated. Site is clean, dry, and intact.  Lab Results: No results for input(s): "WBC", "HGB", "HCT", "PLT" in the last 72 hours. BMET No results for input(s): "NA", "K", "CL", "CO2", "GLUCOSE", "BUN", "CREATININE", "CALCIUM" in the last 72 hours.  Studies/Results: No results found.  Assessment/Plan: Patient underwent craniectomy by Dr. Jordan Likes on for evacuation of a large acute right-sided SDH with transtentorial herniation. Patient is close to his neurological baseline.    LOS: 21 days   -Continue supportive efforts. -Awaiting SNF placement. No bed offers presently.   Val Eagle, DNP, AGNP-C Nurse Practitioner  [facility] [address], [address], [place], [place] [address] P: [phone]    F: [phone]  04/06/2023, 11:48 AM
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
## 2023-04-06 NOTE — TOC Progression Note (Addendum)
Transition of Care [facility]) - Progression Note    Patient Details  Name: Arthur Beasley MRN: [id number] Date of Birth: 09-09-1981  [facility] CM/SW Contact  Eduard Roux, [place] Phone Number: 04/06/2023, 10:37 AM  Clinical Narrative:     TOC continues to follow, patient has no bed offers.  TOC continues to follow   Antony Blackbird, MSW, LCSW Clinical Social Worker     Expected Discharge Plan: Skilled Nursing Facility Barriers to Discharge: Continued Medical Work up  Expected Discharge Plan and Services                                               Social Determinants of Health (SDOH) Interventions SDOH Screenings   Tobacco Use: High Risk (03/17/2023)    Readmission Risk Interventions     No data to display

## 2023-04-07 LAB — GLUCOSE, CAPILLARY
Glucose-Capillary: 122 mg/dL — ABNORMAL HIGH (ref 70–99)
Glucose-Capillary: 115 mg/dL — ABNORMAL HIGH (ref 70–99)
Glucose-Capillary: 126 mg/dL — ABNORMAL HIGH (ref 70–99)
Glucose-Capillary: 92 mg/dL (ref 70–99)
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
## 2023-04-07 NOTE — Progress Notes (Signed)
Overall stable.  No new issues or problems.  Awaiting placement.

## 2023-04-07 NOTE — TOC Progression Note (Addendum)
Transition of Care [facility]) - Progression Note    Patient Details  Name: Arthur Beasley MRN: [id number] Date of Birth: September 28, 1981  [facility]) CM/SW Contact  Carley Hammed, LCSW Phone Number: 04/07/2023, 1:37 PM  Clinical Narrative:    2:20 Pt has been accepted to [facility], family in agreement. Facility working with family on paperwork. CSW messaged medical team to advise of plan. TOC will continue to follow for DC needs.    CSW reviewed pt's chart and the current plan. Pt with new admission on 6N after an extensive ICU stay. Current plan is for pt to receive SNF services for several months, prior to pt going home with sister. CSW noting that pt currently does not have any bed offers and has several barriers to placement. CSW faxed out pt's referral further and followed up with several facilities requesting they review for admission. TOC will continue to follow for DC needs.    Expected Discharge Plan: Skilled Nursing Facility Barriers to Discharge: Insurance Authorization, No SNF bed (age)  Expected Discharge Plan and Services                                               Social Determinants of Health (SDOH) Interventions SDOH Screenings   Tobacco Use: High Risk (03/17/2023)    Readmission Risk Interventions     No data to display

## 2023-04-08 NOTE — Progress Notes (Signed)
No new issues or problems.  Patient ready for discharge when bed available.
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
## 2023-04-08 NOTE — TOC Progression Note (Signed)
Transition of Care [facility]) - Progression Note    Patient Details  Name: Arthur Beasley MRN: [id number] Date of Birth: 08-07-1981  [facility]) CM/SW Contact  Carley Hammed, LCSW Phone Number: 04/08/2023, 11:29 AM  Clinical Narrative:    CSW spoke with facility who advised that they will accept pt and have spoken with payee. The barrier is that pt has already spent his Medicaid check for this month. The facility is unable to take pt until the beginning of the month when the next check comes in. CSW notified medical team of delay. CSW consulted supervision, but since pt has insurance, the hospital is unable to pay the difference. CSW will continue to follow for DC planning.    Expected Discharge Plan: Skilled Nursing Facility Barriers to Discharge: Insurance Authorization, No SNF bed (age)  Expected Discharge Plan and Services                                               Social Determinants of Health (SDOH) Interventions SDOH Screenings   Tobacco Use: High Risk (03/17/2023)    Readmission Risk Interventions     No data to display

## 2023-04-09 NOTE — Progress Notes (Signed)
   Providing Compassionate, Quality Care - Together   Subjective: Patient reports no issues.  Objective: Vital signs in last 24 hours: Temp:  [97.6 F (36.4 C)-98.3 F (36.8 C)] 97.6 F (36.4 C) (05/23 0837) Pulse Rate:  [85-87] 85 (05/23 0837) Resp:  [17] 17 (05/23 0837) BP: (121-129)/(87-96) 124/87 (05/23 1108) SpO2:  [98 %-100 %] 100 % (05/23 0837) Weight:  [44.6 kg] 44.6 kg (05/23 0409)  Intake/Output from previous day: 05/22 0701 - 05/23 0700 In: 370 [P.O.:370] Out: 400 [Urine:400] Intake/Output this shift: Total I/O In: 120 [P.O.:120] Out: 250 [Urine:250]  Alert Oriented to self Follows commands in LUE, LLE with generalized weakness. He has spastic weakness on the right, hand contracted Minimally verbal Craniotomy site is well-approximated. Site is clean, dry, and intact.    Lab Results: No results for input(s): "WBC", "HGB", "HCT", "PLT" in the last 72 hours. BMET No results for input(s): "NA", "K", "CL", "CO2", "GLUCOSE", "BUN", "CREATININE", "CALCIUM" in the last 72 hours.  Studies/Results: No results found.  Assessment/Plan: Patient underwent craniectomy by Dr. Jordan Likes on for evacuation of a large acute right-sided SDH with transtentorial herniation. Patient is close to his neurological baseline.    LOS: 24 days   -Continue supportive efforts -SNF placement unable to take place until 04/18/2023.   Val Eagle, DNP, AGNP-C Nurse Practitioner  [facility] [address], [address], [place], [place] [address] P: [phone]    F: [phone]  04/09/2023, 11:19 AM

## 2023-04-09 NOTE — Progress Notes (Signed)
Pt refusing oral care, hygiene care and vital signs at this time. Pt denies pain.

## 2023-04-09 NOTE — Plan of Care (Signed)
  Problem: Clinical Measurements: Goal: Neurologic status will improve Outcome: Progressing   Problem: Skin Integrity: Goal: Demonstration of wound healing without infection will improve Outcome: Progressing   Problem: Nutrition: Goal: Adequate nutrition will be maintained Outcome: Progressing

## 2023-04-10 NOTE — Plan of Care (Signed)
  Problem: Education: Goal: Knowledge of the prescribed therapeutic regimen will improve Outcome: Progressing   Problem: Clinical Measurements: Goal: Neurologic status will improve Outcome: Progressing   Problem: Skin Integrity: Goal: Demonstration of wound healing without infection will improve Outcome: Progressing   Problem: Health Behavior/Discharge Planning: Goal: Ability to manage health-related needs will improve Outcome: Progressing   Problem: Clinical Measurements: Goal: Will remain free from infection Outcome: Progressing   Problem: Activity: Goal: Risk for activity intolerance will decrease Outcome: Progressing   Problem: Elimination: Goal: Will not experience complications related to bowel motility Outcome: Progressing

## 2023-04-10 NOTE — Progress Notes (Signed)
PT Cancellation Note  Patient Details Name: Arthur Beasley MRN: [id number] DOB: 1981/02/11   Cancelled Treatment:    Reason Eval/Treat Not Completed: Other (comment) (Patient declined, wants to eat lunch first. PT will continue with attempts) Donna Bernard, PT, MPT   Ina Homes 04/10/2023, 2:21 PM

## 2023-04-10 NOTE — Plan of Care (Signed)
  Problem: Skin Integrity: Goal: Demonstration of wound healing without infection will improve Outcome: Progressing   Problem: Clinical Measurements: Goal: Will remain free from infection Outcome: Progressing   Problem: Nutrition: Goal: Adequate nutrition will be maintained Outcome: Progressing

## 2023-04-10 NOTE — Progress Notes (Signed)
   Providing Compassionate, Quality Care - Together   Subjective: Patient reports no issues. Denies headache.  Objective: Vital signs in last 24 hours: Temp:  [97.9 F (36.6 C)-98.1 F (36.7 C)] 97.9 F (36.6 C) (05/24 0816) Pulse Rate:  [84-99] 89 (05/24 0816) Resp:  [16-17] 17 (05/24 0816) BP: (104-114)/(75-90) 112/82 (05/24 0816) SpO2:  [98 %-100 %] 100 % (05/24 0816) Weight:  [46 kg] 46 kg (05/24 0500)  Intake/Output from previous day: 05/23 0701 - 05/24 0700 In: 290 [P.O.:290] Out: 250 [Urine:250] Intake/Output this shift: Total I/O In: 50 [P.O.:50] Out: -   Alert Oriented to self Follows commands in LUE, LLE with generalized weakness. He has spastic weakness on the right, hand contracted Minimally verbal Craniotomy site is well-approximated. Site is clean, dry, and intact.  Lab Results: No results for input(s): "WBC", "HGB", "HCT", "PLT" in the last 72 hours. BMET No results for input(s): "NA", "K", "CL", "CO2", "GLUCOSE", "BUN", "CREATININE", "CALCIUM" in the last 72 hours.  Studies/Results: No results found.  Assessment/Plan: Patient underwent craniectomy by Dr. Jordan Likes on for evacuation of a large acute right-sided SDH with transtentorial herniation. Patient is close to his neurological baseline.   LOS: 25 days   -Continue supportive efforts -SNF placement unable to take place until 04/18/2023.   Val Eagle, DNP, AGNP-C Nurse Practitioner  [facility] [address], [address], [place], [place] [address] P: [phone]    F: [phone]  04/10/2023, 2:39 PM

## 2023-04-10 NOTE — Plan of Care (Signed)
  Problem: Education: Goal: Knowledge of the prescribed therapeutic regimen will improve Outcome: Progressing   Problem: Clinical Measurements: Goal: Usual level of consciousness will be regained or maintained. Outcome: Progressing   Problem: Skin Integrity: Goal: Demonstration of wound healing without infection will improve Outcome: Progressing   Problem: Health Behavior/Discharge Planning: Goal: Ability to manage health-related needs will improve Outcome: Progressing   Problem: Activity: Goal: Risk for activity intolerance will decrease Outcome: Progressing   Problem: Pain Managment: Goal: General experience of comfort will improve Outcome: Progressing   Problem: Safety: Goal: Ability to remain free from injury will improve Outcome: Progressing
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
## 2023-04-10 NOTE — TOC Progression Note (Signed)
Transition of Care [facility]) - Progression Note    Patient Details  Name: Arthur Beasley MRN: [id number] Date of Birth: Dec 12, 1980  Transition of Care [facility]) CM/SW Contact  Carley Hammed, LCSW Phone Number: 04/10/2023, 12:07 PM  Clinical Narrative:    CSW left VM for pt's sister to provide updates. VM left. TOC will continue to follow for placement.    Expected Discharge Plan: Skilled Nursing Facility Barriers to Discharge: Insurance Authorization, No SNF bed (age)  Expected Discharge Plan and Services                                               Social Determinants of Health (SDOH) Interventions SDOH Screenings   Tobacco Use: High Risk (03/17/2023)    Readmission Risk Interventions     No data to display

## 2023-04-11 MED ORDER — HYDROCODONE-ACETAMINOPHEN 5-325 MG PO TABS
1.0000 | ORAL_TABLET | Freq: Four times a day (QID) | ORAL | Status: DC | PRN
  Administered 2023-04-12 – 2023-04-20 (×4): 1 via ORAL
  Filled 2023-04-11 (×4): qty 1

## 2023-04-11 NOTE — Progress Notes (Signed)
   Providing Compassionate, Quality Care - Together   Subjective: Patient reports headache this morning.  Objective: Vital signs in last 24 hours: Temp:  [97.8 F (36.6 C)-98.2 F (36.8 C)] 97.8 F (36.6 C) (05/25 0820) Pulse Rate:  [82-92] 92 (05/25 0820) Resp:  [16-18] 18 (05/25 0820) BP: (109-118)/(74-92) 118/92 (05/25 0820) SpO2:  [95 %-99 %] 95 % (05/25 0820) Weight:  [46.3 kg] 46.3 kg (05/25 0500)  Intake/Output from previous day: 05/24 0701 - 05/25 0700 In: 440 [P.O.:440] Out: 250 [Urine:250] Intake/Output this shift: Total I/O In: 240 [P.O.:240] Out: -   Alert Oriented to self Follows commands in LUE, LLE with generalized weakness. He has spastic weakness on the right, hand contracted Minimally verbal Craniotomy site is well-approximated. Site is clean, dry, and intact.  Lab Results: No results for input(s): "WBC", "HGB", "HCT", "PLT" in the last 72 hours. BMET No results for input(s): "NA", "K", "CL", "CO2", "GLUCOSE", "BUN", "CREATININE", "CALCIUM" in the last 72 hours.  Studies/Results: No results found.  Assessment/Plan: Patient underwent craniotomy by Dr. Jordan Likes on 03/16/2023 for evacuation of a large acute right-sided SDH with transtentorial herniation. Patient is close to his neurological baseline.   LOS: 26 days    -Discontinued hydromorphone. Added hydrocodone prn.   Val Eagle, DNP, AGNP-C Nurse Practitioner  [facility] [address], [place], [place] [address] P: [phone]    F: [phone]  04/11/2023, 9:03 AM

## 2023-04-12 NOTE — Progress Notes (Signed)
Patient ID: Arthur Beasley, male   DOB: 1981/05/03, 42 y.o.   MRN: [id number] Vital signs are stable.  Patient remains comfortable.  He is awaiting placement.
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
## 2023-04-13 NOTE — Progress Notes (Signed)
Patient ID: Arthur Beasley, male   DOB: [birth year], 42 y.o.   MRN: [id number] Patient is alert oriented and moving all extremities well.  He is awaiting placement.  His functional status is stable

## 2023-04-13 NOTE — TOC Progression Note (Signed)
Transition of Care [facility]) - Progression Note    Patient Details  Name: Arthur Beasley MRN: [id number] Date of Birth: [birth year]  Transition of Care [facility]) CM/SW Contact  Mearl Latin, LCSW Phone Number: 04/13/2023, 10:18 AM  Clinical Narrative:    TOC following for placement needs (tentative plan for 6/1 at [facility] once patient's Medicaid check arrives).    Expected Discharge Plan: Skilled Nursing Facility Barriers to Discharge: Insurance Authorization, No SNF bed (age)  Expected Discharge Plan and Services                                               Social Determinants of Health (SDOH) Interventions SDOH Screenings   Tobacco Use: High Risk (03/17/2023)    Readmission Risk Interventions     No data to display

## 2023-04-14 NOTE — Progress Notes (Signed)
No new issues or problems.  Patient's neurologic status stable.  Awaiting placement.
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
## 2023-04-15 NOTE — Progress Notes (Signed)
Overall stable.  Awaiting placement.

## 2023-04-15 NOTE — TOC Progression Note (Signed)
Transition of Care [facility]) - Progression Note    Patient Details  Name: JEMUEL GLESSNER MRN: [id number] Date of Birth: Jun 22, 1981  [facility]) CM/SW Contact  Carley Hammed, LCSW Phone Number: 04/15/2023, 2:30 PM  Clinical Narrative:     CSW confirmed with facility that pt can DC to [facility] on  Monday. CSW left VM updating sister on DC plan. TOC will continue to follow for DC needs.   Expected Discharge Plan: Skilled Nursing Facility Barriers to Discharge: Insurance Authorization, No SNF bed (age)  Expected Discharge Plan and Services                                               Social Determinants of Health (SDOH) Interventions SDOH Screenings   Tobacco Use: High Risk (03/17/2023)    Readmission Risk Interventions     No data to display

## 2023-04-16 NOTE — TOC Progression Note (Signed)
Transition of Care [facility]) - Progression Note    Patient Details  Name: Arthur Beasley MRN: [id number] Date of Birth: [birth year]  [facility] Care [facility]) CM/SW Contact  Carley Hammed, LCSW Phone Number: 04/16/2023, 2:54 PM  Clinical Narrative:    CSW met with sister [name] at bedside. CSW confirmed plan for DC on Monday, [name] agreeable. She asks to be notified on Monday when the DC time is indicated so she can meet pt over at the facility. TOC will continue to follow for DC needs.    Expected Discharge Plan: Skilled Nursing Facility Barriers to Discharge: Insurance Authorization, No SNF bed (age)  Expected Discharge Plan and Services                                               Social Determinants of Health (SDOH) Interventions SDOH Screenings   Tobacco Use: High Risk (03/17/2023)    Readmission Risk Interventions     No data to display

## 2023-04-16 NOTE — Progress Notes (Signed)
   Providing Compassionate, Quality Care - Together   Subjective: Patient reports he is hungry. He denies pain.  Objective: Vital signs in last 24 hours: Temp:  [97.5 F (36.4 C)-98.3 F (36.8 C)] 98.3 F (36.8 C) (05/30 1615) Pulse Rate:  [78-97] 89 (05/30 1615) Resp:  [16-17] 17 (05/30 1615) BP: (103-115)/(67-82) 103/74 (05/30 1615) SpO2:  [100 %] 100 % (05/30 1615) Weight:  [47.3 kg] 47.3 kg (05/30 0500)  Intake/Output from previous day: 05/29 0701 - 05/30 0700 In: 1200 [P.O.:1200] Out: 200 [Urine:200] Intake/Output this shift: Total I/O In: 240 [P.O.:240] Out: 300 [Urine:300]  Alert Oriented to self Follows commands in LUE, LLE with generalized weakness. He has spastic weakness on the right, hand contracted Minimally verbal Craniotomy site is well-healed  Lab Results: No results for input(s): "WBC", "HGB", "HCT", "PLT" in the last 72 hours. BMET No results for input(s): "NA", "K", "CL", "CO2", "GLUCOSE", "BUN", "CREATININE", "CALCIUM" in the last 72 hours.  Studies/Results: No results found.  Assessment/Plan: Patient underwent craniotomy by Dr. Jordan Likes on 03/16/2023 for evacuation of a large acute right-sided SDH with transtentorial herniation. Patient appears to be at his neurological baseline.   LOS: 31 days   -Awaiting SNF placement   Val Eagle, DNP, AGNP-C Nurse Practitioner  [facility] [address], [place], [place] [address] P: [phone]    F: [phone]  04/16/2023, 11:50 AM

## 2023-04-16 NOTE — Plan of Care (Signed)
  Problem: Education: Goal: Knowledge of the prescribed therapeutic regimen will improve Outcome: Progressing   Problem: Clinical Measurements: Goal: Usual level of consciousness will be regained or maintained. Outcome: Progressing Goal: Neurologic status will improve Outcome: Progressing Goal: Ability to maintain intracranial pressure will improve Outcome: Progressing   Problem: Skin Integrity: Goal: Demonstration of wound healing without infection will improve Outcome: Progressing   Problem: Education: Goal: Knowledge of General Education information will improve Description: Including pain rating scale, medication(s)/side effects and non-pharmacologic comfort measures Outcome: Progressing   Problem: Health Behavior/Discharge Planning: Goal: Ability to manage health-related needs will improve Outcome: Progressing   Problem: Clinical Measurements: Goal: Ability to maintain clinical measurements within normal limits will improve Outcome: Progressing Goal: Will remain free from infection Outcome: Progressing Goal: Diagnostic test results will improve Outcome: Progressing Goal: Cardiovascular complication will be avoided Outcome: Progressing   Problem: Activity: Goal: Risk for activity intolerance will decrease Outcome: Progressing   Problem: Nutrition: Goal: Adequate nutrition will be maintained Outcome: Progressing   Problem: Coping: Goal: Level of anxiety will decrease Outcome: Progressing   Problem: Elimination: Goal: Will not experience complications related to bowel motility Outcome: Progressing Goal: Will not experience complications related to urinary retention Outcome: Progressing   Problem: Pain Managment: Goal: General experience of comfort will improve Outcome: Progressing   Problem: Safety: Goal: Ability to remain free from injury will improve Outcome: Progressing   Problem: Skin Integrity: Goal: Risk for impaired skin integrity will  decrease Outcome: Progressing   

## 2023-04-17 NOTE — Progress Notes (Signed)
   Providing Compassionate, Quality Care - Together   Subjective: Patient reports no new issues.  Objective: Vital signs in last 24 hours: Temp:  [98.1 F (36.7 C)-98.3 F (36.8 C)] 98.2 F (36.8 C) (05/31 0815) Pulse Rate:  [85-91] 85 (05/31 0815) Resp:  [17-21] 17 (05/31 0815) BP: (103-113)/(74-85) 111/75 (05/31 0815) SpO2:  [99 %-100 %] 100 % (05/31 0815) Weight:  [48.8 kg] 48.8 kg (05/31 0442)  Intake/Output from previous day: 05/30 0701 - 05/31 0700 In: 240 [P.O.:240] Out: 300 [Urine:300] Intake/Output this shift: No intake/output data recorded.  Alert Oriented to self Follows commands in LUE, LLE with generalized weakness. He has spastic weakness on the right, hand contracted Minimally verbal Craniotomy site is well-healed    Lab Results: No results for input(s): "WBC", "HGB", "HCT", "PLT" in the last 72 hours. BMET No results for input(s): "NA", "K", "CL", "CO2", "GLUCOSE", "BUN", "CREATININE", "CALCIUM" in the last 72 hours.  Studies/Results: No results found.  Assessment/Plan: Patient underwent craniotomy by Dr. Jordan Likes on 03/16/2023 for evacuation of a large acute right-sided SDH with transtentorial herniation. Patient appears to be at his neurological baseline.    LOS: 32 days   -Awaiting SNF placement   Val Eagle, DNP, AGNP-C Nurse Practitioner  [facility] [address], [place], [place] [address] P: [phone]    F: [phone]  04/17/2023, 10:49 AM

## 2023-04-17 NOTE — Plan of Care (Signed)
  Problem: Education: Goal: Knowledge of the prescribed therapeutic regimen will improve Outcome: Progressing   Problem: Clinical Measurements: Goal: Usual level of consciousness will be regained or maintained. Outcome: Progressing Goal: Neurologic status will improve Outcome: Progressing Goal: Ability to maintain intracranial pressure will improve Outcome: Progressing   Problem: Skin Integrity: Goal: Demonstration of wound healing without infection will improve Outcome: Progressing   Problem: Education: Goal: Knowledge of General Education information will improve Description: Including pain rating scale, medication(s)/side effects and non-pharmacologic comfort measures Outcome: Progressing   Problem: Health Behavior/Discharge Planning: Goal: Ability to manage health-related needs will improve Outcome: Progressing   Problem: Clinical Measurements: Goal: Ability to maintain clinical measurements within normal limits will improve Outcome: Progressing Goal: Will remain free from infection Outcome: Progressing Goal: Diagnostic test results will improve Outcome: Progressing Goal: Cardiovascular complication will be avoided Outcome: Progressing   Problem: Activity: Goal: Risk for activity intolerance will decrease Outcome: Progressing   Problem: Nutrition: Goal: Adequate nutrition will be maintained Outcome: Progressing   Problem: Coping: Goal: Level of anxiety will decrease Outcome: Progressing   Problem: Elimination: Goal: Will not experience complications related to bowel motility Outcome: Progressing Goal: Will not experience complications related to urinary retention Outcome: Progressing   Problem: Pain Managment: Goal: General experience of comfort will improve Outcome: Progressing   Problem: Safety: Goal: Ability to remain free from injury will improve Outcome: Progressing   Problem: Skin Integrity: Goal: Risk for impaired skin integrity will  decrease Outcome: Progressing   

## 2023-04-18 NOTE — Progress Notes (Signed)
Patient ID: Arthur Beasley, male   DOB: December 19, 1980, 42 y.o.   MRN: [id number] BP 106/74 (BP Location: Left Arm)   Pulse 89   Temp 98.2 F (36.8 C) (Oral)   Resp 18   Ht 5\' 8"  (1.727 m)   Wt 48.3 kg   SpO2 100%   BMI 16.19 kg/m  Alert, following commands Moving extremities Stable Placement anticipated for monday

## 2023-04-19 NOTE — Progress Notes (Signed)
Patient ID: Arthur Beasley, male   DOB: 1981-02-28, 42 y.o.   MRN: [id number] BP 110/77 (BP Location: Left Arm)   Pulse 83   Temp 98.1 F (36.7 C) (Oral)   Resp 17   Ht 5\' 8"  (1.727 m)   Wt 48.3 kg   SpO2 99%   BMI 16.19 kg/m  Alert and oriented x 4 speech is non fluent Moving all extremities wAiting on placement

## 2023-04-20 MED ORDER — ADULT MULTIVITAMIN W/MINERALS CH: 1.0000 | ORAL_TABLET | Freq: Every day | ORAL | Status: AC

## 2023-04-20 MED ORDER — LEVETIRACETAM 1000 MG PO TABS: 1000.0000 mg | ORAL_TABLET | Freq: Two times a day (BID) | ORAL | 2 refills | Status: AC

## 2023-04-20 MED ORDER — FOLIC ACID 1 MG PO TABS: 1.0000 mg | ORAL_TABLET | Freq: Every day | ORAL | Status: AC

## 2023-04-20 MED ORDER — LOSARTAN POTASSIUM 50 MG PO TABS: 50.0000 mg | ORAL_TABLET | Freq: Every day | ORAL | Status: AC

## 2023-04-20 MED ORDER — AMLODIPINE BESYLATE 5 MG PO TABS: 5.0000 mg | ORAL_TABLET | Freq: Every day | ORAL | Status: AC

## 2023-04-20 MED ORDER — ENSURE ENLIVE PO LIQD: 237.0000 mL | Freq: Two times a day (BID) | ORAL | 12 refills | Status: AC

## 2023-04-20 MED ORDER — VITAMIN B-1 100 MG PO TABS: 100.0000 mg | ORAL_TABLET | Freq: Every day | ORAL | Status: AC

## 2023-04-20 NOTE — TOC Transition Note (Signed)
Transition of Care [facility]) - CM/SW Discharge Note   Patient Details  Name: MEKI SUSTAITA MRN: [id number] Date of Birth: Jan 11, 1981  Transition of Care [facility]) CM/SW Contact:  Erin Sons, LCSW Phone Number: 04/20/2023, 11:37 AM   Clinical Narrative:     Patient will DC to: [facility] Anticipated DC date: 04/20/23 Family notified: Sister Information systems manager Transport by: Lorenda Ishihara   Per MD patient ready for DC to [facility] SNF. RN, patient, patient's family, and facility notified of DC. Discharge Summary and FL2 sent to facility. RN to call report prior to discharge [phone] ). Sister [name] will transport in private vehicle. She states she will arrive for pick up around 2pm.   CSW will sign off for now as social work intervention is no longer needed. Please consult [place] again if new needs arise.   Final next level of care: Skilled Nursing Facility Barriers to Discharge: No Barriers Identified   Patient Goals and CMS Choice      Discharge Placement                Patient chooses bed at: [facility] Patient to be transferred to facility by: Sister will transport in private vehicle Name of family member notified: Sister Marval Regal Patient and family notified of of transfer: 04/20/23  Discharge Plan and Services Additional resources added to the After Visit Summary for                                       Social Determinants of Health (SDOH) Interventions SDOH Screenings   Tobacco Use: High Risk (03/17/2023)     Readmission Risk Interventions     No data to display

## 2023-04-20 NOTE — Progress Notes (Signed)
This nurse called report to [facility] and spoke with Cleora Fleet, LPN. All questions answered regarding patient.

## 2023-04-20 NOTE — Plan of Care (Signed)
  Problem: Education: Goal: Knowledge of the prescribed therapeutic regimen will improve Outcome: Adequate for Discharge   Problem: Clinical Measurements: Goal: Usual level of consciousness will be regained or maintained. Outcome: Adequate for Discharge Goal: Neurologic status will improve Outcome: Adequate for Discharge Goal: Ability to maintain intracranial pressure will improve Outcome: Adequate for Discharge   Problem: Skin Integrity: Goal: Demonstration of wound healing without infection will improve Outcome: Adequate for Discharge   Problem: Education: Goal: Knowledge of General Education information will improve Description: Including pain rating scale, medication(s)/side effects and non-pharmacologic comfort measures Outcome: Adequate for Discharge   Problem: Health Behavior/Discharge Planning: Goal: Ability to manage health-related needs will improve Outcome: Adequate for Discharge   Problem: Clinical Measurements: Goal: Ability to maintain clinical measurements within normal limits will improve Outcome: Adequate for Discharge Goal: Will remain free from infection Outcome: Adequate for Discharge Goal: Diagnostic test results will improve Outcome: Adequate for Discharge Goal: Cardiovascular complication will be avoided Outcome: Adequate for Discharge   Problem: Activity: Goal: Risk for activity intolerance will decrease Outcome: Adequate for Discharge   Problem: Nutrition: Goal: Adequate nutrition will be maintained Outcome: Adequate for Discharge   Problem: Coping: Goal: Level of anxiety will decrease Outcome: Adequate for Discharge   Problem: Elimination: Goal: Will not experience complications related to bowel motility Outcome: Adequate for Discharge Goal: Will not experience complications related to urinary retention Outcome: Adequate for Discharge   Problem: Pain Managment: Goal: General experience of comfort will improve Outcome: Adequate for  Discharge   Problem: Safety: Goal: Ability to remain free from injury will improve Outcome: Adequate for Discharge   Problem: Skin Integrity: Goal: Risk for impaired skin integrity will decrease Outcome: Adequate for Discharge
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
